# Patient Record
Sex: Male | Born: 1955 | Race: White | Hispanic: No | Marital: Married | Smoking: Former smoker
Health system: Southern US, Community
[De-identification: ages and names within clinical notes are randomized; demographics above are authoritative.]

## PROBLEM LIST (undated history)

## (undated) ENCOUNTER — Emergency Department: Admission: EM | Payer: Self-pay | Source: Home / Self Care

## (undated) DIAGNOSIS — I4891 Unspecified atrial fibrillation: Secondary | ICD-10-CM

## (undated) DIAGNOSIS — E785 Hyperlipidemia, unspecified: Secondary | ICD-10-CM

## (undated) DIAGNOSIS — I2510 Atherosclerotic heart disease of native coronary artery without angina pectoris: Secondary | ICD-10-CM

## (undated) DIAGNOSIS — G20 Parkinson's disease: Secondary | ICD-10-CM

## (undated) DIAGNOSIS — E538 Deficiency of other specified B group vitamins: Secondary | ICD-10-CM

## (undated) DIAGNOSIS — G20A1 Parkinson's disease without dyskinesia, without mention of fluctuations: Secondary | ICD-10-CM

## (undated) DIAGNOSIS — F419 Anxiety disorder, unspecified: Secondary | ICD-10-CM

## (undated) HISTORY — DX: Unspecified atrial fibrillation: I48.91

## (undated) HISTORY — PX: CARDIAC CATHETERIZATION: SHX172

---

## 2020-07-15 ENCOUNTER — Emergency Department: Payer: 59

## 2020-07-15 ENCOUNTER — Encounter: Payer: Self-pay | Admitting: Emergency Medicine

## 2020-07-15 ENCOUNTER — Emergency Department
Admission: EM | Admit: 2020-07-15 | Discharge: 2020-07-15 | Disposition: A | Discharge status: Skilled Nursing Facility | Payer: 59 | Attending: Emergency Medicine | Admitting: Emergency Medicine

## 2020-07-15 ENCOUNTER — Other Ambulatory Visit: Payer: Self-pay

## 2020-07-15 DIAGNOSIS — Y939 Activity, unspecified: Secondary | ICD-10-CM | POA: Insufficient documentation

## 2020-07-15 DIAGNOSIS — Z87891 Personal history of nicotine dependence: Secondary | ICD-10-CM | POA: Insufficient documentation

## 2020-07-15 DIAGNOSIS — Z79899 Other long term (current) drug therapy: Secondary | ICD-10-CM | POA: Diagnosis not present

## 2020-07-15 DIAGNOSIS — Y929 Unspecified place or not applicable: Secondary | ICD-10-CM | POA: Diagnosis not present

## 2020-07-15 DIAGNOSIS — R410 Disorientation, unspecified: Secondary | ICD-10-CM

## 2020-07-15 DIAGNOSIS — Y999 Unspecified external cause status: Secondary | ICD-10-CM | POA: Diagnosis not present

## 2020-07-15 DIAGNOSIS — F329 Major depressive disorder, single episode, unspecified: Secondary | ICD-10-CM | POA: Diagnosis not present

## 2020-07-15 DIAGNOSIS — I2510 Atherosclerotic heart disease of native coronary artery without angina pectoris: Secondary | ICD-10-CM | POA: Insufficient documentation

## 2020-07-15 DIAGNOSIS — R4182 Altered mental status, unspecified: Secondary | ICD-10-CM | POA: Diagnosis present

## 2020-07-15 DIAGNOSIS — W06XXXA Fall from bed, initial encounter: Secondary | ICD-10-CM | POA: Diagnosis not present

## 2020-07-15 DIAGNOSIS — F12121 Cannabis abuse with intoxication delirium: Secondary | ICD-10-CM | POA: Insufficient documentation

## 2020-07-15 DIAGNOSIS — F12921 Cannabis use, unspecified with intoxication delirium: Secondary | ICD-10-CM

## 2020-07-15 DIAGNOSIS — F32A Depression, unspecified: Secondary | ICD-10-CM

## 2020-07-15 DIAGNOSIS — R000 Tachycardia, unspecified: Secondary | ICD-10-CM | POA: Insufficient documentation

## 2020-07-15 HISTORY — DX: Parkinson's disease without dyskinesia, without mention of fluctuations: G20.A1

## 2020-07-15 HISTORY — DX: Atherosclerotic heart disease of native coronary artery without angina pectoris: I25.10

## 2020-07-15 HISTORY — DX: Parkinson's disease: G20

## 2020-07-15 LAB — CBC WITH DIFFERENTIAL/PLATELET
Abs Immature Granulocytes: 0.02 10*3/uL (ref 0.00–0.07)
Basophils Absolute: 0 10*3/uL (ref 0.0–0.1)
Basophils Relative: 0 %
Eosinophils Absolute: 0 10*3/uL (ref 0.0–0.5)
Eosinophils Relative: 0 %
HCT: 41.8 % (ref 39.0–52.0)
Hemoglobin: 13.5 g/dL (ref 13.0–17.0)
Immature Granulocytes: 0 %
Lymphocytes Relative: 10 %
Lymphs Abs: 0.8 10*3/uL (ref 0.7–4.0)
MCH: 29.3 pg (ref 26.0–34.0)
MCHC: 32.3 g/dL (ref 30.0–36.0)
MCV: 90.7 fL (ref 80.0–100.0)
Monocytes Absolute: 0.5 10*3/uL (ref 0.1–1.0)
Monocytes Relative: 6 %
Neutro Abs: 7 10*3/uL (ref 1.7–7.7)
Neutrophils Relative %: 84 %
Platelets: 238 10*3/uL (ref 150–400)
RBC: 4.61 MIL/uL (ref 4.22–5.81)
RDW: 13.2 % (ref 11.5–15.5)
WBC: 8.4 10*3/uL (ref 4.0–10.5)
nRBC: 0 % (ref 0.0–0.2)

## 2020-07-15 LAB — URINE DRUG SCREEN, QUALITATIVE (ARMC ONLY)
Amphetamines, Ur Screen: NOT DETECTED
Barbiturates, Ur Screen: NOT DETECTED
Benzodiazepine, Ur Scrn: NOT DETECTED
Cannabinoid 50 Ng, Ur ~~LOC~~: POSITIVE — AB
Cocaine Metabolite,Ur ~~LOC~~: NOT DETECTED
MDMA (Ecstasy)Ur Screen: NOT DETECTED
Methadone Scn, Ur: NOT DETECTED
Opiate, Ur Screen: NOT DETECTED
Phencyclidine (PCP) Ur S: NOT DETECTED
Tricyclic, Ur Screen: NOT DETECTED

## 2020-07-15 LAB — URINALYSIS, COMPLETE (UACMP) WITH MICROSCOPIC
Bacteria, UA: NONE SEEN
Bilirubin Urine: NEGATIVE
Glucose, UA: NEGATIVE mg/dL
Hgb urine dipstick: NEGATIVE
Ketones, ur: NEGATIVE mg/dL
Leukocytes,Ua: NEGATIVE
Nitrite: NEGATIVE
Protein, ur: NEGATIVE mg/dL
Specific Gravity, Urine: 1.015 (ref 1.005–1.030)
Squamous Epithelial / HPF: NONE SEEN (ref 0–5)
pH: 7 (ref 5.0–8.0)

## 2020-07-15 LAB — COMPREHENSIVE METABOLIC PANEL
ALT: 6 U/L (ref 0–44)
AST: 14 U/L — ABNORMAL LOW (ref 15–41)
Albumin: 4 g/dL (ref 3.5–5.0)
Alkaline Phosphatase: 63 U/L (ref 38–126)
Anion gap: 11 (ref 5–15)
BUN: 18 mg/dL (ref 8–23)
CO2: 28 mmol/L (ref 22–32)
Calcium: 9.3 mg/dL (ref 8.9–10.3)
Chloride: 101 mmol/L (ref 98–111)
Creatinine, Ser: 0.81 mg/dL (ref 0.61–1.24)
GFR calc Af Amer: >60 mL/min (ref >60)
GFR calc non Af Amer: >60 mL/min (ref >60)
Glucose, Bld: 107 mg/dL — ABNORMAL HIGH (ref 70–99)
Potassium: 3.8 mmol/L (ref 3.5–5.1)
Sodium: 140 mmol/L (ref 135–145)
Total Bilirubin: 1 mg/dL (ref 0.3–1.2)
Total Protein: 7.3 g/dL (ref 6.5–8.1)

## 2020-07-15 LAB — LACTIC ACID, PLASMA: Lactic Acid, Venous: 1.2 mmol/L (ref 0.5–1.9)

## 2020-07-15 LAB — TROPONIN I (HIGH SENSITIVITY): Troponin I (High Sensitivity): 6 ng/L (ref <18)

## 2020-07-15 MED ORDER — SODIUM CHLORIDE 0.9 % IV BOLUS
1000.0000 mL | Freq: Once | INTRAVENOUS | Status: AC
  Administered 2020-07-15: 1000 mL via INTRAVENOUS

## 2020-07-15 NOTE — ED Notes (Signed)
C-COM called for transport back to [facility]

## 2020-07-15 NOTE — ED Provider Notes (Signed)
[facility] Emergency Department Provider Note ____________________________________________   First MD Initiated Contact with Patient 07/15/20 [phone]     (approximate)  I have reviewed the triage vital signs and the nursing notes.  HISTORY  Chief Complaint Altered Mental Status   HPI Corey Morrison is a 64 y.o. malewho presents to the ED for evaluation of altered mental status  Chart review indicates patient has never been in her system.  He reports a history of [name]'s disease.  Documentation from patient's facility indicates that they found a packet of 50 marijuana edible Gummies in his room yesterday morning, 5 for left in the pack of 50.  Patient reportedly found this morning altered at his facility.  They report via EMS that they want a psychiatric evaluation.  Patient initially provides very limited history due to altered mental status.  This limits my initial evaluation.  Later, as indicated below within my timestamp section, patient provides additional history about how he has been eating marijuana edibles, how he accidentally fell out of bed overnight last night.  Patient reports rolling out of bed accidentally, hitting his head on the floor.  He denies associated syncope.  Denies headache at this time.  Denies vision changes.  Later, patient is reporting vague suicidality without a plan.  Past Medical History:  Diagnosis Date  . Coronary artery disease   . [name] disease (HCC)     There are no problems to display for this patient.   History reviewed. No pertinent surgical history.  Prior to Admission medications   Medication Sig Start Date End Date Taking? Authorizing Provider  atorvastatin (LIPITOR) 40 MG tablet Take 40 mg by mouth daily.   Yes [provider]  Carbidopa-Levodopa ER (RYTARY) 61.25-245 MG CPCR Take by mouth.   Yes [provider]  prasugrel (EFFIENT) 5 MG TABS tablet Take 5 mg by mouth daily.   Yes  [provider]  ropinirole (REQUIP) 5 MG tablet Take 5 mg by mouth at bedtime.   Yes [provider]    Allergies Patient has no known allergies.  History reviewed. No pertinent family history.  Social History Social History   Tobacco Use  . Smoking status: Former Games developer  . Smokeless tobacco: Never Used  Substance Use Topics  . Alcohol use: Not on file  . Drug use: Not on file    Review of Systems  Unable to be obtained during my initial evaluation due to patient's altered mental status. ____________________________________________   PHYSICAL EXAM:  VITAL SIGNS: Vitals:   07/15/20 0908 07/15/20 1237  BP: 128/78 (!) 161/83  Pulse: (!) 101 97  Resp: 18 13  Temp: 98 F (36.7 C)   SpO2: 98% 100%      Constitutional: Alert.  Sitting up in bed, well-appearing without distress.  Follows commands in all 4 extremities.  Nonverbal upon my initial evaluation, and upon subsequent evaluations patient is sitting up and conversational in full sentences.. Eyes: Conjunctivae are normal. PERRL. EOMI. Head: Atraumatic. Nose: No congestion/rhinnorhea. Mouth/Throat: Mucous membranes are moist.  Oropharynx non-erythematous. Neck: No stridor. No cervical spine tenderness to palpation. Cardiovascular: Normal rate, regular rhythm. Grossly normal heart sounds.  Good peripheral circulation. Respiratory: Normal respiratory effort.  No retractions. Lungs CTAB. Gastrointestinal: Soft , nondistended, nontender to palpation. No abdominal bruits. No CVA tenderness. Musculoskeletal: No lower extremity tenderness nor edema.  No joint effusions. No signs of acute trauma. Slow movements and cogwheel rigidity is present. Neurologic:  Normal speech and language. No  gross focal neurologic deficits are appreciated. No gait instability noted. Cranial nerves II through XII intact 5/5 strength and sensation in all 4 extremities Skin:  Skin is warm, dry and intact. No rash  noted. Psychiatric: Mood and affect are normal. Speech and behavior are normal.  ____________________________________________   LABS (all labs ordered are listed, but only abnormal results are displayed)  Labs Reviewed  COMPREHENSIVE METABOLIC PANEL - Abnormal; Notable for the following components:      Result Value   Glucose, Bld 107 (*)    AST 14 (*)    All other components within normal limits  URINALYSIS, COMPLETE (UACMP) WITH MICROSCOPIC - Abnormal; Notable for the following components:   Color, Urine YELLOW (*)    APPearance HAZY (*)    All other components within normal limits  URINE DRUG SCREEN, QUALITATIVE ([facility] ONLY) - Abnormal; Notable for the following components:   Cannabinoid 50 Ng, Ur [place] POSITIVE (*)    All other components within normal limits  LACTIC ACID, PLASMA  CBC WITH DIFFERENTIAL/PLATELET  CBC WITH DIFFERENTIAL/PLATELET  TROPONIN I (HIGH SENSITIVITY)   ____________________________________________  12 Lead EKG  Sinus rhythm, rate of 107 bpm, normal axis.  LBBB.  No evidence of acute ischemia per Sgarbossa's criteria ____________________________________________  RADIOLOGY  ED MD interpretation: No evidence of acute cardiopulmonary pathology on CXR  Official radiology report(s): CT Head Wo Contrast  Result Date: 07/15/2020 CLINICAL DATA:  Altered mental status EXAM: CT HEAD WITHOUT CONTRAST TECHNIQUE: Contiguous axial images were obtained from the base of the skull through the vertex without intravenous contrast. COMPARISON:  None. FINDINGS: Brain: The ventricles are normal in size and configuration. There is slight frontal atrophy bilaterally, symmetric. There is no intracranial mass, hemorrhage, extra-axial fluid collection, or midline shift. Brain parenchyma appears unremarkable. No demonstrable acute infarct. Vascular: No evident hyperdense vessel. There is calcification in the left carotid siphon region. Skull: The bony calvarium appears intact.  Sinuses/Orbits: There is mild mucosal thickening in several ethmoid air cells. Other visualized paranasal sinuses are clear. Orbits appear symmetric bilaterally. Other: Mastoid air cells are clear. IMPRESSION: Slight frontal atrophy bilaterally. Ventricles normal in size and configuration. Brain parenchyma appears unremarkable. No mass or hemorrhage. Mild arterial vascular calcification. Mild mucosal thickening in several ethmoid air cells. Electronically Signed   By: Bretta Bang III M.D.   On: 07/15/2020 09:36   DG Chest Portable 1 View  Result Date: 07/15/2020 CLINICAL DATA:  Tachycardia with altered mental status EXAM: PORTABLE CHEST 1 VIEW COMPARISON:  None. FINDINGS: Lungs are clear. Heart size and pulmonary vascularity are normal. No adenopathy. There is an old healed fracture of the posterior right seventh rib. No pneumothorax. IMPRESSION: Lungs clear.  Cardiac silhouette normal. Electronically Signed   By: Bretta Bang III M.D.   On: 07/15/2020 09:44    ____________________________________________   PROCEDURES and INTERVENTIONS  Procedure(s) performed (including Critical Care):  Procedures  Medications  sodium chloride 0.9 % bolus 1,000 mL (0 mLs Intravenous Stopped 07/15/20 1107)    ____________________________________________   INITIAL IMPRESSION / ASSESSMENT AND PLAN / ED COURSE  64 year old male with [name]'s been to the ED for evaluation of altered mental status, likely due to cannabis intoxication, subsequently telling us he has vague suicidality without a plan requiring psychiatric evaluation.  Normal vital signs on room air.  Exam initially with mute patient who has no evidence of distress, trauma or focal neurologic deficits.  Blood work unremarkable without evidence of acute derangements.  Urine testing demonstrates UDS  positive for cannabis, consistent with story that we received prehospital of marijuana edibles being found on the patient this morning.  CXR  and CT head without evidence of acute pathology.  Patient's mental status clearing while he is in the ED and he eventually does describe some vague suicidality due to his decreased capabilities in the setting of [name]'s disease.  He has no plan and has never attempted suicide in the past, but he is requesting evaluation by psychiatry, to think is reasonable.  Due to his lack of plan and only vague thoughts, IVC was not taken out on this patient.  Patient signed out to oncoming physician Dr. Larinda Buttery to follow-up on evaluation by psychiatry/TTS of patient's mental status.  Medically, he is cleared and safe for discharge per my evaluation.  Clinical Course as of Jul 15 1526  Wed Jul 15, 2020  1011 Reassessed.  Patient sitting up in bed and has sharper mentation.  He reports falling out of bed this morning and hitting his head.  Reports eating marijuana edibles last night and the night before.   [DS]  1139 Patient requesting psychiatric evaluation.  TTS/psychiatry orders placed.  Medically clear from my perspective.   [DS]  1346 Reassessed.  Still waiting for psychiatry/TTS, I go to the bedside and discuss his concerns.  While he was initially not given me much information to work with regarding why he wanted to talk to them, reports that he has been having some vague suicidality over the past few weeks.  Reports thinking about suicide almost daily now, but has formulated no plan.   [DS]    Clinical Course User Index [DS] Delton Prairie, MD     ____________________________________________   FINAL CLINICAL IMPRESSION(S) / ED DIAGNOSES  Final diagnoses:  Confusion  Cannabis intoxication with delirium (HCC)  Depression, unspecified depression type     ED Discharge Orders    None       Jaloni Sorber   Note:  This document was prepared using Dragon voice recognition software and may include unintentional dictation errors.   Delton Prairie, MD 07/15/20 [phone]

## 2020-07-15 NOTE — ED Notes (Signed)
Pt requesting to speak to psychiatrist. Pt denies SI/HI at this time. MD made aware. No new orders at this time

## 2020-07-15 NOTE — ED Notes (Signed)
C-COM called for transport back to [facility] 

## 2020-07-15 NOTE — ED Notes (Signed)
This RN unable to find psych MD or reach him by phone at this time

## 2020-07-15 NOTE — ED Notes (Signed)
This RN at bedside. Pt asking when psych will be to see him. This RN assured made the psychologist is aware of pt's situation and will be in as soon as he can. Pt verbalizes understanding.

## 2020-07-15 NOTE — ED Provider Notes (Signed)
-----------------------------------------   3:10 PM on 07/15/2020 -----------------------------------------  Blood pressure (!) 161/83, pulse 97, temperature 98 F (36.7 C), temperature source Oral, resp. rate 13, height 5\' 9"  (1.753 m), weight 65.8 kg, SpO2 100 %.  Assuming care from Dr. .  In short, Corey Morrison is a 64 y.o. male with a chief complaint of Altered Mental Status .  Refer to the original H&P for additional details.  The current plan of care is to follow-up psychiatry recommendations for passive SI.  ----------------------------------------- 4:52 PM on 07/15/2020 -----------------------------------------  On reassessment, patient is now requesting to be discharged home.  He currently denies any suicidal ideation, states "it was a brief thing, but I want to live."  He was offered psychiatric evaluation, but declines at this time and given he does not represent an acute threat to himself or others, he is appropriate for discharge home.  He was advised to avoid marijuana in the future due to this episode.  He was provided with referral to [facility] and counseled to return to the ED for new worsening symptoms, patient agrees with plan.    07/17/2020, MD 07/15/20 [phone]

## 2020-07-15 NOTE — ED Notes (Signed)
This RN and Joni Reining, EDT at bedside assisting pt to bathroom. Psych MD stating he would could come back once done.

## 2020-07-15 NOTE — ED Notes (Signed)
Pt given meal tray and water at this time 

## 2020-07-15 NOTE — ED Triage Notes (Signed)
Presents via EMS from [facility]  Per EMS the staff found him altered    Speech is muffled at presents but will answer questions

## 2020-07-15 NOTE — ED Notes (Signed)
This RN visualized pt leaving room and trying to use fire exit door. This RN and Val, RN assisted pt back to bed. Pt with very unsteady gait. This RN and Val, RN had to to prevent pt from falling twice. Pt high fall risk. Charge RN called to move pt to 12H for better visualization until EMS can transport pt.

## 2021-04-03 IMAGING — CR DG CHEST 1V PORT
1 series · 1 of 1 positions shown · non-contrast
Comparison: None.

CLINICAL DATA: Tachycardia with altered mental status

EXAM:
PORTABLE CHEST 1 VIEW

[x chest ap]
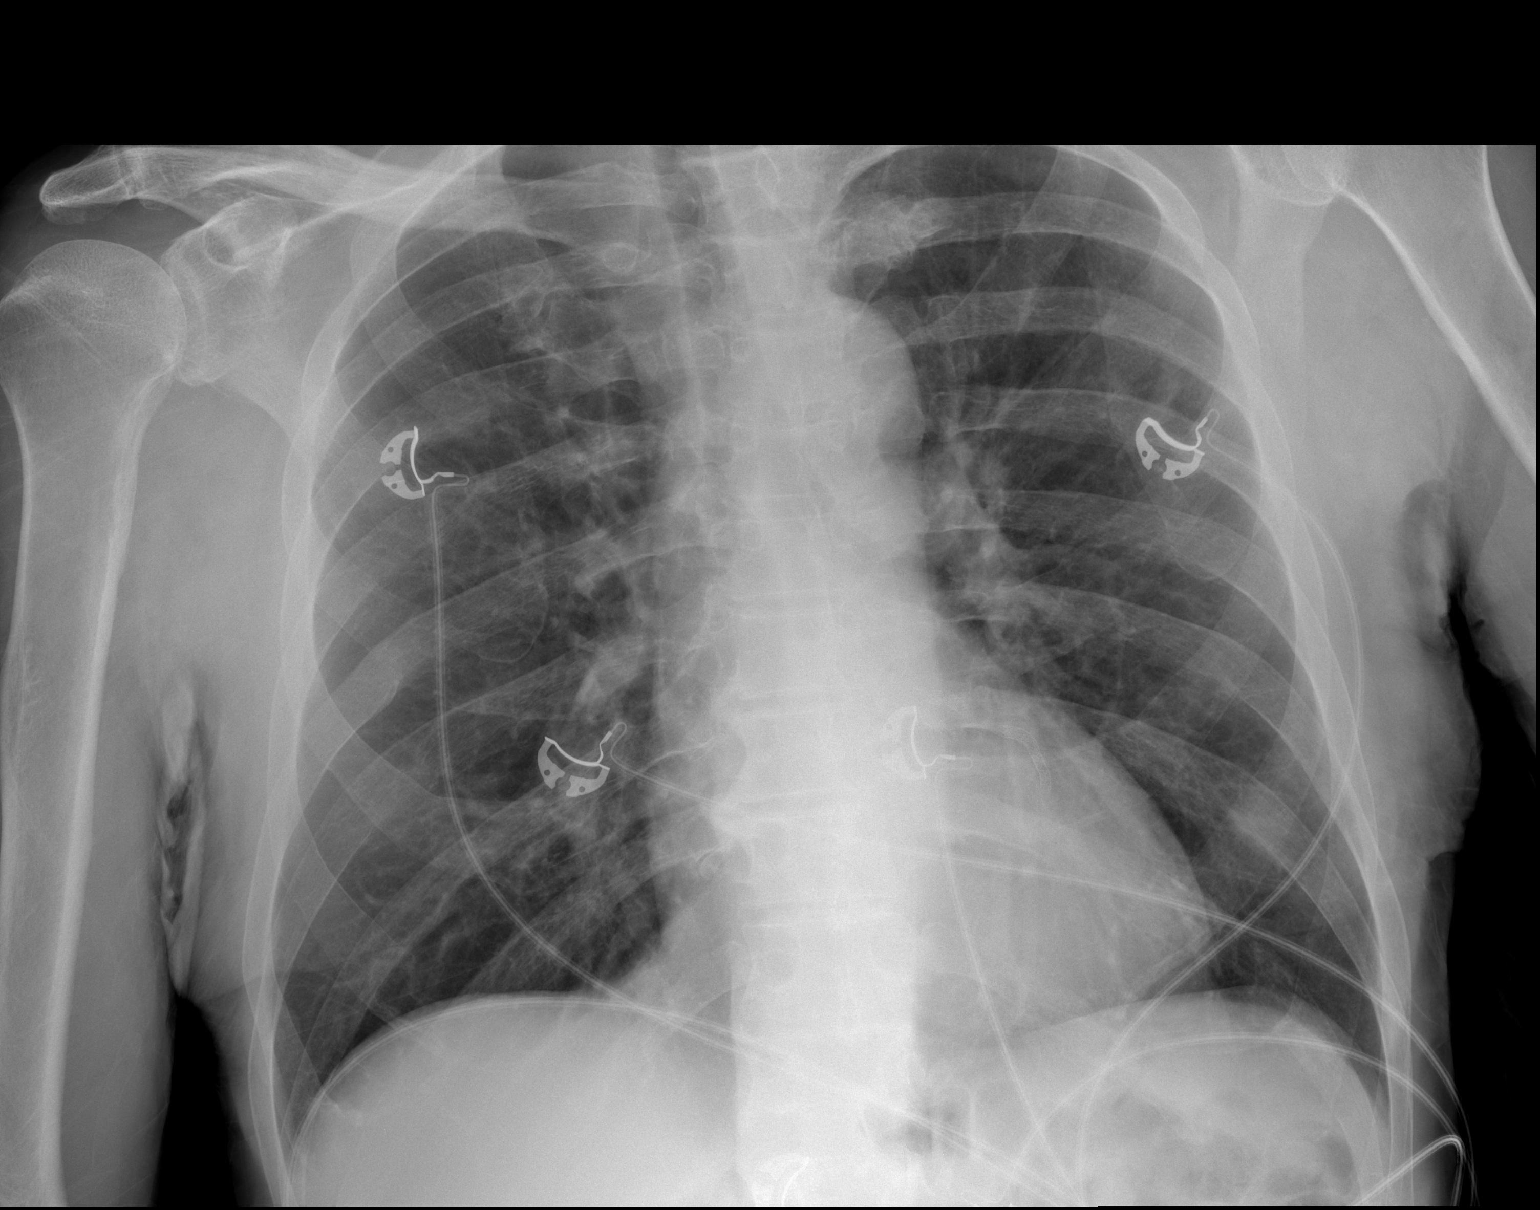

[1 of 1 positions shown; findings below may reference images not displayed]

FINDINGS: Lungs are clear. Heart size and pulmonary vascularity are normal. No
adenopathy. There is an old healed fracture of the posterior right
seventh rib. No pneumothorax.
IMPRESSION: Lungs clear.  Cardiac silhouette normal.

## 2021-07-26 ENCOUNTER — Ambulatory Visit (INDEPENDENT_AMBULATORY_CARE_PROVIDER_SITE_OTHER): Payer: Medicare Other | Admitting: Cardiology

## 2021-07-26 ENCOUNTER — Encounter: Payer: Self-pay | Admitting: Cardiology

## 2021-07-26 ENCOUNTER — Other Ambulatory Visit: Payer: Self-pay

## 2021-07-26 VITALS — BP 111/45 | HR 79 | Ht 69.0 in | Wt 113.2 lb

## 2021-07-26 DIAGNOSIS — I2510 Atherosclerotic heart disease of native coronary artery without angina pectoris: Secondary | ICD-10-CM

## 2021-07-26 DIAGNOSIS — G249 Dystonia, unspecified: Secondary | ICD-10-CM

## 2021-07-26 DIAGNOSIS — E7800 Pure hypercholesterolemia, unspecified: Secondary | ICD-10-CM | POA: Diagnosis not present

## 2021-07-26 MED ORDER — EZETIMIBE 10 MG PO TABS: 10.0000 mg | ORAL_TABLET | Freq: Every day | ORAL | 11 refills | Status: DC

## 2021-07-26 NOTE — Progress Notes (Signed)
Cardiology Office Note:    Date:  07/26/2021   ID:  Corey Morrison, DOB 1956/06/28, MRN [id number]  PCP:  Almetta Lovely, [facility]   [facility] Providers Cardiologist:  None     Referring MD: Melonie Florida, FNP   Chief Complaint  Patient presents with   Other    Hx ASCVD please discuss d/c statin drug. Meds reviewed verbally with pt.    History of Present Illness:    Corey Morrison is a 65 y.o. male with a hx of CAD/PCI (DES x 2 to prox and mid LAD 05/2020), hyperlipidemia, former smoker, [name]'s presenting due to history of CAD.  Previously seen at [facility] in [place] from a cardiac perspective.  April 2021 he was noted to have an abnormal stress test.  Underwent a coronary CTA which showed mid LAD stenosis.  Left heart cath 06/05/2020 showed significant stenosis in the mid LAD.  Notes indicate a history of atrial fibrillation, not on anticoagulation probably because of fall risk.  Echocardiogram 02/2020 showed normal EF 57%.  He currently resides in a skilled nursing facility.  He is being followed by neurology, has several neurologic medications being used for [name]'s, dyskinesia.  Was told he needed to stop statin due to drug drug interaction.  Was told he needs to stop his other cardiac medications.  He currently takes 3 medicines from a cardiac perspective including aspirin, Effient, Lipitor.  Past Medical History:  Diagnosis Date   A-fib [facility])    Coronary artery disease    [name] disease (HCC)     Past Surgical History:  Procedure Laterality Date   CARDIAC CATHETERIZATION      Current Medications: Current Meds  Medication Sig   acetaminophen (TYLENOL) 325 MG tablet TAKE 2 TABLETS BY MOUTH EVERY 6 HOURS ASNEEDED FOR PAIN   aspirin 81 MG EC tablet Take 1 tablet by mouth daily.   Carbidopa-Levodopa ER (RYTARY) 61.25-245 MG CPCR Take by mouth.   citalopram (CELEXA) 10 MG tablet Take 10 mg for one week, then increase to 20 mg for one week, then 30 mg  for one week, then 40 mg and continue.   entacapone (COMTAN) 200 MG tablet Take by mouth.   ezetimibe (ZETIA) 10 MG tablet Take 1 tablet (10 mg total) by mouth daily.   QUEtiapine (SEROQUEL) 25 MG tablet Take by mouth.   ropinirole (REQUIP) 5 MG tablet Take 5 mg by mouth at bedtime.   [DISCONTINUED] atorvastatin (LIPITOR) 40 MG tablet Take 40 mg by mouth daily.   [DISCONTINUED] prasugrel (EFFIENT) 5 MG TABS tablet Take 5 mg by mouth daily.     Allergies:   Patient has no known allergies.   Social History   Socioeconomic History   Marital status: Married    Spouse name: Not on file   Number of children: Not on file   Years of education: Not on file   Highest education level: Not on file  Occupational History   Not on file  Tobacco Use   Smoking status: Former   Smokeless tobacco: Never  Substance and Sexual Activity   Alcohol use: Never   Drug use: Never   Sexual activity: Not Currently  Other Topics Concern   Not on file  Social History Narrative   Not on file   Social Determinants of Health   Financial Resource Strain: Not on file  Food Insecurity: Not on file  Transportation Needs: Not on file  Physical Activity: Not on file  Stress: Not on file  Social Connections: Not on file     Family History: The patient's family history includes Heart Problems in his father.  ROS:   Please see the history of present illness.     All other systems reviewed and are negative.  EKGs/Labs/Other Studies Reviewed:    The following studies were reviewed today:   EKG:  EKG is  ordered today.  The ekg ordered today demonstrates sinus rhythm, bundle branch block  Recent Labs: No results found for requested labs within last 8760 hours.  Recent Lipid Panel No results found for: CHOL, TRIG, HDL, CHOLHDL, VLDL, LDLCALC, LDLDIRECT   Risk Assessment/Calculations:          Physical Exam:    VS:  BP (!) 111/45 (BP Location: Left Arm, Patient Position: Sitting, Cuff Size:  Normal)   Pulse 79   Ht 5\' 9"  (1.753 m)   Wt 113 lb 4 oz (51.4 kg)   SpO2 96%   BMI 16.72 kg/m     Wt Readings from Last 3 Encounters:  07/26/21 113 lb 4 oz (51.4 kg)  07/15/20 145 lb (65.8 kg)     GEN: Dyskinesis severe, appears cachectic HEENT: Normal NECK: No JVD; No carotid bruits LYMPHATICS: No lymphadenopathy CARDIAC: RRR, no murmurs, rubs, gallops RESPIRATORY:  Clear to auscultation without rales, wheezing or rhonchi  ABDOMEN: Soft, non-tender, non-distended MUSCULOSKELETAL:  No edema; No deformity  SKIN: Warm and dry NEUROLOGIC:  Alert, choreiform movements PSYCHIATRIC: Disconnected,  ASSESSMENT:    1. Coronary artery disease involving native coronary artery of native heart without angina pectoris   2. Pure hypercholesterolemia   3. Dyskinesia    PLAN:    In order of problems listed above:  CAD/PCI x2 to mid LAD.  Continue aspirin, okay to stop Effient.  Request to stop statin for neurology due to drug interactions.  Okay to stop Lipitor, start Zetia 10 mg daily.  Last echo with preserved ejection fraction, EF 57%. Hyperlipidemia, start Zetia as above. Dyskinesia, choreiform movements.  Management as per neurology.  Follow-up in 1 year after repeat echocardiogram.     Medication Adjustments/Labs and Tests Ordered: Current medicines are reviewed at length with the patient today.  Concerns regarding medicines are outlined above.  Orders Placed This Encounter  Procedures   EKG 12-Lead   ECHOCARDIOGRAM COMPLETE   Meds ordered this encounter  Medications   ezetimibe (ZETIA) 10 MG tablet    Sig: Take 1 tablet (10 mg total) by mouth daily.    Dispense:  30 tablet    Refill:  11    Patient Instructions  Medication Instructions:   Your physician has recommended you make the following change in your medication:    STOP taking your Prasurgel (Effient)  2.    STOP taking Lipitor (Atorvastatin)  3.    START taking Zetia (Ezetimibe) 10 MG once a  day.  *If you need a refill on your cardiac medications before your next appointment, please call your pharmacy*   Lab Work: None ordered If you have labs (blood work) drawn today and your tests are completely normal, you will receive your results only by: MyChart Message (if you have MyChart) OR A paper copy in the mail If you have any lab test that is abnormal or we need to change your treatment, we will call you to review the results.   Testing/Procedures:  Your physician has requested that you have an echocardiogram in 1 year. Echocardiography is a painless test that uses sound waves to create  images of your heart. It provides your doctor with information about the size and shape of your heart and how well your heart's chambers and valves are working. This procedure takes approximately one hour. There are no restrictions for this procedure.    Follow-Up: At [facility], you and your health needs are our priority.  As part of our continuing mission to provide you with exceptional heart care, we have created designated Provider Care Teams.  These Care Teams include your primary Cardiologist (physician) and Advanced Practice Providers (APPs -  Physician Assistants and Nurse Practitioners) who all work together to provide you with the care you need, when you need it.  We recommend signing up for the patient portal called "MyChart".  Sign up information is provided on this After Visit Summary.  MyChart is used to connect with patients for Virtual Visits (Telemedicine).  Patients are able to view lab/test results, encounter notes, upcoming appointments, etc.  Non-urgent messages can be sent to your provider as well.   To learn more about what you can do with MyChart, go to [email or web address].    Your next appointment:   1 year(s)  The format for your next appointment:   In Person  Provider:   Debbe Odea, MD   Other Instructions    Signed, Debbe Odea, MD   07/26/2021 5:15 PM    [facility]

## 2021-07-26 NOTE — Patient Instructions (Signed)
Medication Instructions:   Your physician has recommended you make the following change in your medication:    STOP taking your Prasurgel (Effient)  2.    STOP taking Lipitor (Atorvastatin)  3.    START taking Zetia (Ezetimibe) 10 MG once a day.  *If you need a refill on your cardiac medications before your next appointment, please call your pharmacy*   Lab Work: None ordered If you have labs (blood work) drawn today and your tests are completely normal, you will receive your results only by: MyChart Message (if you have MyChart) OR A paper copy in the mail If you have any lab test that is abnormal or we need to change your treatment, we will call you to review the results.   Testing/Procedures:  Your physician has requested that you have an echocardiogram in 1 year. Echocardiography is a painless test that uses sound waves to create images of your heart. It provides your doctor with information about the size and shape of your heart and how well your heart's chambers and valves are working. This procedure takes approximately one hour. There are no restrictions for this procedure.    Follow-Up: At [facility], you and your health needs are our priority.  As part of our continuing mission to provide you with exceptional heart care, we have created designated Provider Care Teams.  These Care Teams include your primary Cardiologist (physician) and Advanced Practice Providers (APPs -  Physician Assistants and Nurse Practitioners) who all work together to provide you with the care you need, when you need it.  We recommend signing up for the patient portal called "MyChart".  Sign up information is provided on this After Visit Summary.  MyChart is used to connect with patients for Virtual Visits (Telemedicine).  Patients are able to view lab/test results, encounter notes, upcoming appointments, etc.  Non-urgent messages can be sent to your provider as well.   To learn more about what you  can do with MyChart, go to [email or web address].    Your next appointment:   1 year(s)  The format for your next appointment:   In Person  Provider:   Debbe Odea, MD   Other Instructions

## 2021-11-08 ENCOUNTER — Emergency Department
Admission: EM | Admit: 2021-11-08 | Discharge: 2021-11-09 | Disposition: A | Discharge status: Other Acute Inpatient Hospital | Payer: Medicare Other | Attending: Emergency Medicine | Admitting: Emergency Medicine

## 2021-11-08 ENCOUNTER — Other Ambulatory Visit: Payer: Self-pay

## 2021-11-08 DIAGNOSIS — Z7982 Long term (current) use of aspirin: Secondary | ICD-10-CM | POA: Diagnosis not present

## 2021-11-08 DIAGNOSIS — R440 Auditory hallucinations: Secondary | ICD-10-CM | POA: Insufficient documentation

## 2021-11-08 DIAGNOSIS — G20 Parkinson's disease: Secondary | ICD-10-CM | POA: Insufficient documentation

## 2021-11-08 DIAGNOSIS — R45851 Suicidal ideations: Secondary | ICD-10-CM | POA: Diagnosis not present

## 2021-11-08 DIAGNOSIS — Z79899 Other long term (current) drug therapy: Secondary | ICD-10-CM | POA: Diagnosis not present

## 2021-11-08 DIAGNOSIS — F29 Unspecified psychosis not due to a substance or known physiological condition: Secondary | ICD-10-CM | POA: Diagnosis present

## 2021-11-08 DIAGNOSIS — Z87891 Personal history of nicotine dependence: Secondary | ICD-10-CM | POA: Insufficient documentation

## 2021-11-08 DIAGNOSIS — Z20822 Contact with and (suspected) exposure to covid-19: Secondary | ICD-10-CM | POA: Insufficient documentation

## 2021-11-08 DIAGNOSIS — I2510 Atherosclerotic heart disease of native coronary artery without angina pectoris: Secondary | ICD-10-CM | POA: Diagnosis not present

## 2021-11-08 HISTORY — DX: Hyperlipidemia, unspecified: E78.5

## 2021-11-08 HISTORY — DX: Anxiety disorder, unspecified: F41.9

## 2021-11-08 HISTORY — DX: Deficiency of other specified B group vitamins: E53.8

## 2021-11-08 LAB — RESP PANEL BY RT-PCR (FLU A&B, COVID) ARPGX2
Influenza A by PCR: NEGATIVE
Influenza B by PCR: NEGATIVE
SARS Coronavirus 2 by RT PCR: NEGATIVE

## 2021-11-08 LAB — CBC
HCT: 31.1 % — ABNORMAL LOW (ref 39.0–52.0)
Hemoglobin: 9.8 g/dL — ABNORMAL LOW (ref 13.0–17.0)
MCH: 30.2 pg (ref 26.0–34.0)
MCHC: 31.5 g/dL (ref 30.0–36.0)
MCV: 95.7 fL (ref 80.0–100.0)
Platelets: 246 10*3/uL (ref 150–400)
RBC: 3.25 MIL/uL — ABNORMAL LOW (ref 4.22–5.81)
RDW: 15.2 % (ref 11.5–15.5)
WBC: 5.4 10*3/uL (ref 4.0–10.5)
nRBC: 0 % (ref 0.0–0.2)

## 2021-11-08 LAB — URINE DRUG SCREEN, QUALITATIVE (ARMC ONLY)
Amphetamines, Ur Screen: NOT DETECTED
Barbiturates, Ur Screen: NOT DETECTED
Benzodiazepine, Ur Scrn: NOT DETECTED
Cannabinoid 50 Ng, Ur ~~LOC~~: POSITIVE — AB
Cocaine Metabolite,Ur ~~LOC~~: NOT DETECTED
MDMA (Ecstasy)Ur Screen: NOT DETECTED
Methadone Scn, Ur: NOT DETECTED
Opiate, Ur Screen: NOT DETECTED
Phencyclidine (PCP) Ur S: NOT DETECTED
Tricyclic, Ur Screen: NOT DETECTED

## 2021-11-08 LAB — ETHANOL: Alcohol, Ethyl (B): <10 mg/dL (ref <10)

## 2021-11-08 LAB — COMPREHENSIVE METABOLIC PANEL
ALT: <5 U/L (ref 0–44)
AST: 14 U/L — ABNORMAL LOW (ref 15–41)
Albumin: 3.4 g/dL — ABNORMAL LOW (ref 3.5–5.0)
Alkaline Phosphatase: 104 U/L (ref 38–126)
Anion gap: 6 (ref 5–15)
BUN: 25 mg/dL — ABNORMAL HIGH (ref 8–23)
CO2: 29 mmol/L (ref 22–32)
Calcium: 8.6 mg/dL — ABNORMAL LOW (ref 8.9–10.3)
Chloride: 105 mmol/L (ref 98–111)
Creatinine, Ser: 0.74 mg/dL (ref 0.61–1.24)
GFR, Estimated: >60 mL/min (ref >60)
Glucose, Bld: 88 mg/dL (ref 70–99)
Potassium: 4 mmol/L (ref 3.5–5.1)
Sodium: 140 mmol/L (ref 135–145)
Total Bilirubin: 0.6 mg/dL (ref 0.3–1.2)
Total Protein: 6.7 g/dL (ref 6.5–8.1)

## 2021-11-08 LAB — ACETAMINOPHEN LEVEL: Acetaminophen (Tylenol), Serum: <10 ug/mL — ABNORMAL LOW (ref 10–30)

## 2021-11-08 LAB — SALICYLATE LEVEL: Salicylate Lvl: <7 mg/dL — ABNORMAL LOW (ref 7.0–30.0)

## 2021-11-08 MED ORDER — ASPIRIN EC 81 MG PO TBEC
81.0000 mg | DELAYED_RELEASE_TABLET | Freq: Every day | ORAL | Status: DC
  Administered 2021-11-09: 81 mg via ORAL
  Filled 2021-11-08: qty 1

## 2021-11-08 MED ORDER — EZETIMIBE 10 MG PO TABS
10.0000 mg | ORAL_TABLET | Freq: Every day | ORAL | Status: DC
  Administered 2021-11-09: 10 mg via ORAL
  Filled 2021-11-08: qty 1

## 2021-11-08 MED ORDER — CARBIDOPA-LEVODOPA 25-100 MG PO TABS
2.0000 | ORAL_TABLET | Freq: Once | ORAL | Status: AC
  Administered 2021-11-08: 2 via ORAL
  Filled 2021-11-08: qty 2

## 2021-11-08 MED ORDER — ENTACAPONE 200 MG PO TABS
200.0000 mg | ORAL_TABLET | Freq: Three times a day (TID) | ORAL | Status: DC
  Administered 2021-11-08 – 2021-11-09 (×3): 200 mg via ORAL
  Filled 2021-11-08 (×4): qty 1

## 2021-11-08 MED ORDER — PIMAVANSERIN TARTRATE 34 MG PO CAPS: 1.0000 | ORAL_CAPSULE | Freq: Every day | ORAL | Status: DC

## 2021-11-08 MED ORDER — SERTRALINE HCL 50 MG PO TABS
25.0000 mg | ORAL_TABLET | Freq: Every day | ORAL | Status: DC
  Administered 2021-11-08: 25 mg via ORAL
  Filled 2021-11-08: qty 1

## 2021-11-08 MED ORDER — PSYLLIUM 95 % PO PACK
1.0000 | PACK | Freq: Every day | ORAL | Status: DC
  Administered 2021-11-08: 1 via ORAL
  Filled 2021-11-08 (×2): qty 1

## 2021-11-08 MED ORDER — ROPINIROLE HCL 1 MG PO TABS
2.0000 mg | ORAL_TABLET | Freq: Four times a day (QID) | ORAL | Status: DC
  Administered 2021-11-08 – 2021-11-09 (×4): 2 mg via ORAL
  Filled 2021-11-08 (×7): qty 2

## 2021-11-08 MED ORDER — CARBIDOPA-LEVODOPA 25-100 MG PO TABS
2.0000 | ORAL_TABLET | ORAL | Status: DC
  Administered 2021-11-08 – 2021-11-09 (×5): 2 via ORAL
  Filled 2021-11-08 (×9): qty 2

## 2021-11-08 NOTE — ED Notes (Signed)
BIB friend/caregiver from [facility] due to concerns of patient acting like he was going to jump off of second floor. Pt describes as "a half ass attempt at suicide".

## 2021-11-08 NOTE — ED Notes (Signed)
Psych NP at bedside

## 2021-11-08 NOTE — ED Notes (Signed)
Snack and drink was given 

## 2021-11-08 NOTE — Consult Note (Signed)
[facility] Face-to-Face Psychiatry Consult   Reason for Consult:  suicidal thoughts Referring Physician:  Larinda Buttery Patient Identification: Corey Morrison MRN:  [id number] Principal Diagnosis: Verbal auditory hallucinations Diagnosis:  Principal Problem:   Verbal auditory hallucinations   Total Time spent with patient: 45 minutes  Subjective:   Corey Morrison is a 65 y.o. male patient admitted with suicidal thoughts in the context of AH telling him  that he is "no good."   HPI:    Past Psychiatric History: Patient was seen and chart reviewed.  Patient was brought in by a friend who stays with him in the evening.  [facility].  Patient has been diagnosed with [name] disease.  He states for the last several months he has been "hearing voices that tell me I am not good.  I also see people in my apartment."  Patient describes that there were a lot of people in his apartment that were walking around eating hamburgers.  Patient also hears voices when he tries to go to sleep at night and he says they will not stop until he gets up and starts banging dishes around.  Patient is not sure if this is real and this experience is very disturbing to him.  Today, patient was observed by staff trying to climb over a third story balcony in jumping because "the voices told me to."  Patient's states that he does not really want to die but he is afraid that these people are going to make him do it.  Patient describes that he has had very mild depression in the past, probably 20 years ago when he lived in [place], but he saw a therapist and has never been on any medicine for that until recently.  Patient sees Dr. Ardine [facility] neurology.  Patient denies any homicidal ideation but it frightens him to think that he may think somebody else is trying to harm him and he will do harm to them.  Patient seems to be aware that these occurrences of auditory and visual experiences are hallucinations and are  not real.  Patient requires psychiatric inpatient hospitalization for stabilization.  This is explained to the patient and his friend, goal-directed in agreement.  Pharmacy is working on reconciling the next medications with facility ([facility]).   Patient's friend, [name], [phone], was in attendance for evaluation.  Risk to Self:   Risk to Others:   Prior Inpatient Therapy:   Prior Outpatient Therapy:    Past Medical History:  Past Medical History:  Diagnosis Date   A-fib (HCC)    Anxiety disorder    Coronary artery disease    Hyperlipidemia    [name] disease (HCC)    Vitamin B12 deficiency     Past Surgical History:  Procedure Laterality Date   CARDIAC CATHETERIZATION     Family History:  Family History  Problem Relation Age of Onset   Heart Problems Father    Family Psychiatric  History: unknown Social History:  Social History   Substance and Sexual Activity  Alcohol Use Never     Social History   Substance and Sexual Activity  Drug Use Never    Social History   Socioeconomic History   Marital status: Married    Spouse name: Not on file   Number of children: Not on file   Years of education: Not on file   Highest education level: Not on file  Occupational History   Not on file  Tobacco Use  Smoking status: Former   Smokeless tobacco: Never  Substance and Sexual Activity   Alcohol use: Never   Drug use: Never   Sexual activity: Not Currently  Other Topics Concern   Not on file  Social History Narrative   Not on file   Social Determinants of Health   Financial Resource Strain: Not on file  Food Insecurity: Not on file  Transportation Needs: Not on file  Physical Activity: Not on file  Stress: Not on file  Social Connections: Not on file   Additional Social History:    Allergies:  No Known Allergies  Labs:  Results for orders placed or performed during the hospital encounter of 11/08/21 (from the past 48 hour(s))  Comprehensive  metabolic panel     Status: Abnormal   Collection Time: 11/08/21 11:38 AM  Result Value Ref Range   Sodium 140 135 - 145 mmol/L   Potassium 4.0 3.5 - 5.1 mmol/L   Chloride 105 98 - 111 mmol/L   CO2 29 22 - 32 mmol/L   Glucose, Bld 88 70 - 99 mg/dL    Comment: Glucose reference range applies only to samples taken after fasting for at least 8 hours.   BUN 25 (H) 8 - 23 mg/dL   Creatinine, Ser 1.01 0.61 - 1.24 mg/dL   Calcium 8.6 (L) 8.9 - 10.3 mg/dL   Total Protein 6.7 6.5 - 8.1 g/dL   Albumin 3.4 (L) 3.5 - 5.0 g/dL   AST 14 (L) 15 - 41 U/L   ALT <5 0 - 44 U/L   Alkaline Phosphatase 104 38 - 126 U/L   Total Bilirubin 0.6 0.3 - 1.2 mg/dL   GFR, Estimated >75 >10 mL/min    Comment: (NOTE) Calculated using the CKD-EPI Creatinine Equation (2021)    Anion gap 6 5 - 15    Comment: Performed at [facility], [address], [place], [place] [address]  Ethanol     Status: None   Collection Time: 11/08/21 11:38 AM  Result Value Ref Range   Alcohol, Ethyl (B) <10 <10 mg/dL    Comment: (NOTE) Lowest detectable limit for serum alcohol is 10 mg/dL.  For medical purposes only. Performed at [facility], [address] [place], [place] [address]   Salicylate level     Status: Abnormal   Collection Time: 11/08/21 11:38 AM  Result Value Ref Range   Salicylate Lvl <7.0 (L) 7.0 - 30.0 mg/dL    Comment: Performed at [facility], [address] [place], [place] [address]  Acetaminophen level     Status: Abnormal   Collection Time: 11/08/21 11:38 AM  Result Value Ref Range   Acetaminophen (Tylenol), Serum <10 (L) 10 - 30 ug/mL    Comment: (NOTE) Therapeutic concentrations vary significantly. A range of 10-30 ug/mL  may be an effective concentration for many patients. However, some  are best treated at concentrations outside of this range. Acetaminophen concentrations >150 ug/mL at 4 hours after ingestion  and >50 ug/mL at 12 hours after ingestion are often  associated with  toxic reactions.  Performed at [facility], [address] [place], [place] [address]   cbc     Status: Abnormal   Collection Time: 11/08/21 11:38 AM  Result Value Ref Range   WBC 5.4 4.0 - 10.5 K/uL   RBC 3.25 (L) 4.22 - 5.81 MIL/uL   Hemoglobin 9.8 (L) 13.0 - 17.0 g/dL   HCT 54.0 (L) 08.6 - 76.1 %  MCV 95.7 80.0 - 100.0 fL   MCH 30.2 26.0 - 34.0 pg   MCHC 31.5 30.0 - 36.0 g/dL   RDW 89.3 81.0 - 17.5 %   Platelets 246 150 - 400 K/uL   nRBC 0.0 0.0 - 0.2 %    Comment: Performed at [facility], [address] [place], [place] [address]  Urine Drug Screen, Qualitative     Status: Abnormal   Collection Time: 11/08/21 11:38 AM  Result Value Ref Range   Tricyclic, Ur Screen NONE DETECTED NONE DETECTED   Amphetamines, Ur Screen NONE DETECTED NONE DETECTED   MDMA (Ecstasy)Ur Screen NONE DETECTED NONE DETECTED   Cocaine Metabolite,Ur [place] NONE DETECTED NONE DETECTED   Opiate, Ur Screen NONE DETECTED NONE DETECTED   Phencyclidine (PCP) Ur S NONE DETECTED NONE DETECTED   Cannabinoid 50 Ng, Ur [place] POSITIVE (A) NONE DETECTED   Barbiturates, Ur Screen NONE DETECTED NONE DETECTED   Benzodiazepine, Ur Scrn NONE DETECTED NONE DETECTED   Methadone Scn, Ur NONE DETECTED NONE DETECTED    Comment: (NOTE) Tricyclics + metabolites, urine    Cutoff 1000 ng/mL Amphetamines + metabolites, urine  Cutoff 1000 ng/mL MDMA (Ecstasy), urine              Cutoff 500 ng/mL Cocaine Metabolite, urine          Cutoff 300 ng/mL Opiate + metabolites, urine        Cutoff 300 ng/mL Phencyclidine (PCP), urine         Cutoff 25 ng/mL Cannabinoid, urine                 Cutoff 50 ng/mL Barbiturates + metabolites, urine  Cutoff 200 ng/mL Benzodiazepine, urine              Cutoff 200 ng/mL Methadone, urine                   Cutoff 300 ng/mL  The urine drug screen provides only a preliminary, unconfirmed analytical test result and should not be used for non-medical purposes.  Clinical consideration and professional judgment should be applied to any positive drug screen result due to possible interfering substances. A more specific alternate chemical method must be used in order to obtain a confirmed analytical result. Gas chromatography / mass spectrometry (GC/MS) is the preferred confirm atory method. Performed at [facility], [address] [place], [place] [address]   Resp Panel by RT-PCR (Flu A&B, Covid) Nasopharyngeal Swab     Status: None   Collection Time: 11/08/21  3:38 PM   Specimen: Nasopharyngeal Swab; Nasopharyngeal(NP) swabs in vial transport medium  Result Value Ref Range   SARS Coronavirus 2 by RT PCR NEGATIVE NEGATIVE    Comment: (NOTE) SARS-CoV-2 target nucleic acids are NOT DETECTED.  The SARS-CoV-2 RNA is generally detectable in upper respiratory specimens during the acute phase of infection. The lowest concentration of SARS-CoV-2 viral copies this assay can detect is 138 copies/mL. A negative result does not preclude SARS-Cov-2 infection and should not be used as the sole basis for treatment or other patient management decisions. A negative result may occur with  improper specimen collection/handling, submission of specimen other than nasopharyngeal swab, presence of viral mutation(s) within the areas targeted by this assay, and inadequate number of viral copies(<138 copies/mL). A negative result must be combined with clinical observations, patient history, and epidemiological information. The expected result is Negative.  Fact Sheet for Patients:  BloggerCourse.com  Fact Sheet for Healthcare Providers:  SeriousBroker.it  This test is no t yet approved or cleared by the [place] and  has been authorized for detection and/or diagnosis of SARS-CoV-2 by FDA under an Emergency Use Authorization (EUA). This EUA will remain  in effect (meaning this test can be used)  for the duration of the COVID-19 declaration under Section 564(b)(1) of the Act, 21 U.S.C.section 360bbb-3(b)(1), unless the authorization is terminated  or revoked sooner.       Influenza A by PCR NEGATIVE NEGATIVE   Influenza B by PCR NEGATIVE NEGATIVE    Comment: (NOTE) The Xpert Xpress SARS-CoV-2/FLU/RSV plus assay is intended as an aid in the diagnosis of influenza from Nasopharyngeal swab specimens and should not be used as a sole basis for treatment. Nasal washings and aspirates are unacceptable for Xpert Xpress SARS-CoV-2/FLU/RSV testing.  Fact Sheet for Patients: BloggerCourse.com  Fact Sheet for Healthcare Providers: SeriousBroker.it  This test is not yet approved or cleared by the [place] FDA and has been authorized for detection and/or diagnosis of SARS-CoV-2 by FDA under an Emergency Use Authorization (EUA). This EUA will remain in effect (meaning this test can be used) for the duration of the COVID-19 declaration under Section 564(b)(1) of the Act, 21 U.S.C. section 360bbb-3(b)(1), unless the authorization is terminated or revoked.  Performed at [facility], [address] [place], [place] [address]     Current Facility-Administered Medications  Medication Dose Route Frequency Provider Last Rate Last Admin   [START ON 11/09/2021] aspirin EC tablet 81 mg  81 mg Oral Daily Chesley Noon, MD       carbidopa-levodopa (SINEMET IR) 25-100 MG per tablet immediate release 2 tablet  2 tablet Oral 7 times per day Chesley Noon, MD       entacapone Coralie Carpen) tablet 200 mg  200 mg Oral TID Chesley Noon, MD   200 mg at 11/08/21 1645   [START ON 11/09/2021] ezetimibe (ZETIA) tablet 10 mg  10 mg Oral Daily Chesley Noon, MD       Melene Muller ON 11/09/2021] Pimavanserin Tartrate CAPS 34 mg  1 capsule Oral Daily Cheron Every E, RPH       psyllium (HYDROCIL/METAMUCIL) 1 packet  1 packet Oral QHS Chesley Noon, MD       rOPINIRole (REQUIP) tablet 2 mg  2 mg Oral QID Chesley Noon, MD   2 mg at 11/08/21 1645   sertraline (ZOLOFT) tablet 25 mg  25 mg Oral QHS Chesley Noon, MD       Current Outpatient Medications  Medication Sig Dispense Refill   acetaminophen (TYLENOL) 325 MG tablet Take 650 mg by mouth every 6 (six) hours as needed.     aspirin 81 MG EC tablet Take 1 tablet by mouth daily. 0900     carbidopa-levodopa (SINEMET IR) 25-100 MG tablet Take 2 tablets by mouth every 2 (two) hours. 0800, 1000, 1200, 1400, 1600, 1800, 2000     entacapone (COMTAN) 200 MG tablet Take 200 mg by mouth 3 (three) times daily. 0800, 1200, 1700     ezetimibe (ZETIA) 10 MG tablet Take 1 tablet (10 mg total) by mouth daily. (Patient taking differently: Take 10 mg by mouth daily. 0900) 30 tablet 11   Pimavanserin Tartrate 34 MG CAPS Take 1 capsule by mouth daily. 0900     psyllium (REGULOID) 0.52 g capsule Take 0.52 g by mouth at bedtime. 2000     rOPINIRole (REQUIP) 2 MG tablet Take 2 mg by mouth 4 (four) times daily.  0900, 1300, 1700,2000     sertraline (ZOLOFT) 25 MG tablet Take 25 mg by mouth at bedtime. 2000     Carbidopa-Levodopa ER (RYTARY) 61.25-245 MG CPCR Take by mouth.     citalopram (CELEXA) 10 MG tablet Take 10 mg for one week, then increase to 20 mg for one week, then 30 mg for one week, then 40 mg and continue.     QUEtiapine (SEROQUEL) 25 MG tablet Take by mouth.      Musculoskeletal: Strength & Muscle Tone:  did not observe Gait & Station:  did not observe Patient leans: N/A            Psychiatric Specialty Exam:  Presentation  General Appearance: Appropriate for Environment Eye Contact:Good Speech:Clear and Coherent Speech Volume:Normal Handedness:No data recorded  Mood and Affect  Mood:Depressed Affect:Congruent; Appropriate  Thought Process  Thought Processes:Coherent Descriptions of Associations:Intact Orientation:Full (Time, Place and Person) Thought  Content:Paranoid Ideation; Delusions History of Schizophrenia/Schizoaffective disorder:No data recorded Duration of Psychotic Symptoms:N/A Hallucinations:Hallucinations: Auditory Ideas of Reference:Delusions Suicidal Thoughts:Suicidal Thoughts: Yes, Passive SI Passive Intent and/or Plan: Without Intent Homicidal Thoughts:Homicidal Thoughts: No  Sensorium  Memory:Immediate Fair Judgment:Poor Insight:Fair  Executive Functions  Concentration:Good Attention Span:Good Recall:Fair Fund of Knowledge:Good Language:Fair  Psychomotor Activity  Psychomotor Activity:Psychomotor Activity: Normal (did not observe ambulation)  Assets  Assets:Communication Skills; Desire for Improvement; Resilience; Social Support  Sleep  Sleep:Sleep: Poor  Physical Exam: Physical Exam Vitals and nursing note reviewed.  HENT:     Head: Normocephalic.     Nose: No congestion or rhinorrhea.  Eyes:     General:        Right eye: No discharge.        Left eye: No discharge.  Cardiovascular:     Rate and Rhythm: Normal rate.  Pulmonary:     Effort: Pulmonary effort is normal.  Musculoskeletal:        General: Normal range of motion.     Cervical back: Normal range of motion.  Skin:    General: Skin is dry.  Neurological:     Mental Status: He is alert and oriented to person, place, and time.  Psychiatric:        Attention and Perception: Attention normal.        Mood and Affect: Mood is depressed.        Speech: Speech normal.        Behavior: Behavior is cooperative.        Thought Content: Thought content is paranoid and delusional. Thought content includes suicidal ideation. Thought content does not include homicidal ideation.        Cognition and Memory: Cognition normal.        Judgment: Judgment is impulsive.   Review of Systems  Psychiatric/Behavioral:  Positive for depression, hallucinations and suicidal ideas. Negative for memory loss and substance abuse. The patient has insomnia. The  patient is not nervous/anxious.   All other systems reviewed and are negative. Blood pressure (!) 179/82, pulse 67, temperature 97.6 F (36.4 C), temperature source Oral, resp. rate 18, height 5\' 9"  (1.753 m), weight 56.7 kg, SpO2 100 %. Body mass index is 18.46 kg/m.  Treatment Plan Summary: Daily contact with patient to assess and evaluate symptoms and progress in treatment, Medication management, and Plan :Admit for stabilization. Will refer out if no beds in this hospital.  Disposition: Recommend psychiatric Inpatient admission when medically cleared. Supportive therapy provided about ongoing stressors.  , NP 11/08/2021 6:25 PM

## 2021-11-08 NOTE — ED Notes (Signed)
Belongings placed in [place] locker room

## 2021-11-08 NOTE — BH Assessment (Signed)
PATIENT BED AVAILABLE AFTER 7AM for 11/09/21  Patient has been accepted to [facility].  Patient assigned to Geriatric Acute Unit Accepting physician is Dr. Kevan Ny.  Call report to [phone].  Representative was [employer].   ER Staff is aware of it:  [facility] ER Secretary  Dr. Larinda Buttery, ER MD  Zollie Scale Patient's Nurse     Patient is not currently willing to sign himself in voluntarily, if patient refuses and IVC is not warranted please contact facility to cancel bed

## 2021-11-08 NOTE — ED Notes (Signed)
Hospital meal provided.  100% consumed, pt tolerated w/o complaints.  Waste discarded appropriately.   

## 2021-11-08 NOTE — ED Triage Notes (Signed)
First Nurse Note:  Arrives from [facility] with [employer] under IVC paperwork.  Per officer, staff at group home stated that patient tried to jump off of balcony.  Patient calm in waiting room.  Jerking movements seen, patient rocking/ swaying back and forth.  Able to ambulate with walker.  NAD

## 2021-11-08 NOTE — BH Assessment (Signed)
Comprehensive Clinical Assessment (CCA) Note  11/08/2021 Corey Morrison [id number]  Corey Morrison, 65 year old male who presents to [facility] ED voluntarily for treatment. Per triage note, Pt comes into the ED via [facility] voluntarily, officer states she was called out to [facility] due to staff reporting pt attempting to jump off the balcony, pt has severe [name]'s, states he has been hearing voices, pt POA is present on arrival.    During TTS assessment pt presents alert and oriented x 4, anxious but cooperative, and mood-congruent with affect. The pt does not appear to be responding to internal or external stimuli. Neither is the pt presenting with any delusional thinking. Pt verified the information provided to triage RN.   Pt identifies his main complaint to be that he has been hearing voices and seeing people and things. Patient reports the voices are telling him that he is a bad person. Patient reports he is paranoid and afraid of what could happen. Pt denies using any illicit substances and drinks occasionally. Patient reports having a good appetite; however, he has some trouble sleeping and wakes up often during the night due to the voices keeping him up. Pt reports no INPT hx although he was seeing a therapist several years ago for anxiety and mild depression. Patient reports no local family support yet he has a caregiver, whom he has known since college. Pt reports a medical hx of [name]'s disease. Pt reports having suicidal ideations earlier today but at this moment he does not. Pt provided [name] as a collateral contact.    Per Sallye Ober, NP, pt is recommended for inpatient psychiatric admission.    Chief Complaint:  Chief Complaint  Patient presents with   Psychiatric Evaluation   Visit Diagnosis: Verbal auditory hallucinations    CCA Screening, Triage and Referral (STR)  Patient Reported Information How did you hear about [place]? -- Mudlogger)  Referral  name: No data recorded Referral phone number: No data recorded  Whom do you see for routine medical problems? No data recorded Practice/Facility Name: No data recorded Practice/Facility Phone Number: No data recorded Name of Contact: No data recorded Contact Number: No data recorded Contact Fax Number: No data recorded Prescriber Name: No data recorded Prescriber Address (if known): No data recorded  What Is the Reason for Your Visit/Call Today? Patient reports having auditory and visual hallucinations and expressing SI.  How Long Has This Been Causing You Problems? <Week  What Do You Feel Would Help You the Most Today? Medication(s); Treatment for Depression or other mood problem   Have You Recently Been in Any Inpatient Treatment (Hospital/Detox/Crisis Center/28-Day Program)? No data recorded Name/Location of Program/Hospital:No data recorded How Long Were You There? No data recorded When Were You Discharged? No data recorded  Have You Ever Received Services From [facility] Before? No data recorded Who Do You See at [facility]? No data recorded  Have You Recently Had Any Thoughts About Hurting Yourself? Yes  Are You Planning to Commit Suicide/Harm Yourself At This time? No   Have you Recently Had Thoughts About Hurting Someone Karolee Ohs? No  Explanation: No data recorded  Have You Used Any Alcohol or Drugs in the Past 24 Hours? No  How Long Ago Did You Use Drugs or Alcohol? No data recorded What Did You Use and How Much? No data recorded  Do You Currently Have a Therapist/Psychiatrist? No  Name of Therapist/Psychiatrist: No data recorded  Have You Been Recently Discharged From Any Office  Practice or Programs? No  Explanation of Discharge From Practice/Program: No data recorded    CCA Screening Triage Referral Assessment Type of Contact: Face-to-Face  Is this Initial or Reassessment? No data recorded Date Telepsych consult ordered in [facility]:  No data recorded Time  Telepsych consult ordered in [facility]:  No data recorded  Patient Reported Information Reviewed? No data recorded Patient Left Without Being Seen? No data recorded Reason for Not Completing Assessment: No data recorded  Collateral Involvement: [name], caregiver and close friend   Does Patient Have a Court Appointed Legal Guardian? No data recorded Name and Contact of Legal Guardian: No data recorded If Minor and Not Living with Parent(s), Who has Custody? n/a  Is CPS involved or ever been involved? Never  Is APS involved or ever been involved? Never   Patient Determined To Be At Risk for Harm To Self or Others Based on Review of Patient Reported Information or Presenting Complaint? No  Method: No data recorded Availability of Means: No data recorded Intent: No data recorded Notification Required: No data recorded Additional Information for Danger to Others Potential: No data recorded Additional Comments for Danger to Others Potential: No data recorded Are There Guns or Other Weapons in Your Home? No data recorded Types of Guns/Weapons: No data recorded Are These Weapons Safely Secured?                            No data recorded Who Could Verify You Are Able To Have These Secured: No data recorded Do You Have any Outstanding Charges, Pending Court Dates, Parole/Probation? No data recorded Contacted To Inform of Risk of Harm To Self or Others: No data recorded  [facility]   Does Patient Present under Involuntary Commitment? No  IVC Papers Initial File Date: No data recorded  [place]   Patient Currently Receiving the Following Services: Medication Management; Skilled Nursing Facility   Determination of Need: Urgent (48 hours)   Options For Referral: Inpatient Hospitalization; Medication Management     CCA Biopsychosocial Intake/Chief Complaint:  No data recorded Current Symptoms/Problems: No data recorded  Patient  Reported Schizophrenia/Schizoaffective Diagnosis in Past: No   Strengths: Patient is able to communicate and verbalize his needs.  Preferences: No data recorded Abilities: No data recorded  Type of Services Patient Feels are Needed: No data recorded  Initial Clinical Notes/Concerns: No data recorded  Mental Health Symptoms Depression:   Difficulty Concentrating   Duration of Depressive symptoms:  Less than two weeks   Mania:   None   Anxiety:    Difficulty concentrating; Restlessness   Psychosis:   Hallucinations   Duration of Psychotic symptoms:  Less than six months   Trauma:   N/A   Obsessions:   N/A   Compulsions:   N/A   Inattention:   N/A   Hyperactivity/Impulsivity:   None   Oppositional/Defiant Behaviors:   None   Emotional Irregularity:   Potentially harmful impulsivity   Other Mood/Personality Symptoms:  No data recorded   Mental Status Exam Appearance and self-care  Stature:   Average   Weight:   Average weight   Clothing:   Casual   Grooming:   Normal   Cosmetic use:   None   Posture/gait:   Normal   Motor activity:   Not Remarkable   Sensorium  Attention:   Normal   Concentration:   Anxiety interferes   Orientation:  X5   Recall/memory:   Normal   Affect and Mood  Affect:   Anxious   Mood:   Anxious   Relating  Eye contact:   Normal   Facial expression:   Responsive   Attitude toward examiner:   Cooperative   Thought and Language  Speech flow:  Clear and Coherent   Thought content:   Appropriate to Mood and Circumstances   Preoccupation:  No data recorded  Hallucinations:   Auditory; Visual   Organization:  No data recorded  [employer] of Knowledge:   Average   Intelligence:   Average   Abstraction:  No data recorded  Judgement:   Impaired   Reality Testing:   Distorted   Insight:   Good   Decision Making:   Impulsive   Social Functioning  Social  Maturity:   Impulsive   Social Judgement:   Normal   Stress  Stressors:   Illness; Relationship   Coping Ability:   Programme researcher, broadcasting/film/video Deficits:   Decision making; Self-control   Supports:   Family; Friends/Service system     Religion:    Leisure/Recreation:    Exercise/Diet: Exercise/Diet Do You Have Any Trouble Sleeping?: Yes Explanation of Sleeping Difficulties: Patient reports waking up during the night.   CCA Employment/Education Employment/Work Situation: Employment / Work Technical sales engineer: On disability  Education:     CCA Family/Childhood History Family and Relationship History:    Childhood History:     Child/Adolescent Assessment:     CCA Substance Use Alcohol/Drug Use: Alcohol / Drug Use Pain Medications: See PTA Prescriptions: See PTA Over the Counter: See PTA History of alcohol / drug use?: No history of alcohol / drug abuse                         ASAM's:  Six Dimensions of Multidimensional Assessment  Dimension 1:  Acute Intoxication and/or Withdrawal Potential:      Dimension 2:  Biomedical Conditions and Complications:      Dimension 3:  Emotional, Behavioral, or Cognitive Conditions and Complications:     Dimension 4:  Readiness to Change:     Dimension 5:  Relapse, Continued use, or Continued Problem Potential:     Dimension 6:  Recovery/Living Environment:     ASAM Severity Score:    ASAM Recommended Level of Treatment:     Substance use Disorder (SUD)    Recommendations for Services/Supports/Treatments:    DSM5 Diagnoses: Patient Active Problem List   Diagnosis Date Noted   Verbal auditory hallucinations 11/08/2021    Patient Centered Plan: Patient is on the following Treatment Plan(s):     Referrals to Alternative Service(s): Referred to Alternative Service(s):   Place:   Date:   Time:    Referred to Alternative Service(s):   Place:   Date:   Time:    Referred to Alternative  Service(s):   Place:   Date:   Time:    Referred to Alternative Service(s):   Place:   Date:   Time:     Anisha Starliper Glennon Mac, Counselor, LCAS-A

## 2021-11-08 NOTE — BH Assessment (Signed)
Referral checks:    [facility] [phone]),    [address] [phone]),    [facility] [phone] -or- [phone]),    [facility] [phone]) [phone] Under review with Purvis Kilts [phone]),    [facility] [phone] or [phone])    [facility] [phone] or [phone]),    [facility] [phone]).

## 2021-11-08 NOTE — ED Provider Notes (Signed)
[facility] Emergency Department Provider Note   ____________________________________________   Event Date/Time   First MD Initiated Contact with Patient 11/08/21 1547     (approximate)  I have reviewed the triage vital signs and the nursing notes.   HISTORY  Chief Complaint Psychiatric Evaluation    HPI Corey Morrison is a 65 y.o. male with past medical history of [name] disease, CAD, and atrial fibrillation who presents to the ED for psychiatric evaluation.  Patient states that he has been dealing with frequent nightmares and anxiety lately, symptoms have gotten bad enough that he "has thought about doing something."  Staff at his nursing facility today witnessed patient start to try and climb over the balcony on the second floor of the building.  They were concerned that he was going to jump and patient admits that he was having thoughts of harming himself.  Patient states "I do not think I would go through with it."  He denies any medical complaints at this time, does state it has been multiple hours since he has gotten his usual dose of [name] medication.        Past Medical History:  Diagnosis Date   A-fib [facility])    Coronary artery disease    [name] disease (HCC)     There are no problems to display for this patient.   Past Surgical History:  Procedure Laterality Date   CARDIAC CATHETERIZATION      Prior to Admission medications   Medication Sig Start Date End Date Taking? Authorizing Provider  acetaminophen (TYLENOL) 325 MG tablet Take 650 mg by mouth every 6 (six) hours as needed. 10/26/20  Yes [provider]  aspirin 81 MG EC tablet Take 1 tablet by mouth daily. 0900 06/06/20  Yes [provider]  carbidopa-levodopa (SINEMET IR) 25-100 MG tablet Take 2 tablets by mouth every 2 (two) hours. 0800, 1000, 1200, 1400, 1600, 1800, 2000 11/08/21  Yes [provider]  entacapone (COMTAN) 200 MG tablet Take 200 mg  by mouth 3 (three) times daily. 0800, 1200, 1700 06/10/21 11/08/21 Yes [provider]  ezetimibe (ZETIA) 10 MG tablet Take 1 tablet (10 mg total) by mouth daily. Patient taking differently: Take 10 mg by mouth daily. 0900 07/26/21 11/08/21 Yes Agbor-Etang, Arlys John, MD  Pimavanserin Tartrate 34 MG CAPS Take 1 capsule by mouth daily. 0900   Yes [provider]  psyllium (REGULOID) 0.52 g capsule Take 0.52 g by mouth at bedtime. 2000 11/08/21  Yes [provider]  rOPINIRole (REQUIP) 2 MG tablet Take 2 mg by mouth 4 (four) times daily. 0900, 1300, 1700,2000   Yes [provider]  sertraline (ZOLOFT) 25 MG tablet Take 25 mg by mouth at bedtime. 2000   Yes [provider]  Carbidopa-Levodopa ER (RYTARY) 61.25-245 MG CPCR Take by mouth.    [provider]  citalopram (CELEXA) 10 MG tablet Take 10 mg for one week, then increase to 20 mg for one week, then 30 mg for one week, then 40 mg and continue. 06/10/21   [provider]  QUEtiapine (SEROQUEL) 25 MG tablet Take by mouth. 01/20/21 09/05/21  [provider]    Allergies Patient has no known allergies.  Family History  Problem Relation Age of Onset   Heart Problems Father     Social History Social History   Tobacco Use   Smoking status: Former   Smokeless tobacco: Never  Substance Use Topics   Alcohol use: Never  Drug use: Never    Review of Systems  Constitutional: No fever/chills Eyes: No visual changes. ENT: No sore throat. Cardiovascular: Denies chest pain. Respiratory: Denies shortness of breath. Gastrointestinal: No abdominal pain.  No nausea, no vomiting.  No diarrhea.  No constipation. Genitourinary: Negative for dysuria. Musculoskeletal: Negative for back pain. Skin: Negative for rash. Neurological: Negative for headaches, focal weakness or numbness.  Positive for suicidal ideation.  ____________________________________________   PHYSICAL  EXAM:  VITAL SIGNS: ED Triage Vitals  Enc Vitals Group     BP 11/08/21 1125 (!) 91/53     Pulse Rate 11/08/21 1125 73     Resp 11/08/21 1125 17     Temp 11/08/21 1125 (!) 97.5 F (36.4 C)     Temp Source 11/08/21 1125 Oral     SpO2 11/08/21 1125 99 %     Weight 11/08/21 1130 125 lb (56.7 kg)     Height 11/08/21 1130 5\' 9"  (1.753 m)     Head Circumference --      Peak Flow --      Pain Score 11/08/21 1129 0     Pain Loc --      Pain Edu? --      Excl. in GC? --     Constitutional: Alert and oriented. Eyes: Conjunctivae are normal. Head: Atraumatic. Nose: No congestion/rhinnorhea. Mouth/Throat: Mucous membranes are moist. Neck: Normal ROM Cardiovascular: Normal rate, regular rhythm. Grossly normal heart sounds. Respiratory: Normal respiratory effort.  No retractions. Lungs CTAB. Gastrointestinal: Soft and nontender. No distention. Genitourinary: deferred Musculoskeletal: No lower extremity tenderness nor edema. Neurologic:  Normal speech and language. No gross focal neurologic deficits are appreciated.  Tremor noted. Skin:  Skin is warm, dry and intact. No rash noted. Psychiatric: Mood and affect are normal. Speech and behavior are normal.  ____________________________________________   LABS (all labs ordered are listed, but only abnormal results are displayed)  Labs Reviewed  COMPREHENSIVE METABOLIC PANEL - Abnormal; Notable for the following components:      Result Value   BUN 25 (*)    Calcium 8.6 (*)    Albumin 3.4 (*)    AST 14 (*)    All other components within normal limits  SALICYLATE LEVEL - Abnormal; Notable for the following components:   Salicylate Lvl <7.0 (*)    All other components within normal limits  ACETAMINOPHEN LEVEL - Abnormal; Notable for the following components:   Acetaminophen (Tylenol), Serum <10 (*)    All other components within normal limits  CBC - Abnormal; Notable for the following components:   RBC 3.25 (*)    Hemoglobin 9.8 (*)     HCT 31.1 (*)    All other components within normal limits  URINE DRUG SCREEN, QUALITATIVE ([facility] ONLY) - Abnormal; Notable for the following components:   Cannabinoid 50 Ng, Ur [place] POSITIVE (*)    All other components within normal limits  RESP PANEL BY RT-PCR (FLU A&B, COVID) ARPGX2  ETHANOL    PROCEDURES  Procedure(s) performed (including Critical Care):  Procedures   ____________________________________________   INITIAL IMPRESSION / ASSESSMENT AND PLAN / ED COURSE      65 year old male with past medical history of [name] disease, CAD, and atrial fibrillation who presents to the ED for psychiatric evaluation after having thoughts of suicide and attempting to climb over the balcony at his nursing facility.  Patient states he does not think he would actually go through with harming himself, but that recent nightmares and anxiety have  been pushing him towards doing so.  Given he is calm and cooperative with no active suicidal ideation, we will maintain voluntary status.  He denies any medical complaints at this time and screening labs are unremarkable.  He may be medically cleared for psychiatric disposition.  The patient has been placed in psychiatric observation due to the need to provide a safe environment for the patient while obtaining psychiatric consultation and evaluation, as well as ongoing medical and medication management to treat the patient's condition.  The patient has not been placed under full IVC at this time.       ____________________________________________   FINAL CLINICAL IMPRESSION(S) / ED DIAGNOSES  Final diagnoses:  Suicidal ideation     ED Discharge Orders     None        Note:  This document was prepared using Dragon voice recognition software and may include unintentional dictation errors.    Chesley Noon, MD 11/08/21 [phone]

## 2021-11-08 NOTE — BH Assessment (Signed)
Adult MH  Referral information for Psychiatric Hospitalization faxed to:   [facility] ([phone]-or- [phone]),   [facility] ([phone]),   [facility] ([phone] -or- [phone]),   [facility] (-[phone] -or- [phone]) [phone]   [place] ([phone]),   [place] ([phone] or [phone])   [place] ([phone] or [phone]),   [place] ([phone]). 

## 2021-11-08 NOTE — ED Notes (Signed)
VOL  pending  consult 

## 2021-11-08 NOTE — ED Triage Notes (Signed)
Pt comes into the ED via [facility] voluntarily, officer states she was called out to [facility] due to staff reporting pt attempting to jump off the balcony, pt has severe parkinson's, states he has been hearing voices, pt POA is present on arrival

## 2021-11-09 NOTE — ED Notes (Signed)
C COM called for transport to [facility]

## 2021-11-09 NOTE — BH Assessment (Signed)
Writer received call from [place] at [facility] stating patient needs to be IVC'd before being transported per facility physician.

## 2021-11-09 NOTE — ED Notes (Signed)
Spoke with [place] with [facility] on possible pt placement.

## 2021-11-09 NOTE — ED Notes (Signed)
Pt ambulatory to bathroom at this time.

## 2022-05-28 ENCOUNTER — Emergency Department: Payer: Medicare Other

## 2022-05-28 ENCOUNTER — Emergency Department
Admission: EM | Admit: 2022-05-28 | Discharge: 2022-05-28 | Disposition: A | Discharge status: Skilled Nursing Facility | Payer: Medicare Other | Attending: Emergency Medicine | Admitting: Emergency Medicine

## 2022-05-28 ENCOUNTER — Encounter: Payer: Self-pay | Admitting: Emergency Medicine

## 2022-05-28 ENCOUNTER — Other Ambulatory Visit: Payer: Self-pay

## 2022-05-28 DIAGNOSIS — Z7982 Long term (current) use of aspirin: Secondary | ICD-10-CM | POA: Diagnosis not present

## 2022-05-28 DIAGNOSIS — W19XXXA Unspecified fall, initial encounter: Secondary | ICD-10-CM

## 2022-05-28 DIAGNOSIS — M25551 Pain in right hip: Secondary | ICD-10-CM | POA: Diagnosis not present

## 2022-05-28 DIAGNOSIS — M79671 Pain in right foot: Secondary | ICD-10-CM | POA: Insufficient documentation

## 2022-05-28 DIAGNOSIS — G20 Parkinson's disease: Secondary | ICD-10-CM | POA: Diagnosis not present

## 2022-05-28 DIAGNOSIS — I2510 Atherosclerotic heart disease of native coronary artery without angina pectoris: Secondary | ICD-10-CM | POA: Diagnosis not present

## 2022-05-28 DIAGNOSIS — M25552 Pain in left hip: Secondary | ICD-10-CM | POA: Diagnosis not present

## 2022-05-28 DIAGNOSIS — M79672 Pain in left foot: Secondary | ICD-10-CM | POA: Diagnosis not present

## 2022-05-28 DIAGNOSIS — W06XXXA Fall from bed, initial encounter: Secondary | ICD-10-CM | POA: Diagnosis not present

## 2022-05-28 LAB — URINALYSIS, ROUTINE W REFLEX MICROSCOPIC
Bilirubin Urine: NEGATIVE
Glucose, UA: NEGATIVE mg/dL
Hgb urine dipstick: NEGATIVE
Ketones, ur: 5 mg/dL — AB
Leukocytes,Ua: NEGATIVE
Nitrite: NEGATIVE
Protein, ur: NEGATIVE mg/dL
Specific Gravity, Urine: 1.024 (ref 1.005–1.030)
pH: 5 (ref 5.0–8.0)

## 2022-05-28 LAB — CBC WITH DIFFERENTIAL/PLATELET
Abs Immature Granulocytes: 0.03 10*3/uL (ref 0.00–0.07)
Basophils Absolute: 0 10*3/uL (ref 0.0–0.1)
Basophils Relative: 0 %
Eosinophils Absolute: 0.1 10*3/uL (ref 0.0–0.5)
Eosinophils Relative: 1 %
HCT: 35.4 % — ABNORMAL LOW (ref 39.0–52.0)
Hemoglobin: 11.1 g/dL — ABNORMAL LOW (ref 13.0–17.0)
Immature Granulocytes: 0 %
Lymphocytes Relative: 12 %
Lymphs Abs: 0.8 10*3/uL (ref 0.7–4.0)
MCH: 28.8 pg (ref 26.0–34.0)
MCHC: 31.4 g/dL (ref 30.0–36.0)
MCV: 91.7 fL (ref 80.0–100.0)
Monocytes Absolute: 0.5 10*3/uL (ref 0.1–1.0)
Monocytes Relative: 7 %
Neutro Abs: 5.8 10*3/uL (ref 1.7–7.7)
Neutrophils Relative %: 80 %
Platelets: 176 10*3/uL (ref 150–400)
RBC: 3.86 MIL/uL — ABNORMAL LOW (ref 4.22–5.81)
RDW: 14.2 % (ref 11.5–15.5)
WBC: 7.3 10*3/uL (ref 4.0–10.5)
nRBC: 0 % (ref 0.0–0.2)

## 2022-05-28 LAB — BASIC METABOLIC PANEL
Anion gap: 6 (ref 5–15)
BUN: 28 mg/dL — ABNORMAL HIGH (ref 8–23)
CO2: 26 mmol/L (ref 22–32)
Calcium: 9 mg/dL (ref 8.9–10.3)
Chloride: 110 mmol/L (ref 98–111)
Creatinine, Ser: 0.99 mg/dL (ref 0.61–1.24)
GFR, Estimated: >60 mL/min (ref >60)
Glucose, Bld: 95 mg/dL (ref 70–99)
Potassium: 4 mmol/L (ref 3.5–5.1)
Sodium: 142 mmol/L (ref 135–145)

## 2022-05-28 MED ORDER — ONDANSETRON HCL 4 MG/2ML IJ SOLN
4.0000 mg | Freq: Once | INTRAMUSCULAR | Status: AC
Start: 2022-05-28 — End: 2022-05-28
  Administered 2022-05-28: 4 mg via INTRAVENOUS
  Filled 2022-05-28: qty 2

## 2022-05-28 MED ORDER — CARBIDOPA-LEVODOPA 25-100 MG PO TABS
1.0000 | ORAL_TABLET | Freq: Once | ORAL | Status: DC
  Filled 2022-05-28: qty 1

## 2022-05-28 MED ORDER — FENTANYL CITRATE PF 50 MCG/ML IJ SOSY
50.0000 ug | PREFILLED_SYRINGE | Freq: Once | INTRAMUSCULAR | Status: AC
  Administered 2022-05-28: 50 ug via INTRAVENOUS
  Filled 2022-05-28: qty 1

## 2022-05-28 NOTE — ED Provider Notes (Signed)
[facility] Provider Note    Event Date/Time   First MD Initiated Contact with Patient 05/28/22 [phone]     (approximate)   History   Fall   HPI  Waylen Depaolo is a 66 y.o. male with history of atrial fibrillation not on anticoagulation, coronary artery disease, [name]'s disease who presents to the emergency department from his nursing facility after he fell out of bed.  States he just rolled out of bed.  Does not think he hit his head.  Complaining of bilateral hip and bilateral foot pain.  Ambulates with a walker at baseline.  No neck or back pain.  No chest or abdominal pain.  No preceding symptoms that led to his fall.   History provided by patient and EMS.    Past Medical History:  Diagnosis Date   A-fib (HCC)    Anxiety disorder    Coronary artery disease    Hyperlipidemia    [name] disease (HCC)    Vitamin B12 deficiency     Past Surgical History:  Procedure Laterality Date   CARDIAC CATHETERIZATION      MEDICATIONS:  Prior to Admission medications   Medication Sig Start Date End Date Taking? Authorizing Provider  acetaminophen (TYLENOL) 325 MG tablet Take 650 mg by mouth every 6 (six) hours as needed. 10/26/20   [provider]  aspirin 81 MG EC tablet Take 1 tablet by mouth daily. 0900 06/06/20   [provider]  carbidopa-levodopa (SINEMET IR) 25-100 MG tablet Take 2 tablets by mouth every 2 (two) hours. 0800, 1000, 1200, 1400, 1600, 1800, 2000 11/08/21   [provider]  Carbidopa-Levodopa ER (RYTARY) 61.25-245 MG CPCR Take by mouth.    [provider]  citalopram (CELEXA) 10 MG tablet Take 10 mg for one week, then increase to 20 mg for one week, then 30 mg for one week, then 40 mg and continue. 06/10/21   [provider]  entacapone (COMTAN) 200 MG tablet Take 200 mg by mouth 3 (three) times daily. 0800, 1200, 1700 06/10/21 11/08/21  [provider]  ezetimibe (ZETIA) 10 MG tablet  Take 1 tablet (10 mg total) by mouth daily. Patient taking differently: Take 10 mg by mouth daily. 0900 07/26/21 11/08/21  Debbe Odea, MD  Pimavanserin Tartrate 34 MG CAPS Take 1 capsule by mouth daily. 0900    [provider]  psyllium (REGULOID) 0.52 g capsule Take 0.52 g by mouth at bedtime. 2000 11/08/21   [provider]  QUEtiapine (SEROQUEL) 25 MG tablet Take by mouth. 01/20/21 09/05/21  [provider]  rOPINIRole (REQUIP) 2 MG tablet Take 2 mg by mouth 4 (four) times daily. 0900, 1300, 1700,2000    [provider]  sertraline (ZOLOFT) 25 MG tablet Take 25 mg by mouth at bedtime. 2000    [provider]    Physical Exam   Triage Vital Signs: ED Triage Vitals  Enc Vitals Group     BP 05/28/22 0455 (!) 159/87     Pulse Rate 05/28/22 0455 74     Resp 05/28/22 0455 16     Temp 05/28/22 0455 98.6 F (37 C)     Temp Source 05/28/22 0455 Oral     SpO2 05/28/22 0455 100 %     Weight 05/28/22 0519 155 lb (70.3 kg)     Height 05/28/22 0519 5\' 9"  (1.753 m)     Head Circumference --      Peak Flow --  Pain Score 05/28/22 0519 8     Pain Loc --      Pain Edu? --      Excl. in GC? --     Most recent vital signs: Vitals:   05/28/22 0710 05/28/22 0800  BP: (!) 151/89 (!) 144/87  Pulse: 71 71  Resp: 20 20  Temp:    SpO2: 98% 98%     CONSTITUTIONAL: Alert and oriented x 3 and responds appropriately to questions. Well-appearing; well-nourished; GCS 15 HEAD: Normocephalic; atraumatic EYES: Conjunctivae clear, PERRL, EOMI ENT: normal nose; no rhinorrhea; moist mucous membranes; pharynx without lesions noted; no dental injury; no septal hematoma, no epistaxis; no facial deformity or bony tenderness NECK: Supple, no midline spinal tenderness, step-off or deformity; trachea midline CARD: RRR; S1 and S2 appreciated; no murmurs, no clicks, no rubs, no gallops RESP: Normal chest excursion without splinting or tachypnea; breath sounds  clear and equal bilaterally; no wheezes, no rhonchi, no rales; no hypoxia or respiratory distress CHEST:  chest wall stable, no crepitus or ecchymosis or deformity, nontender to palpation; no flail chest ABD/GI: Normal bowel sounds; non-distended; soft, non-tender, no rebound, no guarding; no ecchymosis or other lesions noted PELVIS:  stable, nontender to palpation, no leg length discrepancy BACK:  The back appears normal; no midline spinal tenderness, step-off or deformity EXT: Normal ROM in all joints; non-tender to palpation; no edema; normal capillary refill; no cyanosis, no bony tenderness or bony deformity of patient's extremities, no joint effusion, compartments are soft, extremities are warm and well-perfused, no ecchymosis, 2+ DP pulses bilaterally SKIN: Normal color for age and race; warm NEURO: No facial asymmetry, normal speech, moving all extremities equally, ambulates with a walker on his own with slow shuffling gait  ED Results / Procedures / Treatments   LABS: (all labs ordered are listed, but only abnormal results are displayed) Labs Reviewed  CBC WITH DIFFERENTIAL/PLATELET - Abnormal; Notable for the following components:      Result Value   RBC 3.86 (*)    Hemoglobin 11.1 (*)    HCT 35.4 (*)    All other components within normal limits  BASIC METABOLIC PANEL - Abnormal; Notable for the following components:   BUN 28 (*)    All other components within normal limits  URINALYSIS, ROUTINE W REFLEX MICROSCOPIC - Abnormal; Notable for the following components:   Color, Urine YELLOW (*)    APPearance CLEAR (*)    Ketones, ur 5 (*)    All other components within normal limits  SAMPLE TO BLOOD BANK     EKG:  EKG Interpretation  Date/Time:  Saturday May 28 2022 04:56:48 EDT Ventricular Rate:  69 PR Interval:  159 QRS Duration: 148 QT Interval:  425 QTC Calculation: 456 R Axis:   -57 Text Interpretation: Sinus rhythm Left bundle branch block Confirmed by Hulen Mandler,  Baxter Hire [phone]) on 05/28/2022 6:37:25 AM          RADIOLOGY: My personal review and interpretation of imaging: CT head and cervical spine show no traumatic injury.  X-rays of both hips and feet show no fracture or dislocation.  I have personally reviewed all radiology reports. CT HEAD WO CONTRAST ( )  Result Date: 05/28/2022 CLINICAL DATA:  Larey Seat out of bed today with polytrauma. EXAM: CT HEAD WITHOUT CONTRAST CT CERVICAL SPINE WITHOUT CONTRAST TECHNIQUE: Multidetector CT imaging of the head and cervical spine was performed following the standard protocol without intravenous contrast. Multiplanar CT image reconstructions of the cervical spine were also generated. RADIATION  DOSE REDUCTION: This exam was performed according to the departmental dose-optimization program which includes automated exposure control, adjustment of the mA and/or kV according to patient size and/or use of iterative reconstruction technique. COMPARISON:  Head CT 07/15/2020. No prior cervical spine CT or films. FINDINGS: CT HEAD FINDINGS Brain: There is mild cerebral atrophy and small-vessel disease, slight atrophic ventriculomegaly without midline shift. The cerebellum and brainstem are unremarkable. There is no focal asymmetry concerning for an acute infarct, hemorrhage or mass. The basal cisterns are clear. Vascular: There are scattered calcifications in the siphons but no hyperdense central vasculature. Skull: Negative for fracture or focal lesion. Sinuses/Orbits: There is mild disease in the ethmoid air cells, right sphenoid sinus. Other visible sinuses and bilateral mastoid air cells are clear. Other: None. CT CERVICAL SPINE FINDINGS Alignment: There is a trace retrolisthesis at C5-6 where the vertebral bodies are ankylosed across the chronically collapsed disc space. There is a straightened slightly reversed cervical lordosis without further listhesis. There is a mild cervical dextroscoliosis. Narrowing and spurring is seen  of the anterior atlantodental joint with small loose bodies between the clivus and dens and calcified pannus in the posterior joint space. Skull base and vertebrae: There is osteopenia without evidence of fractures or focal lesions. Soft tissues and spinal canal: No prevertebral fluid or swelling. No visible canal hematoma. There is mild calcification in the left proximal cervical ICA. No laryngeal or thyroid mass. Disc levels: There is a mild wedge deformity of the C5 vertebral body which is ankylosed to C6 with no residual disc space between them. This may have been due to an old trauma. There is a posterior osteophytic ridging eccentric to the right at this level which causes moderate to severe stenosis of the right hemicanal and compression of the right hemicord. There is mild disc space loss and small bidirectional osteophytes at C4-5, moderate disc space loss and small to moderate bidirectional osteophytosis at C6-7, and additional moderate to severe spondylotic spinal canal stenosis at C6-7 with disc osteophyte complex compressing the cord eccentric to the left. Other levels show normal disc heights and no other significant soft tissue or bony encroachment on the thecal sac. Spurring of facet and uncinate joints is seen with foraminal stenosis which is moderate to severe on the left at C3-4, severe on the left C4-5, bilaterally mild-to-moderate C5-6 and bilaterally moderate to severe at C6-7. Upper chest:  negative. Other: None. IMPRESSION: 1. No acute intracranial CT findings, depressed skull fractures or interval changes. Chronic change. 2. Osteopenia and degenerative change of the cervical spine without evidence of fractures. 3. 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 Electronically Signed   By: Almira Bar M.D.   On: 05/28/2022 06:35   CT Cervical Spine Wo Contrast  Result Date: 05/28/2022 CLINICAL DATA:  Larey Seat  out of bed today with polytrauma. EXAM: CT HEAD WITHOUT CONTRAST CT CERVICAL SPINE WITHOUT CONTRAST TECHNIQUE: Multidetector CT imaging of the head and cervical spine was performed following the standard protocol without intravenous contrast. Multiplanar CT image reconstructions of the cervical spine were also generated. RADIATION DOSE REDUCTION: This exam was performed according to the departmental dose-optimization program which includes automated exposure control, adjustment of the mA and/or kV according to patient size and/or use of iterative reconstruction technique. COMPARISON:  Head CT 07/15/2020. No prior cervical spine CT or films. FINDINGS: CT HEAD FINDINGS Brain: There is mild cerebral atrophy and small-vessel disease,  slight atrophic ventriculomegaly without midline shift. The cerebellum and brainstem are unremarkable. There is no focal asymmetry concerning for an acute infarct, hemorrhage or mass. The basal cisterns are clear. Vascular: There are scattered calcifications in the siphons but no hyperdense central vasculature. Skull: Negative for fracture or focal lesion. Sinuses/Orbits: There is mild disease in the ethmoid air cells, right sphenoid sinus. Other visible sinuses and bilateral mastoid air cells are clear. Other: None. CT CERVICAL SPINE FINDINGS Alignment: There is a trace retrolisthesis at C5-6 where the vertebral bodies are ankylosed across the chronically collapsed disc space. There is a straightened slightly reversed cervical lordosis without further listhesis. There is a mild cervical dextroscoliosis. Narrowing and spurring is seen of the anterior atlantodental joint with small loose bodies between the clivus and dens and calcified pannus in the posterior joint space. Skull base and vertebrae: There is osteopenia without evidence of fractures or focal lesions. Soft tissues and spinal canal: No prevertebral fluid or swelling. No visible canal hematoma. There is mild calcification in the  left proximal cervical ICA. No laryngeal or thyroid mass. Disc levels: There is a mild wedge deformity of the C5 vertebral body which is ankylosed to C6 with no residual disc space between them. This may have been due to an old trauma. There is a posterior osteophytic ridging eccentric to the right at this level which causes moderate to severe stenosis of the right hemicanal and compression of the right hemicord. There is mild disc space loss and small bidirectional osteophytes at C4-5, moderate disc space loss and small to moderate bidirectional osteophytosis at C6-7, and additional moderate to severe spondylotic spinal canal stenosis at C6-7 with disc osteophyte complex compressing the cord eccentric to the left. Other levels show normal disc heights and no other significant soft tissue or bony encroachment on the thecal sac. Spurring of facet and uncinate joints is seen with foraminal stenosis which is moderate to severe on the left at C3-4, severe on the left C4-5, bilaterally mild-to-moderate C5-6 and bilaterally moderate to severe at C6-7. Upper chest:  negative. Other: None. IMPRESSION: 1. No acute intracranial CT findings, depressed skull fractures or interval changes. Chronic change. 2. Osteopenia and degenerative change of the cervical spine without evidence of fractures. 3. 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 Electronically Signed   By: Almira BarKeith  Chesser M.D.   On: 05/28/2022 06:35   DG Hip Unilat W or Wo Pelvis 2-3 Views Left  Result Date: 05/28/2022 CLINICAL DATA:  66 year old male status post fall from bed. Pain. EXAM: DG HIP (WITH OR WITHOUT PELVIS) 2-3V LEFT COMPARISON:  Right hip series today reported separately. FINDINGS: AP pelvis. Femoral heads are normally located. Hip joint spaces appear symmetric. No pelvis fracture identified. Pelvic phleboliths. Negative visible bowel gas. Right femur is detailed  separately. Intact proximal left femur. IMPRESSION: No acute fracture or dislocation identified about the left hip, pelvis. Right hip is detailed separately. Electronically Signed   By: Odessa FlemingH  Hall M.D.   On: 05/28/2022 06:32   DG Foot Complete Left  Result Date: 05/28/2022 CLINICAL DATA:  66 year old male status post fall from bed.  Pain. EXAM: LEFT FOOT - COMPLETE 3+ VIEW COMPARISON:  None Available. FINDINGS: Bone mineralization is within normal limits for age. There is no evidence of fracture or dislocation. There is no evidence of arthropathy or other focal bone abnormality. No discrete soft tissue injury. IMPRESSION: Negative. Electronically Signed  By: Odessa Fleming M.D.   On: 05/28/2022 06:30   DG Foot Complete Right  Result Date: 05/28/2022 CLINICAL DATA:  66 year old male with history of trauma from a fall complaining of right-sided foot pain. EXAM: RIGHT FOOT COMPLETE - 3+ VIEW COMPARISON:  No priors. FINDINGS: There is no evidence of fracture or dislocation. Small bony fragment at small well corticated bony fragment adjacent to the medial aspect of the distal first proximal phalanx, likely related to remote avulsion fracture. There is no evidence of arthropathy or other focal bone abnormality. Soft tissues are unremarkable. IMPRESSION: 1. No acute radiographic abnormality of the right foot. Electronically Signed   By: Trudie Reed M.D.   On: 05/28/2022 06:30   DG Hip Unilat W or Wo Pelvis 2-3 Views Right  Result Date: 05/28/2022 CLINICAL DATA:  66 year old male with history of trauma from a fall complaining of right-sided hip pain. EXAM: DG HIP (WITH OR WITHOUT PELVIS) 2-3V RIGHT COMPARISON:  No priors. FINDINGS: Two views of the right hip demonstrate no acute displaced fracture, subluxation or dislocation. There is joint space narrowing, subchondral sclerosis, subchondral cyst formation and osteophyte formation, indicative of moderate osteoarthritis. IMPRESSION: 1. No acute radiographic abnormality  of the right hip. 2. Moderate right hip joint osteoarthritis. Electronically Signed   By: Trudie Reed M.D.   On: 05/28/2022 06:28     PROCEDURES:  Critical Care performed: No      .1-3 Lead EKG Interpretation  Performed by: Bradrick Kamau, Layla Maw, DO Authorized by: Reinhold Rickey, Layla Maw, DO     Interpretation: normal     ECG rate:  71   ECG rate assessment: normal     Rhythm: sinus rhythm     Ectopy: none     Conduction: normal       IMPRESSION / MDM / ASSESSMENT AND PLAN / ED COURSE  I reviewed the triage vital signs and the nursing notes.  Patient here after a fall out of bed from his nursing facility complaining of bilateral hip and foot pain.  The patient is on the cardiac monitor to evaluate for evidence of arrhythmia and/or significant heart rate changes.   DIFFERENTIAL DIAGNOSIS (includes but not limited to):   Fracture, contusion, intracranial hemorrhage, skull fracture, concussion, anemia, electrolyte derangement, arrhythmia, ACS, UTI  Patient's presentation is most consistent with acute presentation with potential threat to life or bodily function.  PLAN: We will obtain CBC, BMP, EKG, urinalysis, CT of the head and cervical spine, x-rays of both feet and hips.  We will give pain medication here.   MEDICATIONS GIVEN IN ED: Medications  carbidopa-levodopa (SINEMET IR) 25-100 MG per tablet immediate release 1 tablet (1 tablet Oral Not Given 05/28/22 0810)  fentaNYL (SUBLIMAZE) injection 50 mcg (50 mcg Intravenous Given 05/28/22 0716)  ondansetron (ZOFRAN) injection 4 mg (4 mg Intravenous Given 05/28/22 0716)     ED COURSE: Patient's labs show mild anemia which is improved compared to previous.  Normal electrolytes and glucose.  Urine shows no sign of infection.  CT of the head and cervical spine reviewed/interpreted by myself radiology and shows no acute traumatic injury.  X-ray of bilateral hips and feet reviewed/interpreted by myself and radiologist and show no fracture or  dislocation.  He has been able to ambulate with a walker here with minimal assistance which is his baseline.  Able to tolerate p.o.  Requested dose of his carbidopa levodopa prior to discharge back to his nursing facility.  Recommended Tylenol as needed.  Patient verbalized  understanding and is comfortable with this plan.  At this time, I do not feel there is any life-threatening condition present. I reviewed all nursing notes, vitals, pertinent previous records.  All lab and urine results, EKGs, imaging ordered have been independently reviewed and interpreted by myself.  I reviewed all available radiology reports from any imaging ordered this visit.  Based on my assessment, I feel the patient is safe to be discharged home without further emergent workup and can continue workup as an outpatient as needed. Discussed all findings, treatment plan as well as usual and customary return precautions.  They verbalize understanding and are comfortable with this plan.  Outpatient follow-up has been provided as needed.  All questions have been answered.    CONSULTS: Admission considered but given work-up is reassuring, patient is at his neurologic baseline and able to ambulate and tolerate p.o., will discharge home.   OUTSIDE RECORDS REVIEWED: Reviewed patient's last office visit with Theora Master with neurology on 03/28/2022.       FINAL CLINICAL IMPRESSION(S) / ED DIAGNOSES   Final diagnoses:  Fall, initial encounter     Rx / DC Orders   ED Discharge Orders     None        Note:  This document was prepared using Dragon voice recognition software and may include unintentional dictation errors.   Bernardina Cacho, Layla Maw, DO 05/28/22 [phone]

## 2022-05-28 NOTE — ED Notes (Signed)
Pt assisted to toilet with the use of a walker. Pt ambulated well. Urine sent to lab.

## 2022-05-28 NOTE — Discharge Instructions (Signed)
You are seen in the emergency department after a fall out of bed.  CT of your head, cervical spine, x-rays of both hips and both feet showed no acute abnormality.  Lab work, urine, EKG also showed no abnormality.  He has been able to ambulate, eat and drink here.  We will discharge you home to your nursing facility.  You may take Tylenol as needed for pain.

## 2022-05-28 NOTE — ED Triage Notes (Signed)
Pt to ED via [facility] from [facility]. Per EMS pt rolled out of bed this AM, c/o bilateral leg pain and pelvic pain, shortening noted to R leg, no rotation. Per EMS pt also hallucinating, states pt fell out of bed after throwing water bottles at people he thought were in the room but were not in the room.

## 2022-05-28 NOTE — ED Notes (Signed)
Pt tried to get out of bed. Pt moved back onto bed and bed alarm turned on.

## 2022-06-11 ENCOUNTER — Emergency Department
Admission: EM | Admit: 2022-06-11 | Discharge: 2022-06-11 | Disposition: A | Discharge status: Skilled Nursing Facility | Payer: Medicare Other | Attending: Emergency Medicine | Admitting: Emergency Medicine

## 2022-06-11 ENCOUNTER — Other Ambulatory Visit: Payer: Self-pay

## 2022-06-11 DIAGNOSIS — F32A Depression, unspecified: Secondary | ICD-10-CM | POA: Diagnosis not present

## 2022-06-11 DIAGNOSIS — F29 Unspecified psychosis not due to a substance or known physiological condition: Secondary | ICD-10-CM | POA: Diagnosis not present

## 2022-06-11 DIAGNOSIS — F259 Schizoaffective disorder, unspecified: Secondary | ICD-10-CM | POA: Diagnosis not present

## 2022-06-11 DIAGNOSIS — G20 Parkinson's disease: Secondary | ICD-10-CM | POA: Diagnosis not present

## 2022-06-11 DIAGNOSIS — Z0489 Encounter for examination and observation for other specified reasons: Secondary | ICD-10-CM | POA: Diagnosis present

## 2022-06-11 DIAGNOSIS — Z79899 Other long term (current) drug therapy: Secondary | ICD-10-CM | POA: Insufficient documentation

## 2022-06-11 DIAGNOSIS — I2510 Atherosclerotic heart disease of native coronary artery without angina pectoris: Secondary | ICD-10-CM | POA: Diagnosis not present

## 2022-06-11 DIAGNOSIS — I4891 Unspecified atrial fibrillation: Secondary | ICD-10-CM | POA: Diagnosis not present

## 2022-06-11 DIAGNOSIS — R443 Hallucinations, unspecified: Secondary | ICD-10-CM

## 2022-06-11 DIAGNOSIS — Z20822 Contact with and (suspected) exposure to covid-19: Secondary | ICD-10-CM | POA: Diagnosis not present

## 2022-06-11 LAB — URINE DRUG SCREEN, QUALITATIVE (ARMC ONLY)
Amphetamines, Ur Screen: NOT DETECTED
Barbiturates, Ur Screen: NOT DETECTED
Benzodiazepine, Ur Scrn: NOT DETECTED
Cannabinoid 50 Ng, Ur ~~LOC~~: NOT DETECTED
Cocaine Metabolite,Ur ~~LOC~~: NOT DETECTED
MDMA (Ecstasy)Ur Screen: NOT DETECTED
Methadone Scn, Ur: NOT DETECTED
Opiate, Ur Screen: NOT DETECTED
Phencyclidine (PCP) Ur S: NOT DETECTED
Tricyclic, Ur Screen: NOT DETECTED

## 2022-06-11 LAB — COMPREHENSIVE METABOLIC PANEL
ALT: 12 U/L (ref 0–44)
AST: 11 U/L — ABNORMAL LOW (ref 15–41)
Albumin: 3.5 g/dL (ref 3.5–5.0)
Alkaline Phosphatase: 57 U/L (ref 38–126)
Anion gap: 3 — ABNORMAL LOW (ref 5–15)
BUN: 25 mg/dL — ABNORMAL HIGH (ref 8–23)
CO2: 28 mmol/L (ref 22–32)
Calcium: 8.7 mg/dL — ABNORMAL LOW (ref 8.9–10.3)
Chloride: 109 mmol/L (ref 98–111)
Creatinine, Ser: 1.03 mg/dL (ref 0.61–1.24)
GFR, Estimated: >60 mL/min (ref >60)
Glucose, Bld: 103 mg/dL — ABNORMAL HIGH (ref 70–99)
Potassium: 4.2 mmol/L (ref 3.5–5.1)
Sodium: 140 mmol/L (ref 135–145)
Total Bilirubin: 0.6 mg/dL (ref 0.3–1.2)
Total Protein: 6.3 g/dL — ABNORMAL LOW (ref 6.5–8.1)

## 2022-06-11 LAB — CBC WITH DIFFERENTIAL/PLATELET
Abs Immature Granulocytes: 0.03 10*3/uL (ref 0.00–0.07)
Basophils Absolute: 0 10*3/uL (ref 0.0–0.1)
Basophils Relative: 0 %
Eosinophils Absolute: 0.1 10*3/uL (ref 0.0–0.5)
Eosinophils Relative: 1 %
HCT: 34.9 % — ABNORMAL LOW (ref 39.0–52.0)
Hemoglobin: 11.2 g/dL — ABNORMAL LOW (ref 13.0–17.0)
Immature Granulocytes: 0 %
Lymphocytes Relative: 7 %
Lymphs Abs: 0.7 10*3/uL (ref 0.7–4.0)
MCH: 29.6 pg (ref 26.0–34.0)
MCHC: 32.1 g/dL (ref 30.0–36.0)
MCV: 92.1 fL (ref 80.0–100.0)
Monocytes Absolute: 0.5 10*3/uL (ref 0.1–1.0)
Monocytes Relative: 5 %
Neutro Abs: 8.8 10*3/uL — ABNORMAL HIGH (ref 1.7–7.7)
Neutrophils Relative %: 87 %
Platelets: 159 10*3/uL (ref 150–400)
RBC: 3.79 MIL/uL — ABNORMAL LOW (ref 4.22–5.81)
RDW: 13.9 % (ref 11.5–15.5)
WBC: 10.2 10*3/uL (ref 4.0–10.5)
nRBC: 0 % (ref 0.0–0.2)

## 2022-06-11 LAB — URINALYSIS, ROUTINE W REFLEX MICROSCOPIC
Bilirubin Urine: NEGATIVE
Glucose, UA: NEGATIVE mg/dL
Hgb urine dipstick: NEGATIVE
Ketones, ur: NEGATIVE mg/dL
Leukocytes,Ua: NEGATIVE
Nitrite: NEGATIVE
Protein, ur: NEGATIVE mg/dL
Specific Gravity, Urine: 1.021 (ref 1.005–1.030)
pH: 6 (ref 5.0–8.0)

## 2022-06-11 LAB — RESP PANEL BY RT-PCR (FLU A&B, COVID) ARPGX2
Influenza A by PCR: NEGATIVE
Influenza B by PCR: NEGATIVE
SARS Coronavirus 2 by RT PCR: NEGATIVE

## 2022-06-11 LAB — SALICYLATE LEVEL: Salicylate Lvl: <7 mg/dL — ABNORMAL LOW (ref 7.0–30.0)

## 2022-06-11 LAB — ACETAMINOPHEN LEVEL: Acetaminophen (Tylenol), Serum: 15 ug/mL (ref 10–30)

## 2022-06-11 LAB — ETHANOL: Alcohol, Ethyl (B): <10 mg/dL (ref <10)

## 2022-06-11 MED ORDER — VITAMIN D 25 MCG (1000 UNIT) PO TABS: 2000.0000 [IU] | ORAL_TABLET | Freq: Every day | ORAL | Status: DC

## 2022-06-11 MED ORDER — ROPINIROLE HCL 1 MG PO TABS
2.0000 mg | ORAL_TABLET | Freq: Four times a day (QID) | ORAL | Status: DC
  Administered 2022-06-11: 2 mg via ORAL
  Filled 2022-06-11 (×2): qty 2

## 2022-06-11 MED ORDER — SERTRALINE HCL 50 MG PO TABS: 100.0000 mg | ORAL_TABLET | Freq: Every day | ORAL | Status: DC

## 2022-06-11 MED ORDER — EZETIMIBE 10 MG PO TABS: 10.0000 mg | ORAL_TABLET | Freq: Every day | ORAL | Status: DC

## 2022-06-11 MED ORDER — RISPERIDONE 0.5 MG PO TABS: 0.5000 mg | ORAL_TABLET | Freq: Two times a day (BID) | ORAL | 0 refills | Status: DC

## 2022-06-11 MED ORDER — PANTOPRAZOLE SODIUM 40 MG PO TBEC: 40.0000 mg | DELAYED_RELEASE_TABLET | Freq: Every day | ORAL | Status: DC

## 2022-06-11 MED ORDER — ENTACAPONE 200 MG PO TABS
200.0000 mg | ORAL_TABLET | Freq: Three times a day (TID) | ORAL | Status: DC
  Administered 2022-06-11: 200 mg via ORAL
  Filled 2022-06-11: qty 1

## 2022-06-11 MED ORDER — RISPERIDONE 1 MG PO TABS
0.5000 mg | ORAL_TABLET | Freq: Two times a day (BID) | ORAL | Status: DC
  Administered 2022-06-11: 0.5 mg via ORAL
  Filled 2022-06-11: qty 1

## 2022-06-11 MED ORDER — ASPIRIN 81 MG PO TBEC: 81.0000 mg | DELAYED_RELEASE_TABLET | Freq: Every day | ORAL | Status: DC

## 2022-06-11 MED ORDER — FLUTICASONE PROPIONATE 50 MCG/ACT NA SUSP
1.0000 | Freq: Every day | NASAL | Status: DC
Start: 2022-06-12 — End: 2022-06-11
  Filled 2022-06-11: qty 16

## 2022-06-11 MED ORDER — CARBIDOPA-LEVODOPA 25-100 MG PO TABS
1.0000 | ORAL_TABLET | Freq: Four times a day (QID) | ORAL | Status: DC
  Administered 2022-06-11: 1 via ORAL
  Filled 2022-06-11 (×2): qty 1

## 2022-06-11 MED ORDER — CAPSAICIN 0.025 % EX CREA
1.0000 | TOPICAL_CREAM | Freq: Two times a day (BID) | CUTANEOUS | Status: DC
Start: 2022-06-11 — End: 2022-06-11
  Filled 2022-06-11: qty 1

## 2022-06-11 MED ORDER — VITAMIN B-12 1000 MCG PO TABS: 1000.0000 ug | ORAL_TABLET | Freq: Every day | ORAL | Status: DC

## 2022-06-11 MED ORDER — PSYLLIUM 95 % PO PACK: 1.0000 | PACK | Freq: Every day | ORAL | Status: DC

## 2022-06-11 NOTE — ED Notes (Signed)
Pt is A/Ox3 pt has all belongings accounted for by pt, Mr Knust left with POA [name] in POV [place] to [facility]. Follow up instructions, pt and POA verbalized understanding.

## 2022-06-11 NOTE — ED Provider Notes (Signed)
[facility] Provider Note    Event Date/Time   First MD Initiated Contact with Patient 06/11/22 1217     (approximate)   History   Psychiatric Evaluation   HPI  Corey Morrison is a 66 y.o. male with [name]'s, coronary disease, A-fib, schizoaffective disorder who comes in with concerns with hallucinations.  Patient reports having hallucinations where he sees things moving.  He denies any SI or HI.  He reports that he was sent here because they were worried that his hallucinations were getting worse at his facility.  [facility] from [facility].  Patient denies any other chest pain, shortness of breath, falls.  Patient is able to ambulate.  I called and got collateral from the facility. According the facility- Over the past few weeks he has been running away from the facilitate, had a piece of glass in the room and when asked what he was going to do he said " use it later". Throwing water bottles into the hallway, refusing med. He is refusing to see the doctor at the facilitate. No falls.       Physical Exam   Triage Vital Signs: ED Triage Vitals [06/11/22 1206]  Enc Vitals Group     BP (!) 95/58     Pulse Rate 79     Resp 18     Temp 98.3 F (36.8 C)     Temp src      SpO2 95 %     Weight      Height      Head Circumference      Peak Flow      Pain Score      Pain Loc      Pain Edu?      Excl. in GC?     Most recent vital signs: Vitals:   06/11/22 1206  BP: (!) 95/58  Pulse: 79  Resp: 18  Temp: 98.3 F (36.8 C)  SpO2: 95%     General: Awake, no distress.  CV:  Good peripheral perfusion.  Resp:  Normal effort.  Abd:  No distention.  Soft and nontender Other:  Alert and oriented x3.  Patient able to stand up and take a few steps with assistance but he uses a walker at baseline.  Cranial nerves appear intact.   ED Results / Procedures / Treatments   Labs (all labs ordered are listed, but only abnormal results  are displayed) Labs Reviewed  RESP PANEL BY RT-PCR (FLU A&B, COVID) ARPGX2  RESP PANEL BY RT-PCR (FLU A&B, COVID) ARPGX2  COMPREHENSIVE METABOLIC PANEL  ETHANOL  URINE DRUG SCREEN, QUALITATIVE ([facility] ONLY)  CBC WITH DIFFERENTIAL/PLATELET  SALICYLATE LEVEL  ACETAMINOPHEN LEVEL  URINALYSIS, ROUTINE W REFLEX MICROSCOPIC     RADIOLOGY I have reviewed the CT head from 05/28/2022 personally and interpreted no evidence of intercranial hemorrhage or mass   PROCEDURES:  Critical Care performed: No  Procedures   MEDICATIONS ORDERED IN ED: Medications - No data to display   IMPRESSION / MDM / ASSESSMENT AND PLAN / ED COURSE  I reviewed the triage vital signs and the nursing notes.   Patient's presentation is most consistent with acute presentation with potential threat to life or bodily function.   Pt is without any acute medical complaints. No exam findings to suggest medical cause of current presentation. Will order psychiatric screening labs and discuss further w/ psychiatric service.  Patient had recent CT head that was reassuring but will get  labs and UA to look for any other cause for hallucinations other than primary neurological or psychiatric reason.  D/d includes but is not limited to psychiatric disease, behavioral/personality disorder, inadequate socioeconomic support, medical.  Based on HPI, exam, unremarkable labs, no concern for acute medical problem at this time. No rigidity, clonus, hyperthermia, focal neurologic deficit, diaphoresis, tachycardia, meningismus, ataxia, gait abnormality or other finding to suggest this visit represents a non-psychiatric problem. Screening labs reviewed.    Given this, pt medically cleared, to be dispositioned per Psych.   Cbc hemoglobin stable Tylenol in therapeutic range-patient has as needed Tylenol ordered COVID-negative CMP normal LFTs Alcohol negative  The patient has been placed in psychiatric observation due to the need to  provide a safe environment for the patient while obtaining psychiatric consultation and evaluation, as well as ongoing medical and medication management to treat the patient's condition.  The patient has not been placed under full IVC at this time.    The patient is on the cardiac monitor to evaluate for evidence of arrhythmia and/or significant heart rate changes.      FINAL CLINICAL IMPRESSION(S) / ED DIAGNOSES   Final diagnoses:  Hallucinations     Rx / DC Orders   ED Discharge Orders     None        Note:  This document was prepared using Dragon voice recognition software and may include unintentional dictation errors.   Concha Se, MD 06/11/22 1431

## 2022-06-11 NOTE — Consult Note (Signed)
[facility] Face-to-Face Psychiatry Consult   Reason for Consult:  hallucinations Referring Physician:  EDP Patient Identification: Corey Morrison MRN:  [id number] Principal Diagnosis: [name]'s disease [facility]) Diagnosis:  Principal Problem:   [name]'s disease (HCC)   Total Time spent with patient: 45 minutes  Subjective:   Corey Morrison is a 66 y.o. male patient admitted with hallucinations, psych evaluation.  HPI:  66 yo male presented from his assisted living facility for hallucinations and a psych evaluation.  Evidently they want a sitter there and either the doctor or the patient would not meet with each other, notes saying both ways.  Regardless, the client is calm and cooperative.  Her reports his legs are hurting, upper part.  Depression is "I'm sure I am", denies suicidal ideations.  No homicidal ideations.  Hallucinations present and "comes and goes" per his POA.  Today, he reports seeing thing in slow motion and voices telling him he should be in the government.  No distress noted by the client with the hallucinations.  Discussed medications with his POA and agree with starting Risperdal 0.5 mg BID to assist.  Client is psychiatrically cleared for discharge.  Past Psychiatric History: [name]'s disease, depression, hallucinations  Risk to Self:   Risk to Others:   Prior Inpatient Therapy:   Prior Outpatient Therapy:    Past Medical History:  Past Medical History:  Diagnosis Date   A-fib (HCC)    Anxiety disorder    Coronary artery disease    Hyperlipidemia    [name] disease (HCC)    Vitamin B12 deficiency     Past Surgical History:  Procedure Laterality Date   CARDIAC CATHETERIZATION     Family History:  Family History  Problem Relation Age of Onset   Heart Problems Father    Family Psychiatric  History: none Social History:  Social History   Substance and Sexual Activity  Alcohol Use Never     Social History   Substance and Sexual Activity  Drug Use Never     Social History   Socioeconomic History   Marital status: Married    Spouse name: Not on file   Number of children: Not on file   Years of education: Not on file   Highest education level: Not on file  Occupational History   Not on file  Tobacco Use   Smoking status: Former   Smokeless tobacco: Never  Substance and Sexual Activity   Alcohol use: Never   Drug use: Never   Sexual activity: Not Currently  Other Topics Concern   Not on file  Social History Narrative   Not on file   Social Determinants of Health   Financial Resource Strain: Not on file  Food Insecurity: Not on file  Transportation Needs: Not on file  Physical Activity: Not on file  Stress: Not on file  Social Connections: Not on file   Additional Social History:    Allergies:  No Known Allergies  Labs:  Results for orders placed or performed during the hospital encounter of 06/11/22 (from the past 48 hour(s))  Resp Panel by RT-PCR (Flu A&B, Covid) Anterior Nasal Swab     Status: None   Collection Time: 06/11/22 12:35 PM   Specimen: Anterior Nasal Swab  Result Value Ref Range   SARS Coronavirus 2 by RT PCR NEGATIVE NEGATIVE    Comment: (NOTE) SARS-CoV-2 target nucleic acids are NOT DETECTED.  The SARS-CoV-2 RNA is generally detectable in upper respiratory specimens during the acute phase of infection. The  lowest concentration of SARS-CoV-2 viral copies this assay can detect is 138 copies/mL. A negative result does not preclude SARS-Cov-2 infection and should not be used as the sole basis for treatment or other patient management decisions. A negative result may occur with  improper specimen collection/handling, submission of specimen other than nasopharyngeal swab, presence of viral mutation(s) within the areas targeted by this assay, and inadequate number of viral copies(<138 copies/mL). A negative result must be combined with clinical observations, patient history, and  epidemiological information. The expected result is Negative.  Fact Sheet for Patients:  BloggerCourse.com  Fact Sheet for Healthcare Providers:  SeriousBroker.it  This test is no t yet approved or cleared by the [place] FDA and  has been authorized for detection and/or diagnosis of SARS-CoV-2 by FDA under an Emergency Use Authorization (EUA). This EUA will remain  in effect (meaning this test can be used) for the duration of the COVID-19 declaration under Section 564(b)(1) of the Act, 21 U.S.C.section 360bbb-3(b)(1), unless the authorization is terminated  or revoked sooner.       Influenza A by PCR NEGATIVE NEGATIVE   Influenza B by PCR NEGATIVE NEGATIVE    Comment: (NOTE) The Xpert Xpress SARS-CoV-2/FLU/RSV plus assay is intended as an aid in the diagnosis of influenza from Nasopharyngeal swab specimens and should not be used as a sole basis for treatment. Nasal washings and aspirates are unacceptable for Xpert Xpress SARS-CoV-2/FLU/RSV testing.  Fact Sheet for Patients: BloggerCourse.com  Fact Sheet for Healthcare Providers: SeriousBroker.it  This test is not yet approved or cleared by the [place] FDA and has been authorized for detection and/or diagnosis of SARS-CoV-2 by FDA under an Emergency Use Authorization (EUA). This EUA will remain in effect (meaning this test can be used) for the duration of the COVID-19 declaration under Section 564(b)(1) of the Act, 21 U.S.C. section 360bbb-3(b)(1), unless the authorization is terminated or revoked.  Performed at [facility], [address] [place], [place] [address]   Comprehensive metabolic panel     Status: Abnormal   Collection Time: 06/11/22 12:35 PM  Result Value Ref Range   Sodium 140 135 - 145 mmol/L   Potassium 4.2 3.5 - 5.1 mmol/L   Chloride 109 98 - 111 mmol/L   CO2 28 22 - 32 mmol/L    Glucose, Bld 103 (H) 70 - 99 mg/dL    Comment: Glucose reference range applies only to samples taken after fasting for at least 8 hours.   BUN 25 (H) 8 - 23 mg/dL   Creatinine, Ser 8.34 0.61 - 1.24 mg/dL   Calcium 8.7 (L) 8.9 - 10.3 mg/dL   Total Protein 6.3 (L) 6.5 - 8.1 g/dL   Albumin 3.5 3.5 - 5.0 g/dL   AST 11 (L) 15 - 41 U/L   ALT 12 0 - 44 U/L   Alkaline Phosphatase 57 38 - 126 U/L   Total Bilirubin 0.6 0.3 - 1.2 mg/dL   GFR, Estimated >19 >62 mL/min    Comment: (NOTE) Calculated using the CKD-EPI Creatinine Equation (2021)    Anion gap 3 (L) 5 - 15    Comment: Performed at [facility], [address] [place], [place] [address]  Ethanol     Status: None   Collection Time: 06/11/22 12:35 PM  Result Value Ref Range   Alcohol, Ethyl (B) <10 <10 mg/dL    Comment: (NOTE) Lowest detectable limit for serum alcohol is 10 mg/dL.  For medical purposes only. Performed at [employer]  [facility], [address] [place], [place] [address]   CBC with Diff     Status: Abnormal   Collection Time: 06/11/22 12:35 PM  Result Value Ref Range   WBC 10.2 4.0 - 10.5 K/uL   RBC 3.79 (L) 4.22 - 5.81 MIL/uL   Hemoglobin 11.2 (L) 13.0 - 17.0 g/dL   HCT 17.0 (L) 01.7 - 49.4 %   MCV 92.1 80.0 - 100.0 fL   MCH 29.6 26.0 - 34.0 pg   MCHC 32.1 30.0 - 36.0 g/dL   RDW 49.6 75.9 - 16.3 %   Platelets 159 150 - 400 K/uL   nRBC 0.0 0.0 - 0.2 %   Neutrophils Relative % 87 %   Neutro Abs 8.8 (H) 1.7 - 7.7 K/uL   Lymphocytes Relative 7 %   Lymphs Abs 0.7 0.7 - 4.0 K/uL   Monocytes Relative 5 %   Monocytes Absolute 0.5 0.1 - 1.0 K/uL   Eosinophils Relative 1 %   Eosinophils Absolute 0.1 0.0 - 0.5 K/uL   Basophils Relative 0 %   Basophils Absolute 0.0 0.0 - 0.1 K/uL   Immature Granulocytes 0 %   Abs Immature Granulocytes 0.03 0.00 - 0.07 K/uL    Comment: Performed at [facility], [address] [place], [place] [address]  Salicylate level     Status: Abnormal   Collection  Time: 06/11/22 12:35 PM  Result Value Ref Range   Salicylate Lvl <7.0 (L) 7.0 - 30.0 mg/dL    Comment: Performed at [facility], [address] [place], [place] [address]  Acetaminophen level     Status: None   Collection Time: 06/11/22 12:35 PM  Result Value Ref Range   Acetaminophen (Tylenol), Serum 15 10 - 30 ug/mL    Comment: (NOTE) Therapeutic concentrations vary significantly. A range of 10-30 ug/mL  may be an effective concentration for many patients. However, some  are best treated at concentrations outside of this range. Acetaminophen concentrations >150 ug/mL at 4 hours after ingestion  and >50 ug/mL at 12 hours after ingestion are often associated with  toxic reactions.  Performed at [facility], [address] [place], [place] [address]     Current Facility-Administered Medications  Medication Dose Route Frequency Provider Last Rate Last Daun Peacock ON 06/12/2022] aspirin EC tablet 81 mg  81 mg Oral Daily Tressie Ellis, RPH       capsaicin (ZOSTRIX) 0.025 % cream 1 Application  1 Application Topical BID Concha Se, MD       carbidopa-levodopa (SINEMET IR) 25-100 MG per tablet immediate release 1 tablet  1 tablet Oral QID Concha Se, MD   1 tablet at 06/11/22 1503   [START ON 06/12/2022] cholecalciferol (VITAMIN D3) tablet 2,000 Units  2,000 Units Oral Daily Concha Se, MD       entacapone Coralie Carpen) tablet 200 mg  200 mg Oral TID Concha Se, MD   200 mg at 06/11/22 1506   [START ON 06/12/2022] ezetimibe (ZETIA) tablet 10 mg  10 mg Oral Daily Tressie Ellis, RPH       [START ON 06/12/2022] fluticasone (FLONASE) 50 MCG/ACT nasal spray 1 spray  1 spray Each Nare Daily Concha Se, MD       [START ON 06/12/2022] pantoprazole (PROTONIX) EC tablet 40 mg  40 mg Oral Daily Dorothea Ogle B, RPH       psyllium (HYDROCIL/METAMUCIL) 1 packet  1 packet Oral QHS Fuller Plan,  Alben Spittle, MD       risperiDONE (RISPERDAL) tablet 0.5 mg  0.5 mg Oral BID Charm Rings, NP       rOPINIRole (REQUIP) tablet 2 mg  2 mg Oral QID Concha Se, MD   2 mg at 06/11/22 1503   sertraline (ZOLOFT) tablet 100 mg  100 mg Oral QHS Concha Se, MD       [START ON 06/12/2022] vitamin B-12 (CYANOCOBALAMIN) tablet 1,000 mcg  1,000 mcg Oral Daily Concha Se, MD       Current Outpatient Medications  Medication Sig Dispense Refill   acetaminophen (TYLENOL) 325 MG tablet Take 650 mg by mouth every 6 (six) hours as needed for mild pain or moderate pain.     aspirin 81 MG EC tablet Take 81 mg by mouth daily.     capsicum (ZOSTRIX) 0.075 % topical cream Apply 1 Application topically 2 (two) times daily. (Apply to feet and thighs)     capsicum (ZOSTRIX) 0.075 % topical cream Apply 1 Application topically every 12 (twelve) hours as needed (pain/tingling in feet and/or thighs).     carbidopa-levodopa (SINEMET IR) 25-100 MG tablet Take 1 tablet by mouth 4 (four) times daily.     Cholecalciferol (VITAMIN D) 50 MCG (2000 UT) CAPS Take 2,000 Units by mouth daily.     entacapone (COMTAN) 200 MG tablet Take 200 mg by mouth 3 (three) times daily.     esomeprazole (NEXIUM) 20 MG capsule Take 20 mg by mouth daily.     ezetimibe (ZETIA) 10 MG tablet Take 10 mg by mouth daily.     fluticasone (FLONASE) 50 MCG/ACT nasal spray Place 1 spray into both nostrils daily.     psyllium (REGULOID) 0.52 g capsule Take 0.52 g by mouth at bedtime.     rOPINIRole (REQUIP) 2 MG tablet Take 2 mg by mouth 4 (four) times daily.     sertraline (ZOLOFT) 100 MG tablet Take 100 mg by mouth at bedtime.     vitamin B-12 (CYANOCOBALAMIN) 1000 MCG tablet Take 1,000 mcg by mouth daily.      Musculoskeletal: Strength & Muscle Tone: decreased Gait & Station:  did not witness Patient leans: N/A  Psychiatric Specialty Exam: Physical Exam Vitals and nursing note reviewed.  Constitutional:      Appearance: Normal appearance.  HENT:     Head: Normocephalic.     Nose: Nose normal.  Pulmonary:      Effort: Pulmonary effort is normal.  Musculoskeletal:        General: Normal range of motion.     Cervical back: Normal range of motion.  Neurological:     General: No focal deficit present.     Mental Status: He is alert and oriented to person, place, and time.  Psychiatric:        Attention and Perception: Attention normal. He perceives auditory and visual hallucinations.        Mood and Affect: Mood is depressed. Affect is blunt.        Speech: Speech normal.        Behavior: Behavior normal. Behavior is cooperative.        Thought Content: Thought content normal.        Cognition and Memory: Cognition and memory normal.        Judgment: Judgment normal.     Review of Systems  Psychiatric/Behavioral:  Positive for depression and hallucinations.   All other systems reviewed and are negative.  Blood pressure 125/73, pulse 82, temperature 98.4 F (36.9 C), temperature source Oral, resp. rate 18, height 5\' 9"  (1.753 m), weight 70.3 kg, SpO2 98 %.Body mass index is 22.89 kg/m.  General Appearance: Casual  Eye Contact:  Good  Speech:  Slow and difficult to understand at times  Volume:  Decreased  Mood:  Anxious and Depressed  Affect:  Blunt  Thought Process:  Coherent  Orientation:  Other:  person and place  Thought Content:  Hallucinations: Auditory Visual  Suicidal Thoughts:  No  Homicidal Thoughts:  No  Memory:  Immediate;   Fair Recent;   Fair Remote;   Fair  Judgement:  Fair  Insight:  Fair  Psychomotor Activity:  Decreased  Concentration:  Concentration: Fair and Attention Span: Fair  Recall:  [employer]  Fund of Knowledge:  Fair  Language:  Fair  Akathisia:  No  Handed:  Right  AIMS (if indicated):     Assets:  Housing Leisure Time Resilience Social Support  ADL's:  Impaired  Cognition:  Impaired,  Mild  Sleep:        Physical Exam: Physical Exam Vitals and nursing note reviewed.  Constitutional:      Appearance: Normal appearance.  HENT:     Head:  Normocephalic.     Nose: Nose normal.  Pulmonary:     Effort: Pulmonary effort is normal.  Musculoskeletal:        General: Normal range of motion.     Cervical back: Normal range of motion.  Neurological:     General: No focal deficit present.     Mental Status: He is alert and oriented to person, place, and time.  Psychiatric:        Attention and Perception: Attention normal. He perceives auditory and visual hallucinations.        Mood and Affect: Mood is depressed. Affect is blunt.        Speech: Speech normal.        Behavior: Behavior normal. Behavior is cooperative.        Thought Content: Thought content normal.        Cognition and Memory: Cognition and memory normal.        Judgment: Judgment normal.    Review of Systems  Psychiatric/Behavioral:  Positive for depression and hallucinations.   All other systems reviewed and are negative.  Blood pressure 125/73, pulse 82, temperature 98.4 F (36.9 C), temperature source Oral, resp. rate 18, height 5\' 9"  (1.753 m), weight 70.3 kg, SpO2 98 %. Body mass index is 22.89 kg/m.  Treatment Plan Summary: [name]'s disease, hallucinations: Started Risperdal 0.5 mg BID  Disposition: No evidence of imminent risk to self or others at present.   Patient does not meet criteria for psychiatric inpatient admission.  Nanine MeansJamison Mckinzee Spirito, NP 06/11/2022 3:21 PM

## 2022-06-11 NOTE — Discharge Instructions (Addendum)
You have been seen in the emergency department for a  psychiatric concern. You have been evaluated both medically as well as psychiatrically. Please follow-up with your outpatient resources provided. Return to the emergency department for any worsening symptoms, or any thoughts of hurting yourself or anyone else so that we may attempt to help you. 

## 2022-06-11 NOTE — ED Notes (Signed)
Called care home as progress notes sent does not have surrounding event listed. Late event listed is from 06/03/2022 @1458 .  Spoke to , he does not know of any recent medication changes.

## 2022-06-11 NOTE — ED Triage Notes (Signed)
Pt to ED via [facility] from [facility]. Per EMS pt did not want to speak with psychologist at [facility] [place] and was sent for a psychiatric evaluation, pt reports some confusion and disorientation.

## 2022-06-11 NOTE — BH Assessment (Signed)
Comprehensive Clinical Assessment (CCA) Screening, Triage and Referral Note  06/11/2022 Corey Morrison [id number]  Chief Complaint:  Chief Complaint  Patient presents with   Psychiatric Evaluation   Visit Diagnosis: [name]'s disease   Corey Morrison is a 66 year old male who presents to the ER for psychiatric evaluation. It's unclear if the patient refused to see the doctor. Patient reports of having SI but no denies any plans or desire to end his life. He reports of having AV/H and denies HI.  Patient Reported Information How did you hear about [place]? Other (Comment)  What Is the Reason for Your Visit/Call Today? Patient's facility sent him the ER for psych evaluation.  How Long Has This Been Causing You Problems? <Week  What Do You Feel Would Help You the Most Today? Treatment for Depression or other mood problem   Have You Recently Had Any Thoughts About Hurting Yourself? No  Are You Planning to Commit Suicide/Harm Yourself At This time? No   Have you Recently Had Thoughts About Hurting Someone Karolee Ohs? No  Are You Planning to Harm Someone at This Time? No  Explanation: No data recorded  Have You Used Any Alcohol or Drugs in the Past 24 Hours? No  How Long Ago Did You Use Drugs or Alcohol? No data recorded What Did You Use and How Much? No data recorded  Do You Currently Have a Therapist/Psychiatrist? No  Name of Therapist/Psychiatrist: No data recorded  Have You Been Recently Discharged From Any Office Practice or Programs? No  Explanation of Discharge From Practice/Program: No data recorded   CCA Screening Triage Referral Assessment Type of Contact: Face-to-Face  Telemedicine Service Delivery:   Is this Initial or Reassessment? No data recorded Date Telepsych consult ordered in [facility]:  No data recorded Time Telepsych consult ordered in [facility]:  No data recorded Location of Assessment: [facility] ED  Provider Location: [facility] ED   Collateral Involvement: [name],  caregiver and close friend   Does Patient Have a Court Appointed Legal Guardian? No data recorded Name and Contact of Legal Guardian: No data recorded If Minor and Not Living with Parent(s), Who has Custody? n/a  Is CPS involved or ever been involved? Never  Is APS involved or ever been involved? Never   Patient Determined To Be At Risk for Harm To Self or Others Based on Review of Patient Reported Information or Presenting Complaint? No  Method: No data recorded Availability of Means: No data recorded Intent: No data recorded Notification Required: No data recorded Additional Information for Danger to Others Potential: No data recorded Additional Comments for Danger to Others Potential: No data recorded Are There Guns or Other Weapons in Your Home? No data recorded Types of Guns/Weapons: No data recorded Are These Weapons Safely Secured?                            No data recorded Who Could Verify You Are Able To Have These Secured: No data recorded Do You Have any Outstanding Charges, Pending Court Dates, Parole/Probation? No data recorded Contacted To Inform of Risk of Harm To Self or Others: No data recorded  Does Patient Present under Involuntary Commitment? No  IVC Papers Initial File Date: No data recorded  [place] of Residence: [place]   Patient Currently Receiving the Following Services: Medication Management; Skilled Nursing Facility   Determination of Need: Emergent (2 hours)   Options For Referral: Inpatient Hospitalization; Medication Management   Discharge Disposition:  Lilyan Gilford MS, LCAS, [facility], [facility] Therapeutic Triage Specialist 06/11/2022 4:22 PM

## 2022-06-11 NOTE — ED Notes (Signed)
Patient received dinner tray and a drink. 

## 2022-06-11 NOTE — ED Provider Notes (Signed)
-----------------------------------------   4:15 PM on 06/11/2022 ----------------------------------------- Patient care assumed from Dr. Fuller Plan.  Patient's work-up has been overall reassuring.  Urinalysis has resulted negative.  COVID and flu negative, reassuring chemistry, reassuring CBC and a negative alcohol level.  Patient has been seen and evaluated by psychiatry and has been cleared from a psychiatric standpoint.  No concerning findings on medical evaluation.  We will discharge back to the patient's care facility.   Minna Antis, MD 06/11/22 1615

## 2022-06-11 NOTE — ED Notes (Signed)
Hospital meal provided, pt tolerated w/o complaints.  Waste discarded appropriately.  

## 2022-06-22 ENCOUNTER — Observation Stay: Payer: Medicare Other

## 2022-06-22 ENCOUNTER — Inpatient Hospital Stay
Admission: EM | Admit: 2022-06-22 | Discharge: 2022-06-29 | DRG: 057 | Disposition: A | Discharge status: Home Health | Payer: Medicare Other | Source: Skilled Nursing Facility | Attending: Internal Medicine | Admitting: Internal Medicine

## 2022-06-22 ENCOUNTER — Emergency Department: Payer: Medicare Other

## 2022-06-22 ENCOUNTER — Encounter: Payer: Self-pay | Admitting: *Deleted

## 2022-06-22 ENCOUNTER — Other Ambulatory Visit: Payer: Self-pay

## 2022-06-22 DIAGNOSIS — R42 Dizziness and giddiness: Secondary | ICD-10-CM | POA: Diagnosis present

## 2022-06-22 DIAGNOSIS — G20A1 Parkinson's disease without dyskinesia, without mention of fluctuations: Secondary | ICD-10-CM | POA: Diagnosis present

## 2022-06-22 DIAGNOSIS — M79605 Pain in left leg: Secondary | ICD-10-CM | POA: Diagnosis present

## 2022-06-22 DIAGNOSIS — R531 Weakness: Secondary | ICD-10-CM

## 2022-06-22 DIAGNOSIS — G20 Parkinson's disease: Principal | ICD-10-CM | POA: Diagnosis present

## 2022-06-22 DIAGNOSIS — I480 Paroxysmal atrial fibrillation: Secondary | ICD-10-CM | POA: Diagnosis present

## 2022-06-22 DIAGNOSIS — E538 Deficiency of other specified B group vitamins: Secondary | ICD-10-CM | POA: Diagnosis present

## 2022-06-22 DIAGNOSIS — E785 Hyperlipidemia, unspecified: Secondary | ICD-10-CM | POA: Diagnosis present

## 2022-06-22 DIAGNOSIS — R200 Anesthesia of skin: Secondary | ICD-10-CM | POA: Diagnosis present

## 2022-06-22 DIAGNOSIS — I10 Essential (primary) hypertension: Secondary | ICD-10-CM | POA: Diagnosis present

## 2022-06-22 DIAGNOSIS — I951 Orthostatic hypotension: Secondary | ICD-10-CM | POA: Diagnosis not present

## 2022-06-22 DIAGNOSIS — I2510 Atherosclerotic heart disease of native coronary artery without angina pectoris: Secondary | ICD-10-CM | POA: Diagnosis present

## 2022-06-22 DIAGNOSIS — I447 Left bundle-branch block, unspecified: Secondary | ICD-10-CM | POA: Diagnosis present

## 2022-06-22 DIAGNOSIS — G4700 Insomnia, unspecified: Secondary | ICD-10-CM | POA: Diagnosis present

## 2022-06-22 DIAGNOSIS — Z955 Presence of coronary angioplasty implant and graft: Secondary | ICD-10-CM

## 2022-06-22 DIAGNOSIS — R443 Hallucinations, unspecified: Secondary | ICD-10-CM | POA: Diagnosis present

## 2022-06-22 DIAGNOSIS — Z79899 Other long term (current) drug therapy: Secondary | ICD-10-CM

## 2022-06-22 DIAGNOSIS — R262 Difficulty in walking, not elsewhere classified: Secondary | ICD-10-CM | POA: Diagnosis present

## 2022-06-22 DIAGNOSIS — Z87891 Personal history of nicotine dependence: Secondary | ICD-10-CM

## 2022-06-22 DIAGNOSIS — D649 Anemia, unspecified: Secondary | ICD-10-CM | POA: Diagnosis present

## 2022-06-22 DIAGNOSIS — Z7982 Long term (current) use of aspirin: Secondary | ICD-10-CM

## 2022-06-22 DIAGNOSIS — R451 Restlessness and agitation: Secondary | ICD-10-CM | POA: Diagnosis present

## 2022-06-22 DIAGNOSIS — R058 Other specified cough: Secondary | ICD-10-CM | POA: Diagnosis present

## 2022-06-22 DIAGNOSIS — F419 Anxiety disorder, unspecified: Secondary | ICD-10-CM | POA: Diagnosis present

## 2022-06-22 DIAGNOSIS — G249 Dystonia, unspecified: Secondary | ICD-10-CM | POA: Diagnosis present

## 2022-06-22 DIAGNOSIS — R059 Cough, unspecified: Secondary | ICD-10-CM | POA: Diagnosis present

## 2022-06-22 DIAGNOSIS — I959 Hypotension, unspecified: Secondary | ICD-10-CM | POA: Diagnosis not present

## 2022-06-22 LAB — RETICULOCYTES
Immature Retic Fract: 8.9 % (ref 2.3–15.9)
RBC.: 3.87 MIL/uL — ABNORMAL LOW (ref 4.22–5.81)
Retic Count, Absolute: 47.2 10*3/uL (ref 19.0–186.0)
Retic Ct Pct: 1.2 % (ref 0.4–3.1)

## 2022-06-22 LAB — URINALYSIS, ROUTINE W REFLEX MICROSCOPIC
Bilirubin Urine: NEGATIVE
Glucose, UA: NEGATIVE mg/dL
Hgb urine dipstick: NEGATIVE
Ketones, ur: 5 mg/dL — AB
Leukocytes,Ua: NEGATIVE
Nitrite: NEGATIVE
Protein, ur: NEGATIVE mg/dL
Specific Gravity, Urine: 1.015 (ref 1.005–1.030)
pH: 6 (ref 5.0–8.0)

## 2022-06-22 LAB — IRON AND TIBC
Iron: 91 ug/dL (ref 45–182)
Saturation Ratios: 31 % (ref 17.9–39.5)
TIBC: 298 ug/dL (ref 250–450)
UIBC: 207 ug/dL

## 2022-06-22 LAB — BASIC METABOLIC PANEL
Anion gap: 8 (ref 5–15)
BUN: 17 mg/dL (ref 8–23)
CO2: 28 mmol/L (ref 22–32)
Calcium: 8.6 mg/dL — ABNORMAL LOW (ref 8.9–10.3)
Chloride: 108 mmol/L (ref 98–111)
Creatinine, Ser: 1.11 mg/dL (ref 0.61–1.24)
GFR, Estimated: >60 mL/min (ref >60)
Glucose, Bld: 104 mg/dL — ABNORMAL HIGH (ref 70–99)
Potassium: 3.8 mmol/L (ref 3.5–5.1)
Sodium: 144 mmol/L (ref 135–145)

## 2022-06-22 LAB — FERRITIN: Ferritin: 74 ng/mL (ref 24–336)

## 2022-06-22 LAB — CBC
HCT: 36 % — ABNORMAL LOW (ref 39.0–52.0)
Hemoglobin: 11.4 g/dL — ABNORMAL LOW (ref 13.0–17.0)
MCH: 29.4 pg (ref 26.0–34.0)
MCHC: 31.7 g/dL (ref 30.0–36.0)
MCV: 92.8 fL (ref 80.0–100.0)
Platelets: 191 10*3/uL (ref 150–400)
RBC: 3.88 MIL/uL — ABNORMAL LOW (ref 4.22–5.81)
RDW: 13.6 % (ref 11.5–15.5)
WBC: 8 10*3/uL (ref 4.0–10.5)
nRBC: 0 % (ref 0.0–0.2)

## 2022-06-22 LAB — TROPONIN I (HIGH SENSITIVITY): Troponin I (High Sensitivity): 6 ng/L (ref <18)

## 2022-06-22 LAB — FOLATE: Folate: 18.5 ng/mL (ref >5.9)

## 2022-06-22 MED ORDER — ENTACAPONE 200 MG PO TABS
200.0000 mg | ORAL_TABLET | Freq: Every day | ORAL | Status: DC
Start: 2022-06-22 — End: 2022-06-23
  Administered 2022-06-22 – 2022-06-23 (×3): 200 mg via ORAL
  Filled 2022-06-22 (×7): qty 1

## 2022-06-22 MED ORDER — CITALOPRAM HYDROBROMIDE 20 MG PO TABS
40.0000 mg | ORAL_TABLET | Freq: Every day | ORAL | Status: DC
  Administered 2022-06-23 – 2022-06-29 (×7): 40 mg via ORAL
  Filled 2022-06-22 (×7): qty 2

## 2022-06-22 MED ORDER — CARBIDOPA-LEVODOPA 25-100 MG PO TABS
1.0000 | ORAL_TABLET | Freq: Every day | ORAL | Status: DC
  Administered 2022-06-22 – 2022-06-23 (×3): 1 via ORAL
  Filled 2022-06-22 (×3): qty 1

## 2022-06-22 MED ORDER — ENOXAPARIN SODIUM 40 MG/0.4ML IJ SOSY
40.0000 mg | PREFILLED_SYRINGE | INTRAMUSCULAR | Status: DC
  Administered 2022-06-23 – 2022-06-29 (×7): 40 mg via SUBCUTANEOUS
  Filled 2022-06-22 (×7): qty 0.4

## 2022-06-22 MED ORDER — SODIUM CHLORIDE 0.9 % IV BOLUS
1000.0000 mL | Freq: Once | INTRAVENOUS | Status: AC
  Administered 2022-06-22: 1000 mL via INTRAVENOUS

## 2022-06-22 NOTE — ED Triage Notes (Signed)
First Nurse Note:  Pt via EMS from [facility]. Staff states pt was dizzy. Pt was walking and got dizzy and started "shaking." Pt has a hx of [name]'s Disease. Denies LOC. Pt is A&OX4 and NAD  91/51 initial. EMS gave of fluid and it was 91/60  18G L AC LBBB on 12-lead EKG  154 CBG 98%  75 HR

## 2022-06-22 NOTE — ED Triage Notes (Signed)
Pt brought in via ems from [facility].  Pt reports low blood pressure.  Pt has dizziness.  Pt has mid back pain.  Pt did not fall.  Iv infusing.  Pt alert  hx parkinsons

## 2022-06-22 NOTE — H&P (Signed)
History and Physical    Corey Morrison [id number] DOB: 1956/02/12 DOA: 06/22/2022  PCP: Orvis Brill, [facility]  Patient coming from: Assisted living facility  Chief Complaint: Difficulty walking.  Low blood pressure.  HPI: Corey Morrison is a 66 y.o. male with history of [name]'s disease, paroxysmal atrial fibrillation was brought to the ER after patient was found to have low blood pressure at the assisted living facility.  Patient denies any dizziness loss of consciousness chest pain or palpitation.  Patient has been having hallucinations recently and patient's neurologist stopped his Requip last week and changed his dose of Sinemet and entacapone.  Patient states since then he has been finding it difficult to ambulate.  Denies any fall.  Denies any focal deficits.  ED Course: In the ER on sitting position blood pressure was 112/77 pulse 95/min.  Patient was finding it difficult to ambulate.  CT head was unremarkable.  Admitted for further observation.  EKG shows normal sinus rhythm LBBB.  Review of Systems: As per HPI, rest all negative.   Past Medical History:  Diagnosis Date   A-fib ([place])    Anxiety disorder    Coronary artery disease    Hyperlipidemia    [name] disease (HCC)    Vitamin B12 deficiency     Past Surgical History:  Procedure Laterality Date   CARDIAC CATHETERIZATION       reports that he has quit smoking. He has never used smokeless tobacco. He reports that he does not drink alcohol and does not use drugs.  No Known Allergies  Family History  Problem Relation Age of Onset   Heart Problems Father     Prior to Admission medications   Medication Sig Start Date End Date Taking? Authorizing Provider  acetaminophen (TYLENOL) 325 MG tablet Take 650 mg by mouth every 6 (six) hours as needed for mild pain or moderate pain.    [provider]  aspirin 81 MG EC tablet Take 81 mg by mouth daily.    [provider]  capsicum (ZOSTRIX)  0.075 % topical cream Apply 1 Application topically 2 (two) times daily. (Apply to feet and thighs)    [provider]  capsicum (ZOSTRIX) 0.075 % topical cream Apply 1 Application topically every 12 (twelve) hours as needed (pain/tingling in feet and/or thighs).    [provider]  carbidopa-levodopa (SINEMET IR) 25-100 MG tablet Take 1 tablet by mouth 4 (four) times daily.    [provider]  Cholecalciferol (VITAMIN D) 50 MCG (2000 UT) CAPS Take 2,000 Units by mouth daily.    [provider]  entacapone (COMTAN) 200 MG tablet Take 200 mg by mouth 3 (three) times daily.    [provider]  esomeprazole (NEXIUM) 20 MG capsule Take 20 mg by mouth daily.    [provider]  ezetimibe (ZETIA) 10 MG tablet Take 10 mg by mouth daily.    [provider]  fluticasone (FLONASE) 50 MCG/ACT nasal spray Place 1 spray into both nostrils daily.    [provider]  psyllium (REGULOID) 0.52 g capsule Take 0.52 g by mouth at bedtime.    [provider]  risperiDONE (RISPERDAL) 0.5 MG tablet Take 1 tablet (0.5 mg total) by mouth 2 (two) times daily. 06/11/22 07/11/22  Patrecia Pour, NP  rOPINIRole (REQUIP) 2 MG tablet Take 2 mg by mouth 4 (four) times daily.    [provider]  sertraline (ZOLOFT) 100 MG tablet Take 100 mg by mouth at bedtime.  [provider]  vitamin B-12 (CYANOCOBALAMIN) 1000 MCG tablet Take 1,000 mcg by mouth daily.    [provider]    Physical Exam: Constitutional: Moderately built and nourished. Vitals:   06/22/22 2046 06/22/22 2110 06/22/22 2152 06/22/22 2223  BP:   (!) 144/84 112/77  Pulse:  97 96 95  Resp:  20 20 17   Temp: 98.2 F (36.8 C) 98.2 F (36.8 C)  98.3 F (36.8 C)  TempSrc: Oral Oral    SpO2:  100% 100% 100%  Weight:      Height:       Eyes: Anicteric no pallor. ENMT: No discharge from the ears eyes nose and mouth. Neck: No mass felt.  No neck  rigidity. Respiratory: No rhonchi or crepitations. Cardiovascular: S1-S2 heard. Abdomen: Soft nontender bowel sound present. Musculoskeletal: No edema. Skin: No rash. Neurologic: Alert awake oriented time place and person.  Moving all extremities 5 x 5.  No facial asymmetry.  Mild rigidity. Psychiatric: Appears normal.  Normal affect.   Labs on Admission: I have personally reviewed following labs and imaging studies  CBC: Recent Labs  Lab 06/22/22 1619  WBC 8.0  HGB 11.4*  HCT 36.0*  MCV 92.8  PLT 191   Basic Metabolic Panel: Recent Labs  Lab 06/22/22 1619  NA 144  K 3.8  CL 108  CO2 28  GLUCOSE 104*  BUN 17  CREATININE 1.11  CALCIUM 8.6*   GFR: Estimated Creatinine Clearance: 54.6 mL/min (by C-G formula based on SCr of 1.11 mg/dL). Liver Function Tests: No results for input(s): "AST", "ALT", "ALKPHOS", "BILITOT", "PROT", "ALBUMIN" in the last 168 hours. No results for input(s): "LIPASE", "AMYLASE" in the last 168 hours. No results for input(s): "AMMONIA" in the last 168 hours. Coagulation Profile: No results for input(s): "INR", "PROTIME" in the last 168 hours. Cardiac Enzymes: No results for input(s): "CKTOTAL", "CKMB", "CKMBINDEX", "TROPONINI" in the last 168 hours. BNP (last 3 results) No results for input(s): "PROBNP" in the last 8760 hours. HbA1C: No results for input(s): "HGBA1C" in the last 72 hours. CBG: No results for input(s): "GLUCAP" in the last 168 hours. Lipid Profile: No results for input(s): "CHOL", "HDL", "LDLCALC", "TRIG", "CHOLHDL", "LDLDIRECT" in the last 72 hours. Thyroid Function Tests: No results for input(s): "TSH", "T4TOTAL", "FREET4", "T3FREE", "THYROIDAB" in the last 72 hours. Anemia Panel: No results for input(s): "VITAMINB12", "FOLATE", "FERRITIN", "TIBC", "IRON", "RETICCTPCT" in the last 72 hours. Urine analysis:    Component Value Date/Time   COLORURINE AMBER (A) 06/22/2022 1824   APPEARANCEUR CLEAR (A) 06/22/2022 1824    LABSPEC 1.015 06/22/2022 1824   PHURINE 6.0 06/22/2022 1824   GLUCOSEU NEGATIVE 06/22/2022 1824   HGBUR NEGATIVE 06/22/2022 1824   BILIRUBINUR NEGATIVE 06/22/2022 1824   KETONESUR 5 (A) 06/22/2022 1824   PROTEINUR NEGATIVE 06/22/2022 1824   NITRITE NEGATIVE 06/22/2022 1824   LEUKOCYTESUR NEGATIVE 06/22/2022 1824   Sepsis Labs: @LABRCNTIP (procalcitonin:4,lacticidven:4) )No results found for this or any previous visit (from the past 240 hour(s)).   Radiological Exams on Admission: CT HEAD WO CONTRAST (06/24/2022)  Result Date: 06/22/2022 CLINICAL DATA:  Neurological deficit. EXAM: CT HEAD WITHOUT CONTRAST TECHNIQUE: Contiguous axial images were obtained from the base of the skull through the vertex without intravenous contrast. RADIATION DOSE REDUCTION: This exam was performed according to the departmental dose-optimization program which includes automated exposure control, adjustment of the mA and/or kV according to patient size and/or use of iterative reconstruction technique. COMPARISON:  May 28, 2022 FINDINGS: Brain: There is  mild cerebral atrophy with widening of the extra-axial spaces and ventricular dilatation. There are areas of decreased attenuation within the white matter tracts of the supratentorial brain, consistent with microvascular disease changes. Vascular: No hyperdense vessel or unexpected calcification. Skull: Normal. Negative for fracture or focal lesion. Sinuses/Orbits: No acute finding. Other: None. IMPRESSION: 1. No acute intracranial abnormality. 2. Generalized cerebral atrophy with chronic white matter small vessel ischemic changes. Electronically Signed   By: Aram Candela M.D.   On: 06/22/2022 22:11   DG Chest 2 View  Result Date: 06/22/2022 CLINICAL DATA:  Dizziness.  Mid back pain.  History of [name]'s. EXAM: CHEST - 2 VIEW COMPARISON:  AP chest 07/15/2020 FINDINGS: Cardiac silhouette and mediastinal contours are within normal limits. Mild calcification within the  aortic arch. A coronary artery stent is again seen. The lungs are clear. No pleural effusion or pneumothorax. Mild-to-moderate multilevel degenerative disc changes of the thoracic spine. IMPRESSION: No active cardiopulmonary disease. Electronically Signed   By: Neita Garnet M.D.   On: 06/22/2022 17:01    EKG: Independently reviewed.  Normal sinus rhythm LBBB.  Assessment/Plan Principal Problem:   Ambulatory dysfunction Active Problems:   [name]'s disease (HCC)    Difficulty with ambulation with history of [name] disease and recent change in medications -CT head unremarkable appears nonfocal.  Will discuss with neurologist to get further input.  Physical therapy consult. Hypotension required at the assisted living facility.  Will check orthostatic changes.  Continue to monitor. History of paroxysmal atrial fibrillation presently in sinus rhythm. Normocytic normochromic anemia -we will check anemia panel.   DVT prophylaxis: Lovenox. Code Status: Full code. Family Communication: Discussed with patient. Disposition Plan: To be determined. Consults called: Will need to consult neurology. Admission status: Observation.   Eduard Clos MD [facility] Pager [phone].  If 7PM-7AM, please contact night-coverage [email or web address] Password [facility]  06/22/2022, 10:46 PM

## 2022-06-22 NOTE — ED Notes (Signed)
Received patient in no acute distress breathing spontaneously to room air . Patient denies complaints at this time .Continuing monitoring.  ?

## 2022-06-22 NOTE — ED Notes (Signed)
Urine output 1000 mLs

## 2022-06-22 NOTE — ED Provider Triage Note (Signed)
Emergency Medicine Provider Triage Evaluation Note  Corey Morrison, a 66 y.o. male  was evaluated in triage.  Pt complains of dizziness and shaking. He presents via EMS from [facility].  Staff reports the patient was dizzy this morning and when he got dizzy he began to shake.  Patient with a history of [name]'s disease denies any head injury or LOC.  He presents via EMS, and was found to be hypotensive at 91/60.  Patient with a history of A-fib not on blood thinners,CAD, hyperlipidemia.  Review of Systems  Positive: Dizziness, hypotension Negative: FCS, LOC  Physical Exam  BP 104/66 (BP Location: Right Arm)   Pulse 81   Temp 98.5 F (36.9 C) (Oral)   Resp 18   Ht 5\' 9"  (1.753 m)   Wt 59 kg   SpO2 100%   BMI 19.20 kg/m  Gen:   Awake, no distress  NAD Resp:  Normal effort CTA MSK:   Moves extremities without difficulty  Other:    Medical Decision Making  Medically screening exam initiated at 4:22 PM.  Appropriate orders placed.  Corey Morrison was informed that the remainder of the evaluation will be completed by another provider, this initial triage assessment does not replace that evaluation, and the importance of remaining in the ED until their evaluation is complete.  Patient to the ED for evaluation of dizziness and low blood pressures.  He presents via EMS from his facility.  Patient denies any falls, chest pain, or shortness of breath.   Randa Spike, PA-C 06/22/22 1627

## 2022-06-22 NOTE — ED Provider Notes (Signed)
[facility] Provider Note    Event Date/Time   First MD Initiated Contact with Patient 06/22/22 1744     (approximate)   History   Hypotension   HPI  Corey Morrison is a 66 y.o. male past medical history of coronary disease, [name]'s disease, hyperlipidemia and atrial fibrillation presents with low blood pressure and difficulty walking.  Patient tells me that the last 4 to 5 days he has had more difficulty walking feels unsteady like he is going to pass out legs feel heavy.  He also endorses feeling and numbness in his extremities and overall feeling like his extremities are hard to move denies visual change or headache.  Denies back pain urinary or bowel incontinence.  He sees Dr. Malvin Johns for his [name]'s his medications were adjusted recently.  Typically he walks with a walker but today was having difficulty doing this.  Apparently he also had low blood pressure at the facility and he did endorse feeling dizzy and denies fevers chills cough does feel somewhat short of breath.    Past Medical History:  Diagnosis Date   A-fib (HCC)    Anxiety disorder    Coronary artery disease    Hyperlipidemia    [name] disease (HCC)    Vitamin B12 deficiency     Patient Active Problem List   Diagnosis Date Noted   [name]'s disease (HCC) 06/11/2022     Physical Exam  Triage Vital Signs: ED Triage Vitals  Enc Vitals Group     BP 06/22/22 1616 104/66     Pulse Rate 06/22/22 1616 81     Resp 06/22/22 1616 18     Temp 06/22/22 1616 98.5 F (36.9 C)     Temp Source 06/22/22 1616 Oral     SpO2 06/22/22 1616 100 %     Weight 06/22/22 1616 130 lb (59 kg)     Height 06/22/22 1616 5\' 9"  (1.753 m)     Head Circumference --      Peak Flow --      Pain Score 06/22/22 1616 7     Pain Loc --      Pain Edu? --      Excl. in GC? --     Most recent vital signs: Vitals:   06/22/22 1900 06/22/22 2000  BP: (!) 169/91 (!) 172/97  Pulse: 99 94  Resp: 17 17   Temp:    SpO2: 96% 99%     General: Awake, no distress.  CV:  Good peripheral perfusion.  Resp:  Normal effort.  No increased work of breathing normal rate Abd:  No distention.  Neuro:             Awake, Alert, Oriented x 3  Other:  Patient has masked facies and somewhat slowed speech PERRLA, EOMI, face is symmetric normal tongue movement 4-5 strength in the bilateral upper and lower extremities Finger-nose-finger intact but slow 2+ patellar reflex bilaterally in the lower extremities Sensation grossly intact in bilateral lower extremities and upper extremities    ED Results / Procedures / Treatments  Labs (all labs ordered are listed, but only abnormal results are displayed) Labs Reviewed  BASIC METABOLIC PANEL - Abnormal; Notable for the following components:      Result Value   Glucose, Bld 104 (*)    Calcium 8.6 (*)    All other components within normal limits  CBC - Abnormal; Notable for the following components:   RBC 3.88 (*)  Hemoglobin 11.4 (*)    HCT 36.0 (*)    All other components within normal limits  URINALYSIS, ROUTINE W REFLEX MICROSCOPIC - Abnormal; Notable for the following components:   Color, Urine AMBER (*)    APPearance CLEAR (*)    Ketones, ur 5 (*)    All other components within normal limits  TROPONIN I (HIGH SENSITIVITY)     EKG  EKG reviewed interpreted by myself shows a left bundle branch block with left axis deviation no Sgarbossa criteria to suggest ischemia   RADIOLOGY I reviewed and interpreted the CXR which does not show any acute cardiopulmonary process    PROCEDURES:  Critical Care performed: No  Procedures  The patient is on the cardiac monitor to evaluate for evidence of arrhythmia and/or significant heart rate changes.   MEDICATIONS ORDERED IN ED: Medications  sodium chloride 0.9 % bolus 1,000 mL (0 mLs Intravenous Stopped 06/22/22 1923)     IMPRESSION / MDM / ASSESSMENT AND PLAN / ED COURSE  I reviewed the  triage vital signs and the nursing notes.                              Patient's presentation is most consistent with severe exacerbation of chronic illness.  Differential diagnosis includes, but is not limited to, medication side effect, worsening [name]'s disease, CVA, GBS,  Patient is a 66 year old male with underlying [name]'s disease presents today due to difficulty ambulating due to feeling unsteady and generally weak as well as low blood pressure.  He has had symptoms for about 4 to 5 days feels like he is unsteady and going to fall forward and cannot lift his legs up.  I see that he saw Dr. Malvin Johns a week ago and that his ropinirole was stopped and he was increased on the Sinemet, apparently due to hallucinations.  He denies urinary symptoms or pain no fevers chills cough chest pain but does feel mildly dyspneic.  Patient's vitals are reassuring he is not hypotensive here.  On exam he does appear parkinsonian with masked facies speech is slowed does have diminished strength but it is symmetric throughout his sensation is tach and he has good reflexes in the lower extremities.  Exam is not consistent with CVA cord compression or acute peripheral neuropathy such as Guillain-Barr syndrome.  My suspicion is that this is worsening of his underlying [name]'s disease secondary to medication changes.  We will want to rule out any acute illness which could make his parkinsonian symptoms worse as well including UTI and chest x-ray.  If patient unable to ambulate will need to admit.  Patient's medical work-up is reassuring UA is negative chest x-ray negative.  Attempted to ambulate the patient he was very unsteady and rigid he does not feel comfortable going home.  Will discuss with the hospitalist for admission.       FINAL CLINICAL IMPRESSION(S) / ED DIAGNOSES   Final diagnoses:  Weakness     Rx / DC Orders   ED Discharge Orders     None        Note:  This document was  prepared using Dragon voice recognition software and may include unintentional dictation errors.   Georga Hacking, MD 06/22/22 2022

## 2022-06-23 DIAGNOSIS — I959 Hypotension, unspecified: Secondary | ICD-10-CM | POA: Diagnosis not present

## 2022-06-23 DIAGNOSIS — R262 Difficulty in walking, not elsewhere classified: Secondary | ICD-10-CM | POA: Diagnosis not present

## 2022-06-23 DIAGNOSIS — D649 Anemia, unspecified: Secondary | ICD-10-CM | POA: Diagnosis present

## 2022-06-23 DIAGNOSIS — I951 Orthostatic hypotension: Secondary | ICD-10-CM | POA: Diagnosis not present

## 2022-06-23 DIAGNOSIS — I480 Paroxysmal atrial fibrillation: Secondary | ICD-10-CM | POA: Diagnosis present

## 2022-06-23 LAB — BASIC METABOLIC PANEL
Anion gap: 7 (ref 5–15)
BUN: 23 mg/dL (ref 8–23)
CO2: 27 mmol/L (ref 22–32)
Calcium: 8.3 mg/dL — ABNORMAL LOW (ref 8.9–10.3)
Chloride: 111 mmol/L (ref 98–111)
Creatinine, Ser: 0.83 mg/dL (ref 0.61–1.24)
GFR, Estimated: >60 mL/min (ref >60)
Glucose, Bld: 101 mg/dL — ABNORMAL HIGH (ref 70–99)
Potassium: 4 mmol/L (ref 3.5–5.1)
Sodium: 145 mmol/L (ref 135–145)

## 2022-06-23 LAB — VITAMIN B12: Vitamin B-12: 661 pg/mL (ref 180–914)

## 2022-06-23 LAB — CBC
HCT: 31.5 % — ABNORMAL LOW (ref 39.0–52.0)
Hemoglobin: 10.1 g/dL — ABNORMAL LOW (ref 13.0–17.0)
MCH: 29.2 pg (ref 26.0–34.0)
MCHC: 32.1 g/dL (ref 30.0–36.0)
MCV: 91 fL (ref 80.0–100.0)
Platelets: 165 10*3/uL (ref 150–400)
RBC: 3.46 MIL/uL — ABNORMAL LOW (ref 4.22–5.81)
RDW: 13.7 % (ref 11.5–15.5)
WBC: 8.7 10*3/uL (ref 4.0–10.5)
nRBC: 0 % (ref 0.0–0.2)

## 2022-06-23 LAB — HIV ANTIBODY (ROUTINE TESTING W REFLEX): HIV Screen 4th Generation wRfx: NONREACTIVE

## 2022-06-23 MED ORDER — ACETAMINOPHEN 325 MG PO TABS
650.0000 mg | ORAL_TABLET | Freq: Once | ORAL | Status: AC
  Administered 2022-06-23: 650 mg via ORAL
  Filled 2022-06-23: qty 2

## 2022-06-23 MED ORDER — CARBIDOPA-LEVODOPA 25-100 MG PO TABS
1.0000 | ORAL_TABLET | Freq: Four times a day (QID) | ORAL | Status: DC
  Administered 2022-06-23 – 2022-06-29 (×23): 1 via ORAL
  Filled 2022-06-23 (×23): qty 1

## 2022-06-23 MED ORDER — ENTACAPONE 200 MG PO TABS
200.0000 mg | ORAL_TABLET | Freq: Every day | ORAL | Status: DC
Start: 2022-06-23 — End: 2022-06-29
  Administered 2022-06-23 – 2022-06-29 (×28): 200 mg via ORAL
  Filled 2022-06-23 (×30): qty 1

## 2022-06-23 MED ORDER — ROPINIROLE HCL 1 MG PO TABS
0.5000 mg | ORAL_TABLET | Freq: Three times a day (TID) | ORAL | Status: DC
  Administered 2022-06-23 – 2022-06-24 (×5): 0.5 mg via ORAL
  Filled 2022-06-23 (×5): qty 1

## 2022-06-23 MED ORDER — CARBIDOPA-LEVODOPA ER 25-100 MG PO TBCR
1.0000 | EXTENDED_RELEASE_TABLET | Freq: Every day | ORAL | Status: DC
  Administered 2022-06-23 – 2022-06-28 (×6): 1 via ORAL
  Filled 2022-06-23 (×6): qty 1

## 2022-06-23 NOTE — Consult Note (Signed)
Neurology Consultation  Reason for Consult: Gait disturbance, hypertension Referring Physician: Dr. Esaw Grandchild  CC: Gait disturbance  History is obtained from: Patient, chart  HPI: Bart Ashford is a 66 y.o. male past history of atrial fibrillation, anxiety, coronary artery disease, hyperlipidemia, [name]'s that has been diagnosed 15 years ago but symptoms probably have existed 8 years preceding, B12 deficiency, brought into the ER from assisted living facility due to worsening ability to ambulate. He reports that he had been having worsening hallucinations over the past few weeks, follows with Dr. Malvin Johns at [facility] clinic neurology, who had made some changes as below: - Sinemet instead of 1 tablet every 4 hours from 7 AM to 7 PM was changed to 6 times a day. - Requip was discontinued-was taking 2 mg 4 times a day. - Entacapone was continued to 200 mg 6 times a day - Celexa was continued Ever since making those changes, he has had more trouble walking and is unable to ambulate at his baseline.  At baseline, he is able to ambulate with somewhat better ability than he is now-he is shuffling with gait initiation failure and requiring constant assistance.  Also feels that his whole body is imbalanced and difficult to keep an equilibrium. He also has had dizziness and feels that his head feels heavy after making these changes because he feels that the entacapone gives him this and having the side effects. No recent infections.  Recent medication changes as above.  ROS: Full ROS was performed and is negative except as noted in the HPI.  Past Medical History:  Diagnosis Date   A-fib (HCC)    Anxiety disorder    Coronary artery disease    Hyperlipidemia    [name] disease (HCC)    Vitamin B12 deficiency     Family History  Problem Relation Age of Onset   Heart Problems Father      Social History:   reports that he has quit smoking. He has never used smokeless tobacco. He  reports that he does not drink alcohol and does not use drugs.  Medications  Current Facility-Administered Medications:    carbidopa-levodopa (SINEMET IR) 25-100 MG per tablet immediate release 1 tablet, 1 tablet, Oral, 6 X Daily, Eduard Clos, MD, 1 tablet at 06/23/22 0909   citalopram (CELEXA) tablet 40 mg, 40 mg, Oral, Daily, Eduard Clos, MD, 40 mg at 06/23/22 0909   enoxaparin (LOVENOX) injection 40 mg, 40 mg, Subcutaneous, Q24H, Eduard Clos, MD, 40 mg at 06/23/22 5852   entacapone (COMTAN) tablet 200 mg, 200 mg, Oral, 6 X Daily, Eduard Clos, MD, 200 mg at 06/23/22 7782   Exam: Current vital signs: BP 116/68 (BP Location: Left Arm)   Pulse 74   Temp 98.6 F (37 C)   Resp 16   Ht 5\' 9"  (1.753 m)   Wt 59 kg   SpO2 99%   BMI 19.20 kg/m  Vital signs in last 24 hours: Temp:  [98.1 F (36.7 C)-98.6 F (37 C)] 98.6 F (37 C) (07/27 0731) Pulse Rate:  [66-99] 74 (07/27 0731) Resp:  [14-20] 16 (07/27 0731) BP: (96-172)/(64-97) 116/68 (07/27 0731) SpO2:  [96 %-100 %] 99 % (07/27 0731) Weight:  [59 kg] 59 kg (07/26 1616) General: Awake alert in no distress HNT: Normocephalic atraumatic CVS: Regular rate rhythm Respiratory: Breathing well saturating normally on room air. Neurologic exam Awake alert oriented x3 No dysarthria but has monotonous speech. No aphasia Cranial nerves II to XII intact.Hypomimia  observed Motor examination with mild cogwheeling bilaterally which I felt was somewhat worse on the left than right.  No discernible resting tremor.  Bradykinesia noted. Sensory exam intact to light touch Coordination: No gross dysmetria.  Finger taps slow. Gait: Examined him walking with the therapist.  Used a walker to walk.  Definitely exhibited gait initiation failure.  Labs I have reviewed labs in epic and the results pertinent to this consultation are:  CBC    Component Value Date/Time   WBC 8.7 06/23/2022 0522   RBC 3.46 (L)  06/23/2022 0522   HGB 10.1 (L) 06/23/2022 0522   HCT 31.5 (L) 06/23/2022 0522   PLT 165 06/23/2022 0522   MCV 91.0 06/23/2022 0522   MCH 29.2 06/23/2022 0522   MCHC 32.1 06/23/2022 0522   RDW 13.7 06/23/2022 0522   LYMPHSABS 0.7 06/11/2022 1235   MONOABS 0.5 06/11/2022 1235   EOSABS 0.1 06/11/2022 1235   BASOSABS 0.0 06/11/2022 1235    CMP     Component Value Date/Time   NA 145 06/23/2022 0522   K 4.0 06/23/2022 0522   CL 111 06/23/2022 0522   CO2 27 06/23/2022 0522   GLUCOSE 101 (H) 06/23/2022 0522   BUN 23 06/23/2022 0522   CREATININE 0.83 06/23/2022 0522   CALCIUM 8.3 (L) 06/23/2022 0522   PROT 6.3 (L) 06/11/2022 1235   ALBUMIN 3.5 06/11/2022 1235   AST 11 (L) 06/11/2022 1235   ALT 12 06/11/2022 1235   ALKPHOS 57 06/11/2022 1235   BILITOT 0.6 06/11/2022 1235   GFRNONAA >60 06/23/2022 0522   GFRAA >60 07/15/2020 0951  Urinalysis unremarkable   Imaging I have reviewed the images obtained:  CT head: No acute changes.  Generalized atrophy  Chest x-ray with no evidence of active cardiopulmonary disease.  Assessment:  66 year old with [name]'s with recent changes made to his medications because of increasing hallucinations. Examination consistent with features of [name]'s and more specifically very prominent gait initiation failure. I suspect that the change in medications has not adjusted well with him and that is why he is having more trouble walking. His ropinirole was also abruptly stopped-which I feel should be tapered somewhat. I would recommend some medication changes and reassess him tomorrow.  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 - Sinemet 25/100 mg CR formulation at 9 PM - Start ropinirole at 0.5 3 times  daily - Entacapone 200 mg-currently 6 times a day, changed to match the Sinemet times.  Check orthostatic vitals Continue PT OT. We will follow.  Appreciate pharmacy assistance for ordering the Sinemet and ropinirole according to the above schedule.  Plan d/w Dr. Denton Lank -- Milon Dikes, MD Neurologist [facility] Pager: [phone]

## 2022-06-23 NOTE — Assessment & Plan Note (Signed)
Recently with worsening hallucinations as well as ambulatory dysfunction as outlined above. -- Plan as outlined, see ambulatory dysfunction --Appreciate neurology recommendations -- Neurology follow-up outpatient

## 2022-06-23 NOTE — Progress Notes (Signed)
       CROSS COVER NOTE  NAME: Corey Morrison MRN: [id number] DOB : 1955-12-29    Date of Service   06/23/22  HPI/Events of Note   Secure chat received from nursing "Patient is reporting 6/10 left leg pain. He says this is a chronic issue for him and he usually just takes tylenol and that helps. "  Interventions   Plan: Tylenol ordered          This document was prepared using Dragon voice recognition software and may include unintentional dictation errors.  Bishop Limbo DNP, MHA, FNP-BC Nurse Practitioner [facility] Pager [phone]

## 2022-06-23 NOTE — Assessment & Plan Note (Addendum)
Patient has gait initiation failure secondary to [name]'s disease.  Recent medication changes with outpatient neurology may contribute to recent worsening. -- Neurology consulted, appreciate recommendations -- Requip resumed, increased to 1 mg TID -- Sinemet 25/100 adjusted as follows: IR formulation at 7 AM, 10 AM, 1 PM, 5 PM, and transition to CR formulation at 9 PM --Entacapone reduced from 6>>5 times daily -- PT evaluation -HH recommended -- Check orthostatic vitals -- Close outpatient neurology follow up after d/c

## 2022-06-23 NOTE — Assessment & Plan Note (Addendum)
Reportedly ALF staff found patient's BP to be low prompting ER visit. Suspect due to [name]'s disease. -- Check orthostatic vitals -- Monitor BP closely -- Maintain MAP above 65 with fluids if needed  7/28 AM: Patient noted profound dizziness with mobility tech.  BP checked and was 79/53. -- 500 cc bolus NS and closely monitor

## 2022-06-23 NOTE — Hospital Course (Signed)
66 year old male with past medical history of [name]'s disease with dyskinesia and recently hallucinations, paroxysmal A-fib presented to the ED on 06/22/2022 from his ALF due to progressively worsening difficulty with ambulating and found to have low BP by ALF staff.  His [name]'s medications have recently been adjusted as outpatient due to worsening hallucinations.  Sinemet frequency was increased, Requip was stopped, entacapone was continued.  Patient's vitals were stable in the ED.  CT head was unremarkable.  EKG was normal sinus rhythm with LBBB.  Labs were mostly unremarkable with mild stable anemia.  Patient was admitted to the hospital for further evaluation management including consultation with neurology.

## 2022-06-23 NOTE — Evaluation (Signed)
Physical Therapy Evaluation Patient Details Name: Corey Morrison MRN: [id number] DOB: 11-Apr-1956 Today's Date: 06/23/2022  History of Present Illness  Per MD: Corey Morrison is a 66 y.o. male with history of [name]'s disease, paroxysmal atrial fibrillation was brought to the ER after patient was found to have low blood pressure at the assisted living facility.  Patient denies any dizziness loss of consciousness chest pain or palpitation.     Clinical Impression  Pt received in supine position and agreeable to therapy.  Pt noted to have good strength in his UE's during assessment, but was unable to initiate the movement for assessment of the LE's.  Pt required assistance for transfers/bed-mobility as noted below, and states he wanted to perform multiple attempts in order to get his legs in the right position and initiated for gait.  Pt unable to perform STS with RW, and instead utilized HHA from therapist and was able to perform with good technique.  Pt performed x5 before attempting to ambulate in the hall.  Pt has [name]'s and was demonstrating shuffling/festinating gait pattern during the initiation portion of the movement.  Once able to initiate gait, pt moves well.  Pt then ambulated to the end of the hallway before returning to the recliner where all needs met and call bell was within reach.  Neurologist in room for assessment during and at conclusion of session.  Current discharge plans to ALF with HHPT is appropriate at this time.  Pt will continue to benefit from skilled therapy in order to address deficits listed below.      Recommendations for follow up therapy are one component of a multi-disciplinary discharge planning process, led by the attending physician.  Recommendations may be updated based on patient status, additional functional criteria and insurance authorization.  Follow Up Recommendations Home health PT      Assistance Recommended at Discharge Intermittent  Supervision/Assistance  Patient can return home with the following  A little help with walking and/or transfers;A little help with bathing/dressing/bathroom    Equipment Recommendations    Recommendations for Other Services       Functional Status Assessment Patient has had a recent decline in their functional status and demonstrates the ability to make significant improvements in function in a reasonable and predictable amount of time.     Precautions / Restrictions Precautions Precautions: Fall Restrictions Weight Bearing Restrictions: No      Mobility  Bed Mobility Overal bed mobility: Needs Assistance Bed Mobility: Supine to Sit     Supine to sit: Mod assist, HOB elevated     General bed mobility comments: HHA from therapist to come upright and modA necessary for navigation of the LE's due to inability to initiate movement of his LE's.    Transfers Overall transfer level: Needs assistance Equipment used: 1 person hand held assist Transfers: Sit to/from Stand Sit to Stand: Mod assist           General transfer comment: Pt required 1 person HHA from therapist in order to come upright.  Pt notes needing to perform multiple times to get his LE's to work properly.    Ambulation/Gait Ambulation/Gait assistance: Min guard Gait Distance (Feet): 60 Feet Assistive device: Rolling walker (2 wheels) Gait Pattern/deviations: Step-through pattern, Steppage, Shuffle, Festinating Gait velocity: decreased     General Gait Details: Pt has [name]'s and was demonstrating shuffling/festinating gait pattern during the initiation portion of the movement.  Once moving, pt performed well.  Stairs  Wheelchair Mobility    Modified Rankin (Stroke Patients Only)       Balance Overall balance assessment: Needs assistance Sitting-balance support: Feet supported, No upper extremity supported Sitting balance-Leahy Scale: Good     Standing balance support:  Bilateral upper extremity supported, During functional activity, Reliant on assistive device for balance Standing balance-Leahy Scale: Poor                               Pertinent Vitals/Pain Pain Assessment Pain Assessment: No/denies pain    Home Living Family/patient expects to be discharged to:: Assisted living                 Home Equipment: Rollator (4 wheels);Cane - single point;Shower seat;Grab bars - toilet;Grab bars - tub/shower;Hand held shower head      Prior Function Prior Level of Function : Needs assist               ADLs Comments: Sometimes needing assistance with medications.     Hand Dominance   Dominant Hand: Right    Extremity/Trunk Assessment   Upper Extremity Assessment Upper Extremity Assessment: Generalized weakness    Lower Extremity Assessment Lower Extremity Assessment: Generalized weakness (Difficult to assess due to the inability to initiate movement of the LE's at first.)       Communication   Communication: No difficulties  Cognition Arousal/Alertness: Awake/alert Behavior During Therapy: WFL for tasks assessed/performed Overall Cognitive Status: Impaired/Different from baseline Area of Impairment: Orientation                 Orientation Level: Time             General Comments: Pt alert and oriented to person, place, and situation, but not to time (stated 2030 for the year).        General Comments      Exercises     Assessment/Plan    PT Assessment Patient needs continued PT services  PT Problem List Decreased strength;Decreased activity tolerance;Decreased balance;Decreased mobility;Decreased coordination       PT Treatment Interventions DME instruction;Gait training;Functional mobility training;Therapeutic activities;Therapeutic exercise;Balance training;Neuromuscular re-education    PT Goals (Current goals can be found in the Care Plan section)  Acute Rehab PT Goals Patient Stated  Goal: to get his medications under control and get back to ALF. PT Goal Formulation: With patient Time For Goal Achievement: 07/07/22 Potential to Achieve Goals: Good    Frequency Min 2X/week     Co-evaluation               AM-PAC PT "6 Clicks" Mobility  Outcome Measure Help needed turning from your back to your side while in a flat bed without using bedrails?: A Lot Help needed moving from lying on your back to sitting on the side of a flat bed without using bedrails?: A Lot Help needed moving to and from a bed to a chair (including a wheelchair)?: A Little Help needed standing up from a chair using your arms (e.g., wheelchair or bedside chair)?: A Little Help needed to walk in hospital room?: A Little Help needed climbing 3-5 steps with a railing? : A Little 6 Click Score: 16    End of Session Equipment Utilized During Treatment: Gait belt Activity Tolerance: Patient tolerated treatment well Patient left: in chair;with call bell/phone within reach;with chair alarm set (with neurologist MD in room at conclusion of session.) Nurse Communication: Mobility status PT Visit Diagnosis:  Unsteadiness on feet (R26.81);Other abnormalities of gait and mobility (R26.89);Muscle weakness (generalized) (M62.81);History of falling (Z91.81);Difficulty in walking, not elsewhere classified (R26.2);Other symptoms and signs involving the nervous system (R29.898)    Time: 7544-9201 PT Time Calculation (min) (ACUTE ONLY): 38 min   Charges:   PT Evaluation $PT Eval Moderate Complexity: 1 Mod PT Treatments $Gait Training: 8-22 mins        Gwenlyn Saran, PT, DPT 06/23/22, 1:50 PM

## 2022-06-23 NOTE — Progress Notes (Signed)
Progress Note   Patient: Corey Morrison WPY:[id number] DOB: [birth year] DOA: 06/22/2022     0 DOS: the patient was seen and examined on 06/23/2022   Brief hospital course: 66 year old male with past medical history of [name]'s disease with dyskinesia and recently hallucinations, paroxysmal A-fib presented to the ED on 06/22/2022 from his ALF due to progressively worsening difficulty with ambulating and found to have low BP by ALF staff.  His [name]'s medications have recently been adjusted as outpatient due to worsening hallucinations.  Sinemet frequency was increased, Requip was stopped, entacapone was continued.  Patient's vitals were stable in the ED.  CT head was unremarkable.  EKG was normal sinus rhythm with LBBB.  Labs were mostly unremarkable with mild stable anemia.  Patient was admitted to the hospital for further evaluation management including consultation with neurology.  Assessment and Plan: * Ambulatory dysfunction Patient has gait initiation failure secondary to [name]'s disease.  Recent medication changes with outpatient neurology may contribute to recent worsening. -- Neurology consulted -- Requip resumed -- Sinemet 25/100 adjusted as follows: IR formulation at 7 AM, 10 AM, 1 PM, 5 PM, and transition to CR formulation at 9 PM -- PT OT evaluations -- Check orthostatic vitals  [name]'s disease (HCC) Recently with worsening hallucinations as well as ambulatory dysfunction as outlined above. -- Plan as outlined, see ambulatory dysfunction --Appreciate neurology recommendations -- Neurology follow-up outpatient  Normocytic anemia Hemoglobin stable a on admission 11.4 compared to recent 11.2.  Anemia panel checked on admission and within normal limits. -- Monitor CBC  Paroxysmal atrial fibrillation (HCC) In sinus rhythm on admission. Heart rate is controlled. -- Resume telemetry monitoring if patient becomes tachycardic  Hypotension Reportedly ALF staff found  patient's BP to be low prompting ER visit. Suspect due to [name]'s disease. -- Check orthostatic vitals -- Monitor BP closely -- Maintain MAP above 65 with fluids if needed        Subjective: Patient awake resting in bed when seen on rounds today.  He reports currently medication is working well and he feels "in heaven.  When the medications wear off, his involuntary movements are significant.  He describes his recent hallucinations and states he is usually aware that they are hallucinations but sometimes are very real.  States he hopes to resume Requip  Physical Exam: Vitals:   06/23/22 0019 06/23/22 0539 06/23/22 0731 06/23/22 1554  BP: 127/66 96/64 116/68 129/75  Pulse: 92 67 74 79  Resp: 19 18 16 16   Temp: 98.1 F (36.7 C) 98.2 F (36.8 C) 98.6 F (37 C) 98.1 F (36.7 C)  TempSrc:      SpO2: 100% 98% 99% 100%  Weight:      Height:       General exam: awake, alert, no acute distress HEENT: atraumatic, clear conjunctiva, anicteric sclera, moist mucus membranes, hearing grossly normal  Respiratory system: CTAB, no wheezes, rales or rhonchi, normal respiratory effort. Cardiovascular system: normal S1/S2, RRR, no JVD, murmurs, rubs, gallops, no pedal edema.   Gastrointestinal system: soft, NT, ND, no HSM felt, +bowel sounds. Central nervous system: A&O x4.  Intermittent resting tremor in upper and lower extremities, no other gross focal neurologic deficits, normal speech Extremities: moves all, no edema, normal tone Skin: dry, intact, normal temperature Psychiatry: normal mood, congruent affect, judgement and insight appear normal   Data Reviewed:  Notable labs - anemia panel within normal limits,   glucose 101, Ca 8.3  Family Communication: none  Disposition: Status is: Observation The patient  remains OBS appropriate and will d/c before 2 midnights. Ongoing monitoring of medication adjustments   Planned Discharge Destination:  Return to ALF with [facility]    Time  spent: 40 minutes  Author: Pennie Banter, DO 06/23/2022 4:39 PM  For on call review [email or web address].

## 2022-06-23 NOTE — Assessment & Plan Note (Signed)
In sinus rhythm on admission. Heart rate is controlled. -- Resume telemetry monitoring if patient becomes tachycardic

## 2022-06-23 NOTE — Progress Notes (Signed)
Mobility Specialist - Progress Note   06/23/22 1432  Mobility  Activity Ambulated with assistance in hallway;Stood at bedside;Dangled on edge of bed  Level of Assistance Moderate assist, patient does 50-74%  Assistive Device Front wheel walker  Distance Ambulated (ft) 80 ft  Activity Response Tolerated well  $Mobility charge 1 Mobility    Pt semi supine upon arrival using RA. Completes bed mobility ModA for LE assist. STS with HHA and ambulates ModGuard-MinGuard --- shuffling during turns. Pt tolerates well and returns to bed with needs in reach and alarm set.  Clarisa Schools Mobility Specialist 06/23/22, 2:35 PM

## 2022-06-23 NOTE — Assessment & Plan Note (Signed)
Hemoglobin stable a on admission 11.4 compared to recent 11.2.  Anemia panel checked on admission and within normal limits. -- Monitor CBC

## 2022-06-24 DIAGNOSIS — I480 Paroxysmal atrial fibrillation: Secondary | ICD-10-CM | POA: Diagnosis present

## 2022-06-24 DIAGNOSIS — M79605 Pain in left leg: Secondary | ICD-10-CM | POA: Diagnosis present

## 2022-06-24 DIAGNOSIS — G20 Parkinson's disease: Secondary | ICD-10-CM | POA: Diagnosis present

## 2022-06-24 DIAGNOSIS — D649 Anemia, unspecified: Secondary | ICD-10-CM | POA: Diagnosis present

## 2022-06-24 DIAGNOSIS — I959 Hypotension, unspecified: Secondary | ICD-10-CM | POA: Diagnosis not present

## 2022-06-24 DIAGNOSIS — Z79899 Other long term (current) drug therapy: Secondary | ICD-10-CM | POA: Diagnosis not present

## 2022-06-24 DIAGNOSIS — I447 Left bundle-branch block, unspecified: Secondary | ICD-10-CM | POA: Diagnosis present

## 2022-06-24 DIAGNOSIS — R531 Weakness: Secondary | ICD-10-CM | POA: Diagnosis present

## 2022-06-24 DIAGNOSIS — I951 Orthostatic hypotension: Secondary | ICD-10-CM | POA: Diagnosis present

## 2022-06-24 DIAGNOSIS — R200 Anesthesia of skin: Secondary | ICD-10-CM | POA: Diagnosis present

## 2022-06-24 DIAGNOSIS — Z955 Presence of coronary angioplasty implant and graft: Secondary | ICD-10-CM | POA: Diagnosis not present

## 2022-06-24 DIAGNOSIS — E785 Hyperlipidemia, unspecified: Secondary | ICD-10-CM | POA: Diagnosis present

## 2022-06-24 DIAGNOSIS — F419 Anxiety disorder, unspecified: Secondary | ICD-10-CM | POA: Diagnosis present

## 2022-06-24 DIAGNOSIS — Z7982 Long term (current) use of aspirin: Secondary | ICD-10-CM | POA: Diagnosis not present

## 2022-06-24 DIAGNOSIS — R443 Hallucinations, unspecified: Secondary | ICD-10-CM | POA: Diagnosis present

## 2022-06-24 DIAGNOSIS — R42 Dizziness and giddiness: Secondary | ICD-10-CM | POA: Diagnosis present

## 2022-06-24 DIAGNOSIS — I2510 Atherosclerotic heart disease of native coronary artery without angina pectoris: Secondary | ICD-10-CM | POA: Diagnosis present

## 2022-06-24 DIAGNOSIS — I10 Essential (primary) hypertension: Secondary | ICD-10-CM | POA: Diagnosis present

## 2022-06-24 DIAGNOSIS — Z87891 Personal history of nicotine dependence: Secondary | ICD-10-CM | POA: Diagnosis not present

## 2022-06-24 DIAGNOSIS — G4700 Insomnia, unspecified: Secondary | ICD-10-CM | POA: Diagnosis present

## 2022-06-24 DIAGNOSIS — E538 Deficiency of other specified B group vitamins: Secondary | ICD-10-CM | POA: Diagnosis present

## 2022-06-24 DIAGNOSIS — R059 Cough, unspecified: Secondary | ICD-10-CM | POA: Diagnosis present

## 2022-06-24 DIAGNOSIS — R451 Restlessness and agitation: Secondary | ICD-10-CM | POA: Diagnosis present

## 2022-06-24 DIAGNOSIS — G249 Dystonia, unspecified: Secondary | ICD-10-CM | POA: Diagnosis present

## 2022-06-24 DIAGNOSIS — R262 Difficulty in walking, not elsewhere classified: Secondary | ICD-10-CM | POA: Diagnosis present

## 2022-06-24 MED ORDER — POLYETHYLENE GLYCOL 3350 17 G PO PACK
17.0000 g | PACK | Freq: Every day | ORAL | Status: DC
  Administered 2022-06-24 – 2022-06-29 (×6): 17 g via ORAL
  Filled 2022-06-24 (×6): qty 1

## 2022-06-24 MED ORDER — ACETAMINOPHEN 325 MG PO TABS
650.0000 mg | ORAL_TABLET | Freq: Four times a day (QID) | ORAL | Status: DC | PRN
  Administered 2022-06-24 – 2022-06-27 (×6): 650 mg via ORAL
  Filled 2022-06-24 (×6): qty 2

## 2022-06-24 MED ORDER — STERILE WATER FOR INJECTION IJ SOLN
INTRAMUSCULAR | Status: AC
  Filled 2022-06-24: qty 10

## 2022-06-24 MED ORDER — SODIUM CHLORIDE 0.9 % IV BOLUS
500.0000 mL | Freq: Once | INTRAVENOUS | Status: AC
  Administered 2022-06-24: 500 mL via INTRAVENOUS

## 2022-06-24 NOTE — Progress Notes (Signed)
Mobility Specialist - Progress Note   06/24/22 1200  Mobility  Activity Stood at bedside;Dangled on edge of bed  Level of Assistance Moderate assist, patient does 50-74%  Assistive Device Front wheel walker (HHA)  Distance Ambulated (ft) 0 ft  Activity Response RN notified  $Mobility charge 1 Mobility     Pt sitting semi supine upon arrival using RA. Completes bed mobility ModA with assist with LE. Completes STS with ModA HHA but descends safely back to EOB. Denies pain or lightheadedness  but voices feeling weaker this date. Pt needs heavier posterior assist to sit EOB. Author assesses patients motor functions and pt completes BLE therex with vc's. Pt completes second STS but requests to sit due to lightheadness. Upon return, pt begins UE sway, increase delay in processing and increased weakness. RN immediately notified and enters room with DO arriving shortly after. Pt alert and oriented the entire time, able to state full name and current location. Pt is left with RN staff present, DO aware and vitals taken. Pt denies pain throughout.  Clarisa Schools Mobility Specialist 06/24/22, 12:09 PM

## 2022-06-24 NOTE — Progress Notes (Signed)
PT Cancellation Note  Patient Details Name: Corey Morrison MRN: [id number] DOB: 08/28/1956   Cancelled Treatment:    Reason Eval/Treat Not Completed: Medical issues which prohibited therapy. Spoke with the nurse. Blood pressure issues prohibit safe mobility at this time, getting a fluid bolus. PT will continue with attempts as appropriate.   Donna Bernard, PT, MPT  Ina Homes 06/24/2022, 10:50 AM

## 2022-06-24 NOTE — Progress Notes (Signed)
   06/24/22 1025  Assess: MEWS Score  Temp 97.9 F (36.6 C)  BP (!) 79/53  MAP (mmHg) (!) 62  Resp 17  SpO2 100 %  O2 Device Room Air  Assess: MEWS Score  MEWS Temp 0  MEWS Systolic 2  MEWS Pulse 0  MEWS RR 0  MEWS LOC 0  MEWS Score 2  MEWS Score Color Yellow  Assess: if the MEWS score is Yellow or Red  Were vital signs taken at a resting state? Yes  Focused Assessment Change from prior assessment (see assessment flowsheet)  Does the patient meet 2 or more of the SIRS criteria? No  MEWS guidelines implemented *See Row Information* Yes  Treat  MEWS Interventions Other (Comment) (gave fluid bolus)  Pain Scale 0-10  Pain Score 0  Take Vital Signs  Increase Vital Sign Frequency  Yellow: Q 2hr X 2 then Q 4hr X 2, if remains yellow, continue Q 4hrs  Escalate  MEWS: Escalate Yellow: discuss with charge nurse/RN and consider discussing with provider and RRT  Notify: Charge Nurse/RN  Name of Charge Nurse/RN Notified Eastin Swing  Date Charge Nurse/RN Notified 06/24/22  Notify: Provider  Provider Name/Title Dr Denton Lank  Date Provider Notified 06/24/22  Time Provider Notified 1016  Method of Notification Face-to-face  Notification Reason Change in status;Other (Comment)  Provider response At bedside  Date of Provider Response 06/24/22  Time of Provider Response 1017  Document  Patient Outcome Other (Comment) (gave fluid bolus MD contact neurology to stop by and see patient)  Assess: SIRS CRITERIA  SIRS Temperature  0  SIRS Pulse 0  SIRS Respirations  0  SIRS WBC 1  SIRS Score Sum  1

## 2022-06-24 NOTE — TOC Initial Note (Signed)
Transition of Care [facility]) - Initial/Assessment Note    Patient Details  Name: Corey Morrison MRN: [id number] Date of Birth: 01-20-1956  [facility] CM/SW Contact:    Caryn Section, RN Phone Number: 06/24/2022, 4:08 PM  Clinical Narrative:  Patient is from [facility].  RNCM spoke with Velna Hatchet at facility, who states that he is eligible to return, however he will most likely need an assessment prior to returning.  Velna Hatchet  at facility also asks that medications are reviewed with facility prior to discharge.     Please contact POA on discharge as well as friend     Neldon Mc     [phone]  Who will arrange for transportation        Expected Discharge Plan:  (TBD) Barriers to Discharge: Continued Medical Work up   Patient Goals and CMS Choice        Expected Discharge Plan and Services Expected Discharge Plan:  (TBD)   Discharge Planning Services: CM Consult   Living arrangements for the past 2 months: Assisted Living Facility                                      Prior Living Arrangements/Services Living arrangements for the past 2 months: Assisted Living Facility Lives with:: Facility Resident Patient language and need for interpreter reviewed:: Yes Do you feel safe going back to the place where you live?: Yes      Need for Family Participation in Patient Care: Yes (Comment) Care giver support system in place?: Yes (comment)   Criminal Activity/Legal Involvement Pertinent to Current Situation/Hospitalization: No - Comment as needed  Activities of Daily Living Home Assistive Devices/Equipment: Cane (specify quad or straight), Walker (specify type) ADL Screening (condition at time of admission) Patient's cognitive ability adequate to safely complete daily activities?: Yes Is the patient deaf or have difficulty hearing?: No Does the patient have difficulty seeing, even when wearing glasses/contacts?: No Does the patient have  difficulty concentrating, remembering, or making decisions?: No Patient able to express need for assistance with ADLs?: Yes Does the patient have difficulty dressing or bathing?: No Independently performs ADLs?: Yes (appropriate for developmental age) Does the patient have difficulty walking or climbing stairs?: Yes Weakness of Legs: Both Weakness of Arms/Hands: None  Permission Sought/Granted Permission sought to share information with : Case Manager, Magazine features editor Permission granted to share information with : Yes, Verbal Permission Granted     Permission granted to share info w AGENCY: [facility]        Emotional Assessment Appearance:: Appears stated age Attitude/Demeanor/Rapport: Gracious Affect (typically observed): Pleasant   Alcohol / Substance Use: Not Applicable Psych Involvement: No (comment)  Admission diagnosis:  Weakness [R53.1] Ambulatory dysfunction [R26.2] Patient Active Problem List   Diagnosis Date Noted   Hypotension 06/23/2022   Paroxysmal atrial fibrillation (HCC) 06/23/2022   Normocytic anemia 06/23/2022   Ambulatory dysfunction 06/22/2022   [name]'s disease (HCC) 06/11/2022   PCP:  [facility]:   [facility] [place], [place] - [address] [place] [address] Phone: [phone] Fax: [phone]     Social Determinants of Health (SDOH) Interventions    Readmission Risk Interventions     No data to display

## 2022-06-24 NOTE — Progress Notes (Signed)
Neurology Progress Note   S:// Seen and examined Reports difficulty with moving legs and feet. Does not feel much difference with change in medications yesterday   O:// Current vital signs: BP 132/82 (BP Location: Right Arm)   Pulse 70   Temp 98.4 F (36.9 C)   Resp 18   Ht 5\' 9"  (1.753 m)   Wt 59 kg   SpO2 99%   BMI 19.20 kg/m  Vital signs in last 24 hours: Temp:  [97.5 F (36.4 C)-98.4 F (36.9 C)] 98.4 F (36.9 C) (07/28 0819) Pulse Rate:  [70-84] 70 (07/28 0819) Resp:  [16-18] 18 (07/28 0819) BP: (129-161)/(75-88) 132/82 (07/28 0819) SpO2:  [99 %-100 %] 99 % (07/28 0819)  General: Awake alert in no distress HEENT: Normocephalic, atraumatic CVs: Regular rate rhythm Respiratory: Breathing well saturating normally on room air Abdomen nondistended nontender Neurological exam Awake alert oriented x3, monotonous speech with no dysarthria, no aphasia, cranial nerve examination unremarkable except for hypomimia, motor examination with mild cogwheeling, no discernible resting tremor.  Bradykinesia noted.  Sensation intact to light touch.  No dysmetria but slow finger taps.  Gait testing was deferred at this time-we will be getting PT evaluation soon.  Medications  Current Facility-Administered Medications:    sterile water (preservative free) injection, , , ,    carbidopa-levodopa (SINEMET IR) 25-100 MG per tablet immediate release 1 tablet, 1 tablet, Oral, QID, 09[birth year]1978, MD, 1 tablet at 06/24/22 06/26/22   Carbidopa-Levodopa ER (SINEMET CR) 25-100 MG tablet controlled release 1 tablet, 1 tablet, Oral, QHS, 7096, MD, 1 tablet at 06/23/22 2053   citalopram (CELEXA) tablet 40 mg, 40 mg, Oral, Daily, 2054, MD, 40 mg at 06/24/22 0813   enoxaparin (LOVENOX) injection 40 mg, 40 mg, Subcutaneous, Q24H, 06/26/22, MD, 40 mg at 06/24/22 0813   entacapone (COMTAN) tablet 200 mg, 200 mg, Oral, 5 X Daily, 06/26/22, MD, 200 mg at 06/24/22 06/26/22    polyethylene glycol (MIRALAX / GLYCOLAX) packet 17 g, 17 g, Oral, Daily, Izacc, Demeyer A, DO, 17 g at 06/24/22 0818   rOPINIRole (REQUIP) tablet 0.5 mg, 0.5 mg, Oral, TID, 06/26/22, MD, 0.5 mg at 06/24/22 0813  Labs CBC    Component Value Date/Time   WBC 8.7 06/23/2022 0522   RBC 3.46 (L) 06/23/2022 0522   HGB 10.1 (L) 06/23/2022 0522   HCT 31.5 (L) 06/23/2022 0522   PLT 165 06/23/2022 0522   MCV 91.0 06/23/2022 0522   MCH 29.2 06/23/2022 0522   MCHC 32.1 06/23/2022 0522   RDW 13.7 06/23/2022 0522   LYMPHSABS 0.7 06/11/2022 1235   MONOABS 0.5 06/11/2022 1235   EOSABS 0.1 06/11/2022 1235   BASOSABS 0.0 06/11/2022 1235    CMP     Component Value Date/Time   NA 145 06/23/2022 0522   K 4.0 06/23/2022 0522   CL 111 06/23/2022 0522   CO2 27 06/23/2022 0522   GLUCOSE 101 (H) 06/23/2022 0522   BUN 23 06/23/2022 0522   CREATININE 0.83 06/23/2022 0522   CALCIUM 8.3 (L) 06/23/2022 0522   PROT 6.3 (L) 06/11/2022 1235   ALBUMIN 3.5 06/11/2022 1235   AST 11 (L) 06/11/2022 1235   ALT 12 06/11/2022 1235   ALKPHOS 57 06/11/2022 1235   BILITOT 0.6 06/11/2022 1235   GFRNONAA >60 06/23/2022 0522   GFRAA >60 07/15/2020 0951    Imaging I have reviewed images in epic and the results pertinent to this consultation are: No new  imaging Assessment: 66 year old with [name]'s disease with recent changes made to his medications because of increasing hallucinations.  Hallucinations are better for now. Yesterday's exam consistent with features of [name]'s with a very prominent gait initiation failure. I made some adjustments to his medications by changing the timing and frequency of his Sinemet from 6 times a day to 5 times a day with the last dose being continuous release formulation.  I have also resumed the ropinirole at a much lower dose of 0.5 mg 3 times a day as it was abruptly stopped.  Entacapone also reduced from 6 times a day to 5 times a day.  I would expect changes to be  subtle and slow and do not expect drastic changes and I would also be hesitant to make any drastic changes to his medications.   Recommendations: Continue with current regimen of Sinemet, entacapone and ropinirole. Continue PT OT assessments. If he feels that he is near his baseline, he can be discharged but if he subjectively does not feel back to his baseline yet, I would not hesitate to keep him another day and reexamine him tomorrow to let the medication changes take proper effect. Discussed with the patient in detail. Plan relayed to Dr. Denton Lank.   -- Milon Dikes, MD Neurologist [facility] Pager: [phone]

## 2022-06-24 NOTE — Progress Notes (Signed)
Progress Note   Patient: Corey Morrison ZOX:[id number] DOB: [birth year] DOA: 06/22/2022     0 DOS: the patient was seen and examined on 06/24/2022   Brief hospital course: 66 year old male with past medical history of [name]'s disease with dyskinesia and recently hallucinations, paroxysmal A-fib presented to the ED on 06/22/2022 from his ALF due to progressively worsening difficulty with ambulating and found to have low BP by ALF staff.  His [name]'s medications have recently been adjusted as outpatient due to worsening hallucinations.  Sinemet frequency was increased, Requip was stopped, entacapone was continued.  Patient's vitals were stable in the ED.  CT head was unremarkable.  EKG was normal sinus rhythm with LBBB.  Labs were mostly unremarkable with mild stable anemia.  Patient was admitted to the hospital for further evaluation management including consultation with neurology.  Assessment and Plan: * Ambulatory dysfunction Patient has gait initiation failure secondary to [name]'s disease.  Recent medication changes with outpatient neurology may contribute to recent worsening. -- Neurology consulted -- Requip resumed -- Sinemet 25/100 adjusted as follows: IR formulation at 7 AM, 10 AM, 1 PM, 5 PM, and transition to CR formulation at 9 PM --Entacapone reduced from 6>>5 times daily -- PT evaluation -HH recommended -- Check orthostatic vitals  Hypotension Reportedly ALF staff found patient's BP to be low prompting ER visit. Suspect due to [name]'s disease. -- Check orthostatic vitals -- Monitor BP closely -- Maintain MAP above 65 with fluids if needed  7/28 AM: Patient noted profound dizziness with mobility tech.  BP checked and was 79/53. -- 500 cc bolus NS and closely monitor  [name]'s disease (HCC) Recently with worsening hallucinations as well as ambulatory dysfunction as outlined above. -- Plan as outlined, see ambulatory dysfunction --Appreciate neurology  recommendations -- Neurology follow-up outpatient  Normocytic anemia Hemoglobin stable a on admission 11.4 compared to recent 11.2.  Anemia panel checked on admission and within normal limits. -- Monitor CBC  Paroxysmal atrial fibrillation (HCC) In sinus rhythm on admission. Heart rate is controlled. -- Resume telemetry monitoring if patient becomes tachycardic        Subjective: Patient seen at edge of bed working with mobility tech during my encounter this morning.  He is not functioning as well this morning as he did yesterday per mobility tech.  He reports profound dizziness and not moving nearly as well.  He is having a lot of involuntary movement in the head upper extremities and legs this morning.  He had recovered from dizziness by the time I entered the room and was saying that he felt he could walk well and was motivated to continue.  We laid him down to check blood pressure was found 79/53.  Physical Exam: Vitals:   06/24/22 1106 06/24/22 1108 06/24/22 1215 06/24/22 1621  BP: 103/61 (!) 103/58 (!) 99/59 125/67  Pulse: 76 75 74 71  Resp:      Temp:   98.9 F (37.2 C) 98.8 F (37.1 C)  TempSrc:      SpO2:   100% 100%  Weight:      Height:       General exam: awake, alert, no acute distress HEENT: Involuntary head movements noted moist mucous membranes, hearing grossly normal, Respiratory system: CTAB, no wheezes, rales or rhonchi, normal respiratory effort. Cardiovascular system: normal S1/S2, RRR, no JVD, murmurs, rubs, gallops, no pedal edema.   Central nervous system: A&O x4.  Involuntary resting tremor and myoclonic movements of head/neck, swaying in seated position, normal speech, follows  commands Extremities: moves all, no edema, normal tone Skin: dry, intact, normal temperature Psychiatry: normal mood, congruent affect, judgement and insight appear normal   Data Reviewed: No new labs today  Family Communication: none  Disposition: Status is:  Inpatient Remains inpatient appropriate because: Ongoing hypotensive episodes requiring IV fluids and close monitoring.  Requires further monitoring of changes to [name]'s medications.     Planned Discharge Destination:  Return to ALF with [facility]    Time spent: 45 minutes  Author: Pennie Banter, DO 06/24/2022 4:55 PM  For on call review [email or web address].

## 2022-06-25 DIAGNOSIS — R262 Difficulty in walking, not elsewhere classified: Secondary | ICD-10-CM | POA: Diagnosis not present

## 2022-06-25 DIAGNOSIS — R058 Other specified cough: Secondary | ICD-10-CM | POA: Diagnosis present

## 2022-06-25 DIAGNOSIS — G4700 Insomnia, unspecified: Secondary | ICD-10-CM | POA: Diagnosis present

## 2022-06-25 MED ORDER — PANTOPRAZOLE SODIUM 40 MG PO TBEC
40.0000 mg | DELAYED_RELEASE_TABLET | Freq: Every evening | ORAL | Status: DC
  Administered 2022-06-25 – 2022-06-28 (×4): 40 mg via ORAL
  Filled 2022-06-25 (×4): qty 1

## 2022-06-25 MED ORDER — ROPINIROLE HCL 1 MG PO TABS
1.0000 mg | ORAL_TABLET | Freq: Three times a day (TID) | ORAL | Status: DC
  Administered 2022-06-25 – 2022-06-29 (×13): 1 mg via ORAL
  Filled 2022-06-25 (×13): qty 1

## 2022-06-25 MED ORDER — MIDODRINE HCL 5 MG PO TABS
5.0000 mg | ORAL_TABLET | Freq: Two times a day (BID) | ORAL | Status: DC
Start: 2022-06-25 — End: 2022-06-28
  Administered 2022-06-25 – 2022-06-28 (×6): 5 mg via ORAL
  Filled 2022-06-25 (×6): qty 1

## 2022-06-25 MED ORDER — MELATONIN 5 MG PO TABS
5.0000 mg | ORAL_TABLET | Freq: Every day | ORAL | Status: DC
  Administered 2022-06-25 – 2022-06-28 (×4): 5 mg via ORAL
  Filled 2022-06-25 (×4): qty 1

## 2022-06-25 NOTE — Progress Notes (Signed)
Physical Therapy Treatment Patient Details Name: Corey Morrison MRN: [id number] DOB: [birth year] Today's Date: 06/25/2022   History of Present Illness Per MD: Corey Morrison is a 66 y.o. male with history of [name]'s disease, paroxysmal atrial fibrillation was brought to the ER after patient was found to have low blood pressure at the assisted living facility.  Patient denies any dizziness loss of consciousness chest pain or palpitation.    PT Comments    Pt seen prior to lunch. Demonstrated sit to stand with [employer] and RW.  Gait training with RW, Min Guard, 241ft with repeated vc's to maintain focus, stay within RW, and reduce shuffling. Pt demonstrated ability to stop and self correct shuffling to prevent fall. No LOB noted. Pt able to complete 90* turn to sit in recliner without LOB. Pt asked to stand for placement of linen in chair. Once standing, pt c/o of feeling like he was going to pass out, he was assisted to sitting, LE's raised, and symptoms resolved. Pt states this happens when he stands too quickly. Overall, great improvement from previous sessions. Will continue to progress until medically cleared for d/c back to ALF with HHPT.    Recommendations for follow up therapy are one component of a multi-disciplinary discharge planning process, led by the attending physician.  Recommendations may be updated based on patient status, additional functional criteria and insurance authorization.  Follow Up Recommendations  Home health PT     Assistance Recommended at Discharge Intermittent Supervision/Assistance  Patient can return home with the following A little help with walking and/or transfers;A little help with bathing/dressing/bathroom   Equipment Recommendations  None recommended by PT    Recommendations for Other Services       Precautions / Restrictions Precautions Precautions: Fall Restrictions Weight Bearing Restrictions: No     Mobility  Bed Mobility                General bed mobility comments: Pt in chair pre and post session    Transfers Overall transfer level: Needs assistance Equipment used: Rolling walker (2 wheels) Transfers: Sit to/from Stand Sit to Stand: Min guard           General transfer comment: improved    Ambulation/Gait Ambulation/Gait assistance: Min guard Gait Distance (Feet): 200 Feet Assistive device: Rolling walker (2 wheels) Gait Pattern/deviations: Step-through pattern, Steppage, Shuffle, Festinating       General Gait Details: Pt able to recognize when "shuffling" begins during gait and is able to stop and regroup   Stairs             Wheelchair Mobility    Modified Rankin (Stroke Patients Only)       Balance                                            Cognition Arousal/Alertness: Awake/alert Behavior During Therapy: WFL for tasks assessed/performed Overall Cognitive Status: Within Functional Limits for tasks assessed                                 General Comments: Pt appears much more alert and oriented this session        Exercises      General Comments General comments (skin integrity, edema, etc.): Pt educated on pacing himself, discussed home set up and addressed dischsrge  concerns/questions.      Pertinent Vitals/Pain Pain Assessment Pain Assessment: No/denies pain    Home Living                          Prior Function            PT Goals (current goals can now be found in the care plan section) Acute Rehab PT Goals Patient Stated Goal: to get his medications under control and get back to ALF. Progress towards PT goals: Progressing toward goals    Frequency    Min 2X/week      PT Plan      Co-evaluation              AM-PAC PT "6 Clicks" Mobility   Outcome Measure  Help needed turning from your back to your side while in a flat bed without using bedrails?: A Little Help needed moving from lying on  your back to sitting on the side of a flat bed without using bedrails?: A Little Help needed moving to and from a bed to a chair (including a wheelchair)?: A Little Help needed standing up from a chair using your arms (e.g., wheelchair or bedside chair)?: A Little Help needed to walk in hospital room?: A Little Help needed climbing 3-5 steps with a railing? : A Little 6 Click Score: 18    End of Session Equipment Utilized During Treatment: Gait belt Activity Tolerance: Patient tolerated treatment well Patient left: in chair;with call bell/phone within reach;with chair alarm set Nurse Communication: Mobility status PT Visit Diagnosis: Unsteadiness on feet (R26.81);Other abnormalities of gait and mobility (R26.89);Muscle weakness (generalized) (M62.81);History of falling (Z91.81);Difficulty in walking, not elsewhere classified (R26.2);Other symptoms and signs involving the nervous system (R29.898)     Time: 8937-3428 PT Time Calculation (min) (ACUTE ONLY): 15 min  Charges:  $Gait Training: 8-22 mins                    Corey Morrison, PTA    Corey Morrison 06/25/2022, 12:29 PM

## 2022-06-25 NOTE — Progress Notes (Signed)
Neurology Progress Note   S:// Seen and examined Somewhat better in terms of walking and hallucinations. Course complicated by labile BP   O:// Current vital signs: BP (!) 100/57 (BP Location: Right Arm)   Pulse 67   Temp 98.1 F (36.7 C)   Resp 18   Ht 5\' 9"  (1.753 m)   Wt 59 kg   SpO2 97%   BMI 19.20 kg/m  Vital signs in last 24 hours: Temp:  [97.9 F (36.6 C)-98.9 F (37.2 C)] 98.1 F (36.7 C) (07/29 0725) Pulse Rate:  [62-76] 67 (07/29 0725) Resp:  [16-18] 18 (07/29 0725) BP: (79-145)/(53-81) 100/57 (07/29 0725) SpO2:  [97 %-100 %] 97 % (07/29 0725)  General: Awake alert in no distress HEENT: Normocephalic, atraumatic CVs: Regular rate rhythm Respiratory: Breathing well saturating normally on room air Abdomen nondistended nontender Neurological exam Awake alert oriented x3, monotonous speech with no dysarthria, no aphasia, cranial nerve examination unremarkable except for hypomimia, motor examination with mild cogwheeling, no discernible resting tremor.  Bradykinesia noted.  Sensation intact to light touch.  No dysmetria but slow finger taps.  Gait testing was deferred at this time  Medications  Current Facility-Administered Medications:    acetaminophen (TYLENOL) tablet 650 mg, 650 mg, Oral, Q6H PRN, Keo, Schirmer A, DO, 650 mg at 06/25/22 0543   carbidopa-levodopa (SINEMET IR) 25-100 MG per tablet immediate release 1 tablet, 1 tablet, Oral, QID, 06/27/22, MD, 1 tablet at 06/25/22 0543   Carbidopa-Levodopa ER (SINEMET CR) 25-100 MG tablet controlled release 1 tablet, 1 tablet, Oral, QHS, 06/27/22, MD, 1 tablet at 06/24/22 2046   citalopram (CELEXA) tablet 40 mg, 40 mg, Oral, Daily, 2047, MD, 40 mg at 06/24/22 0813   enoxaparin (LOVENOX) injection 40 mg, 40 mg, Subcutaneous, Q24H, 06/26/22, MD, 40 mg at 06/24/22 0813   entacapone (COMTAN) tablet 200 mg, 200 mg, Oral, 5 X Daily, 06/26/22, MD, 200 mg at 06/25/22 0544    polyethylene glycol (MIRALAX / GLYCOLAX) packet 17 g, 17 g, Oral, Daily, Zymere, Patlan A, DO, 17 g at 06/24/22 0818   rOPINIRole (REQUIP) tablet 0.5 mg, 0.5 mg, Oral, TID, 06/26/22, MD, 0.5 mg at 06/24/22 2045  Labs CBC    Component Value Date/Time   WBC 8.7 06/23/2022 0522   RBC 3.46 (L) 06/23/2022 0522   HGB 10.1 (L) 06/23/2022 0522   HCT 31.5 (L) 06/23/2022 0522   PLT 165 06/23/2022 0522   MCV 91.0 06/23/2022 0522   MCH 29.2 06/23/2022 0522   MCHC 32.1 06/23/2022 0522   RDW 13.7 06/23/2022 0522   LYMPHSABS 0.7 06/11/2022 1235   MONOABS 0.5 06/11/2022 1235   EOSABS 0.1 06/11/2022 1235   BASOSABS 0.0 06/11/2022 1235    CMP     Component Value Date/Time   NA 145 06/23/2022 0522   K 4.0 06/23/2022 0522   CL 111 06/23/2022 0522   CO2 27 06/23/2022 0522   GLUCOSE 101 (H) 06/23/2022 0522   BUN 23 06/23/2022 0522   CREATININE 0.83 06/23/2022 0522   CALCIUM 8.3 (L) 06/23/2022 0522   PROT 6.3 (L) 06/11/2022 1235   ALBUMIN 3.5 06/11/2022 1235   AST 11 (L) 06/11/2022 1235   ALT 12 06/11/2022 1235   ALKPHOS 57 06/11/2022 1235   BILITOT 0.6 06/11/2022 1235   GFRNONAA >60 06/23/2022 0522   GFRAA >60 07/15/2020 0951    Imaging I have reviewed images in epic and the results pertinent to this consultation are: No  new imaging  Assessment: 66 year old with [name]'s disease with recent changes made to his medications because of increasing hallucinations.  Hallucinations are better for now. Exam on presentation consistent with features of [name]'s with a very prominent gait initiation failure. I made some adjustments to his medications by changing the timing and frequency of his Sinemet from 6 times a day to 5 times a day with the last dose being continuous release formulation.  I also resumed the ropinirole yesterday at a much lower dose of 0.5 mg 3 times a day as it was abruptly stopped.   Entacapone also reduced from 6 times a day to 5 times a day.   Feels better but  BP has been labile  Will up ropinirole and have therapy reassess.  Recommendations: Continue with current regimen of Sinemet, entacapone Increase ropinirole to 1mg  TID. Continue PT OT assessments. Based on PTOT assessments, if he appears stable, can be discahrged with close outpatinet follow up with his neurologist.  -- , MD Neurologist [facility] Pager: [phone]

## 2022-06-25 NOTE — Assessment & Plan Note (Signed)
Pt reports chronic and requests a sleep aide. --Trial melatonin

## 2022-06-25 NOTE — Progress Notes (Addendum)
Progress Note   Patient: Ji Feldner UKG:[id number] DOB: Nov 12, 1956 DOA: 06/22/2022     1 DOS: the patient was seen and examined on 06/25/2022   Brief hospital course: 66 year old male with past medical history of [name]'s disease with dyskinesia and recently hallucinations, paroxysmal A-fib presented to the ED on 06/22/2022 from his ALF due to progressively worsening difficulty with ambulating and found to have low BP by ALF staff.  His [name]'s medications have recently been adjusted as outpatient due to worsening hallucinations.  Sinemet frequency was increased, Requip was stopped, entacapone was continued.  Patient's vitals were stable in the ED.  CT head was unremarkable.  EKG was normal sinus rhythm with LBBB.  Labs were mostly unremarkable with mild stable anemia.  Patient was admitted to the hospital for further evaluation management including consultation with neurology.  Assessment and Plan: * Ambulatory dysfunction Patient has gait initiation failure secondary to [name]'s disease.  Recent medication changes with outpatient neurology may contribute to recent worsening. -- Neurology consulted, appreciate recommendations -- Requip resumed, increased to 1 mg TID -- Sinemet 25/100 adjusted as follows: IR formulation at 7 AM, 10 AM, 1 PM, 5 PM, and transition to CR formulation at 9 PM --Entacapone reduced from 6>>5 times daily -- PT evaluation -HH recommended -- Check orthostatic vitals -- Close outpatient neurology follow up after d/c  Hypotension Reportedly ALF staff found patient's BP to be low prompting ER visit.  Due to orthostatic changes common in [name]'s disease. -- Check orthostatic vitals -- Start trial of midodrine 5 mg BID WC and monitor for improvement, adjust as needed -- Monitor BP closely -- Maintain MAP above 65 with fluids if needed  7/28 AM: Patient noted profound dizziness with mobility tech.  BP checked and was 79/53. -- 500 cc bolus NS and  closely monitor\  7/29: BP's improved but pt still having orthostatic symptoms  [name]'s disease (HCC) Recently with worsening hallucinations as well as ambulatory dysfunction as outlined above. -- Plan as outlined, see ambulatory dysfunction --Appreciate neurology recommendations -- Neurology follow-up outpatient  Insomnia Pt reports chronic and requests a sleep aide. --Trial melatonin  Nocturnal cough Possibly due to reflux, unclear. Trial Protonix in the evening & monitor for improvement.  Normocytic anemia Hemoglobin stable a on admission 11.4 compared to recent 11.2.  Anemia panel checked on admission and within normal limits. -- Monitor CBC  Paroxysmal atrial fibrillation (HCC) In sinus rhythm on admission. Heart rate is controlled. -- Resume telemetry monitoring if patient becomes tachycardic        Subjective: Patient awake resting in bed when seen this AM.  Reports improvement in his leg restlessness with Requip resumed.  He reports a nighttime cough that prevents from sleeping, and feeling life pieces of food come up.  Denies choking with swallowing or symptoms of reflux or indigestion.    Physical Exam: Vitals:   06/24/22 2101 06/25/22 0529 06/25/22 0725 06/25/22 1605  BP: (!) 145/81 125/76 (!) 100/57 131/67  Pulse: 65 62 67 72  Resp: 16 16 18 20   Temp: 98.6 F (37 C) 98.2 F (36.8 C) 98.1 F (36.7 C) 98.2 F (36.8 C)  TempSrc:      SpO2: 100% 98% 97% 99%  Weight:      Height:       General exam: awake, alert, no acute distress HEENT: moist mucous membranes, hearing grossly normal, Respiratory system: on room air, normal respiratory effort. Cardiovascular system: RRR, no pedal edema.   Central nervous system: A&O x4.  Currently no resting tremor seen in arms, legs or head/neck Extremities: moves all, no edema, normal tone Skin: dry, intact, normal temperature Psychiatry: normal mood, congruent affect, judgement and insight appear normal   Data  Reviewed: No new labs today  Family Communication: none  Disposition: Status is: Inpatient Remains inpatient appropriate because: Ongoing hypotensive episodes and symptomatic orthostasis.  Medication changes underway that require close monitoring.     Planned Discharge Destination:  Return to ALF with [facility]    Time spent: 35 minutes  Author: Pennie Banter, DO 06/25/2022 4:46 PM  For on call review [email or web address].

## 2022-06-25 NOTE — Assessment & Plan Note (Signed)
Possibly due to reflux, unclear. Trial Protonix in the evening & monitor for improvement.

## 2022-06-26 DIAGNOSIS — R262 Difficulty in walking, not elsewhere classified: Secondary | ICD-10-CM | POA: Diagnosis not present

## 2022-06-26 LAB — BASIC METABOLIC PANEL
Anion gap: 6 (ref 5–15)
BUN: 26 mg/dL — ABNORMAL HIGH (ref 8–23)
CO2: 30 mmol/L (ref 22–32)
Calcium: 9 mg/dL (ref 8.9–10.3)
Chloride: 103 mmol/L (ref 98–111)
Creatinine, Ser: 0.84 mg/dL (ref 0.61–1.24)
GFR, Estimated: >60 mL/min (ref >60)
Glucose, Bld: 91 mg/dL (ref 70–99)
Potassium: 4.4 mmol/L (ref 3.5–5.1)
Sodium: 139 mmol/L (ref 135–145)

## 2022-06-26 MED ORDER — ORAL CARE MOUTH RINSE: 15.0000 mL | OROMUCOSAL | Status: DC | PRN

## 2022-06-26 NOTE — Progress Notes (Signed)
Progress Note   Patient: Corey Morrison QZE:[id number] DOB: December 11, 1955 DOA: 06/22/2022     2 DOS: the patient was seen and examined on 06/26/2022   Brief hospital course: 66 year old male with past medical history of [name]'s disease with dyskinesia and recently hallucinations, paroxysmal A-fib presented to the ED on 06/22/2022 from his ALF due to progressively worsening difficulty with ambulating and found to have low BP by ALF staff.  His [name]'s medications have recently been adjusted as outpatient due to worsening hallucinations.  Sinemet frequency was increased, Requip was stopped, entacapone was continued.  Patient's vitals were stable in the ED.  CT head was unremarkable.  EKG was normal sinus rhythm with LBBB.  Labs were mostly unremarkable with mild stable anemia.  Patient was admitted to the hospital for further evaluation management including consultation with neurology.  Assessment and Plan: * Ambulatory dysfunction Patient has gait initiation failure secondary to [name]'s disease.  Recent medication changes with outpatient neurology may contribute to recent worsening. -- Neurology consulted, appreciate recommendations -- Requip resumed, increased to 1 mg TID -- Sinemet 25/100 adjusted as follows: IR formulation at 7 AM, 10 AM, 1 PM, 5 PM, and transition to CR formulation at 9 PM --Entacapone reduced from 6>>5 times daily -- PT evaluation -HH recommended -- Check orthostatic vitals -- Close outpatient neurology follow up after d/c  Hypotension Reportedly ALF staff found patient's BP to be low prompting ER visit.  Due to orthostatic changes common in [name]'s disease. -- Repeat orthostatic vitals -- Started on midodrine 5 mg BID WC and patient notes improvement, continue -- Monitor BP closely -- Maintain MAP above 65 with fluids if needed  7/28 AM: Patient noted profound dizziness with mobility tech.  BP checked and was 79/53. -- 500 cc bolus NS and closely  monitor\ 7/29: BP's improved but pt still having orthostatic symptoms 7/30: Patient notes improvement with midodrine started yesterday  [name]'s disease (HCC) Recently with worsening hallucinations as well as ambulatory dysfunction as outlined above. -- Plan as outlined, see ambulatory dysfunction --Appreciate neurology recommendations -- Neurology follow-up outpatient  Insomnia Pt reports chronic and requests a sleep aide. --Trial melatonin  Nocturnal cough Possibly due to reflux, unclear. Trial Protonix in the evening & monitor for improvement.  Normocytic anemia Hemoglobin stable a on admission 11.4 compared to recent 11.2.  Anemia panel checked on admission and within normal limits. -- Monitor CBC  Paroxysmal atrial fibrillation (HCC) In sinus rhythm on admission. Heart rate is controlled. -- Resume telemetry monitoring if patient becomes tachycardic        Subjective: Patient sitting up in recliner when seen on rounds this morning.  He notes some improvement in his dizziness when getting up since midodrine was added yesterday.  He reports overall improvement since admission.  Physical Exam: Vitals:   06/25/22 1605 06/25/22 2020 06/26/22 0451 06/26/22 0823  BP: 131/67 106/63 (!) 149/80 (!) 147/98  Pulse: 72 72 63 71  Resp: 20 16 16 18   Temp: 98.2 F (36.8 C) 98.4 F (36.9 C) 98 F (36.7 C) (!) 97.5 F (36.4 C)  TempSrc:      SpO2: 99% 100% 100% 100%  Weight:      Height:       General exam: awake, alert, no acute distress HEENT: moist mucous membranes, hearing grossly normal, Respiratory system: on room air, normal respiratory effort. Cardiovascular system: RRR, no pedal edema.   Central nervous system: A&O x4.  Resting tremor present in neck and L>R upper extremity Extremities:  moves all, no edema, normal tone Skin: dry, intact, normal temperature Psychiatry: normal mood, congruent affect, judgement and insight appear normal   Data Reviewed: BUN 26  otherwise normal BMP  Family Communication: none  Disposition: Status is: Inpatient Remains inpatient appropriate because: Medication changes underway that require close monitoring.  ALF staff need to assess patient prior to returning to ALF, anticipate that will be tomorrow.     Planned Discharge Destination:  Return to ALF with [facility]    Time spent: 35 minutes  Author: Pennie Banter, DO 06/26/2022 3:31 PM  For on call review [email or web address].

## 2022-06-27 DIAGNOSIS — R262 Difficulty in walking, not elsewhere classified: Secondary | ICD-10-CM | POA: Diagnosis not present

## 2022-06-27 NOTE — Progress Notes (Signed)
Physical Therapy Treatment Patient Details Name: Corey Morrison MRN: [id number] DOB: 01-17-1956 Today's Date: 06/27/2022   History of Present Illness Per MD: Corey Morrison is a 66 y.o. male with history of [name]'s disease, paroxysmal atrial fibrillation was brought to the ER after patient was found to have low blood pressure at the assisted living facility.  Patient denies any dizziness loss of consciousness chest pain or palpitation.    PT Comments    Pt standing at bedside upon arrival to the room.  Pt seems somewhat confused during session today with off-kilter speech from previous sessions.  Nursing notified and upon speaking with Md, noted that his medications seems to make the patient this way during the PM.  Pt also noted to have urinated in the bed, while standing at the edge.  When asked, pt unable to give answer as to why he did it, just generally confused.  Pt's bed alarm was not set upon arrival either.  Pt donned clean gown and assisted to the hallway to participate in gait training.  Pt able to ambulate well with RW around the nursing station x2.  Pt then progressed to not using the RW and had much more functional gait pattern and was encouraged to take larger steps.  Therapist also added in additional large movements with his UE's to bring about large amplitude movements for [name]'s.  Pt then transferred back to bed with all needs met, call bell within reach, and bed alarm set.  Nursing present in room upon leaving.  Current discharge plans to HHPT at ALF remain appropriate at this time.  Pt will continue to benefit from skilled therapy in order to address deficits listed below.     Recommendations for follow up therapy are one component of a multi-disciplinary discharge planning process, led by the attending physician.  Recommendations may be updated based on patient status, additional functional criteria and insurance authorization.  Follow Up Recommendations  Home health PT  (HHPT at ALF.)     Assistance Recommended at Discharge Intermittent Supervision/Assistance  Patient can return home with the following A little help with walking and/or transfers;A little help with bathing/dressing/bathroom   Equipment Recommendations  None recommended by PT    Recommendations for Other Services       Precautions / Restrictions Precautions Precautions: Fall Restrictions Weight Bearing Restrictions: No     Mobility  Bed Mobility               General bed mobility comments: Pt standing upright upon arrival to the room with no one in room with pt.    Transfers Overall transfer level: Needs assistance Equipment used: Rolling walker (2 wheels) Transfers: Sit to/from Stand Sit to Stand: Min guard           General transfer comment: improved    Ambulation/Gait Ambulation/Gait assistance: Min guard Gait Distance (Feet): 740 Feet Assistive device: Rolling walker (2 wheels), None Gait Pattern/deviations: Step-through pattern, Steppage, Shuffle, Festinating       General Gait Details: Pt much more fluid with mobility today and was able to ambulate well without use of AD.   Stairs             Wheelchair Mobility    Modified Rankin (Stroke Patients Only)       Balance Overall balance assessment: Needs assistance Sitting-balance support: Feet supported, No upper extremity supported Sitting balance-Leahy Scale: Good     Standing balance support: During functional activity, No upper extremity supported Standing balance-Leahy Scale: Good  Cognition Arousal/Alertness: Awake/alert Behavior During Therapy: WFL for tasks assessed/performed Overall Cognitive Status: Within Functional Limits for tasks assessed                                 General Comments: Pt appeared much more confused in the PM, suspect due to medications according to MD/Nursing.        Exercises       General Comments        Pertinent Vitals/Pain Pain Assessment Pain Assessment: No/denies pain    Home Living                          Prior Function            PT Goals (current goals can now be found in the care plan section) Acute Rehab PT Goals Patient Stated Goal: to get his medications under control and get back to ALF. PT Goal Formulation: With patient Time For Goal Achievement: 07/07/22 Potential to Achieve Goals: Good Progress towards PT goals: Progressing toward goals    Frequency    Min 2X/week      PT Plan Current plan remains appropriate    Co-evaluation              AM-PAC PT "6 Clicks" Mobility   Outcome Measure  Help needed turning from your back to your side while in a flat bed without using bedrails?: A Little Help needed moving from lying on your back to sitting on the side of a flat bed without using bedrails?: A Little Help needed moving to and from a bed to a chair (including a wheelchair)?: A Little Help needed standing up from a chair using your arms (e.g., wheelchair or bedside chair)?: A Little Help needed to walk in hospital room?: A Little Help needed climbing 3-5 steps with a railing? : A Little 6 Click Score: 18    End of Session Equipment Utilized During Treatment: Gait belt Activity Tolerance: Patient tolerated treatment well Patient left: with call bell/phone within reach;in bed;with bed alarm set;with nursing/sitter in room Nurse Communication: Mobility status PT Visit Diagnosis: Unsteadiness on feet (R26.81);Other abnormalities of gait and mobility (R26.89);Muscle weakness (generalized) (M62.81);History of falling (Z91.81);Difficulty in walking, not elsewhere classified (R26.2);Other symptoms and signs involving the nervous system (R29.898)     Time: 8001-2393 PT Time Calculation (min) (ACUTE ONLY): 43 min  Charges:  $Gait Training: 23-37 mins $Therapeutic Activity: 8-22 mins                     Gwenlyn Saran, PT, DPT 06/27/22, 4:54 PM

## 2022-06-27 NOTE — TOC Progression Note (Addendum)
Transition of Care [facility]) - Progression Note    Patient Details  Name: Corey Morrison MRN: [id number] Date of Birth: August 25, 1956  Transition of Care [facility] CM/SW [place], [place] Phone Number: 06/27/2022, 10:03 AM  Clinical Narrative:     10:00 am :CSW called [facility] at [phone] who reports they need to come do a re assessment to ensure patient can return. They report re assessment to occure before lunch. They requested updated clinicals be faxed to [phone], clinicals sent.   2:37 pm: CSW called [facility] talked to Butch Penny who reports they are unable to come assess patient today and will need to come tomorrow. CSW attempted to discuss with Butch Penny how patient is walking 200 ft with walker, is medically stable to discharge however this CSW met with resistance from Butch Penny, informed Butch Penny this will be escalated to [facility] supervisor on call. TOC supervisor has lvm with Butch Penny to attempt to continue to have patient dc to facility today, as patient is medically stable and walking well with only HH PT recs.      Expected Discharge Plan:  (TBD) Barriers to Discharge: Continued Medical Work up  Expected Discharge Plan and Services Expected Discharge Plan:  (TBD)   Discharge Planning Services: CM Consult   Living arrangements for the past 2 months: Assisted Living Facility                                       Social Determinants of Health (SDOH) Interventions    Readmission Risk Interventions     No data to display

## 2022-06-27 NOTE — Plan of Care (Signed)

## 2022-06-27 NOTE — Care Management Important Message (Signed)
Important Message  Patient Details  Name: Corey Morrison MRN: [id number] Date of Birth: Sep 05, 1956   Medicare Important Message Given:  N/A - LOS <3 / Initial given by admissions     Olegario Messier A Leigha Olberding 06/27/2022, 9:05 AM

## 2022-06-27 NOTE — Discharge Summary (Incomplete)
Physician Discharge Summary   Patient: Corey Morrison MRN: [id number] DOB: 01-Mar-1956  Admit date:     06/22/2022  Discharge date: {dischdate:26783}  Discharge Physician: Pennie Banter   PCP: [facility]   Recommendations at discharge:  {Tip this will not be part of the note when signed- Example include specific recommendations for outpatient follow-up, pending tests to follow-up on. (Optional):26781}  ***  Discharge Diagnoses: Principal Problem:   Ambulatory dysfunction Active Problems:   Hypotension   [name]'s disease (HCC)   Paroxysmal atrial fibrillation (HCC)   Normocytic anemia   Nocturnal cough   Insomnia  Resolved Problems:   * No resolved hospital problems. *  Hospital Course: 66 year old male with past medical history of [name]'s disease with dyskinesia and recently hallucinations, paroxysmal A-fib presented to the ED on 06/22/2022 from his ALF due to progressively worsening difficulty with ambulating and found to have low BP by ALF staff.  His [name]'s medications have recently been adjusted as outpatient due to worsening hallucinations.  Sinemet frequency was increased, Requip was stopped, entacapone was continued.  Patient's vitals were stable in the ED.  CT head was unremarkable.  EKG was normal sinus rhythm with LBBB.  Labs were mostly unremarkable with mild stable anemia.  Patient was admitted to the hospital for further evaluation management including consultation with neurology.  Assessment and Plan: * Ambulatory dysfunction Patient has gait initiation failure secondary to [name]'s disease.  Recent medication changes with outpatient neurology may contribute to recent worsening. -- Neurology consulted, appreciate recommendations -- Requip resumed, increased to 1 mg TID -- Sinemet 25/100 adjusted as follows: IR formulation at 7 AM, 10 AM, 1 PM, 5 PM, and transition to CR formulation at 9 PM --Entacapone reduced from 6>>5 times  daily -- PT evaluation -HH recommended -- Check orthostatic vitals -- Close outpatient neurology follow up after d/c  Hypotension Reportedly ALF staff found patient's BP to be low prompting ER visit.  Due to orthostatic changes common in [name]'s disease. -- Repeat orthostatic vitals -- Started on midodrine 5 mg BID WC and patient notes improvement, continue -- Monitor BP closely -- Maintain MAP above 65 with fluids if needed  7/28 AM: Patient noted profound dizziness with mobility tech.  BP checked and was 79/53. -- 500 cc bolus NS and closely monitor\ 7/29: BP's improved but pt still having orthostatic symptoms 7/30: Patient notes improvement with midodrine started yesterday  [name]'s disease (HCC) Recently with worsening hallucinations as well as ambulatory dysfunction as outlined above. -- Plan as outlined, see ambulatory dysfunction --Appreciate neurology recommendations -- Neurology follow-up outpatient  Insomnia Pt reports chronic and requests a sleep aide. --Trial melatonin  Nocturnal cough Possibly due to reflux, unclear. Trial Protonix in the evening & monitor for improvement.  Normocytic anemia Hemoglobin stable a on admission 11.4 compared to recent 11.2.  Anemia panel checked on admission and within normal limits. -- Monitor CBC  Paroxysmal atrial fibrillation (HCC) In sinus rhythm on admission. Heart rate is controlled. -- Resume telemetry monitoring if patient becomes tachycardic      {Tip this will not be part of the note when signed Body mass index is 19.2 kg/m. , ,  (Optional):26781}  {(NOTE) Pain control PDMP Statment (Optional):26782} Consultants: *** Procedures performed: ***  Disposition: {Plan; Disposition:26390} Diet recommendation:  {Diet_Plan:26776} DISCHARGE MEDICATION: Allergies as of 06/27/2022   No Known Allergies   Med Rec must be completed prior to using this [facility]***       Discharge Exam: [employer]  06/22/22 1616  Weight: 59 kg   ***  Condition at discharge: {DC Condition:26389}  The results of significant diagnostics from this hospitalization (including imaging, microbiology, ancillary and laboratory) are listed below for reference.   Imaging Studies: CT HEAD WO CONTRAST ( )  Result Date: 06/22/2022 CLINICAL DATA:  Neurological deficit. EXAM: CT HEAD WITHOUT CONTRAST TECHNIQUE: Contiguous axial images were obtained from the base of the skull through the vertex without intravenous contrast. RADIATION DOSE REDUCTION: This exam was performed according to the departmental dose-optimization program which includes automated exposure control, adjustment of the mA and/or kV according to patient size and/or use of iterative reconstruction technique. COMPARISON:  May 28, 2022 FINDINGS: Brain: There is mild cerebral atrophy with widening of the extra-axial spaces and ventricular dilatation. There are areas of decreased attenuation within the white matter tracts of the supratentorial brain, consistent with microvascular disease changes. Vascular: No hyperdense vessel or unexpected calcification. Skull: Normal. Negative for fracture or focal lesion. Sinuses/Orbits: No acute finding. Other: None. IMPRESSION: 1. No acute intracranial abnormality. 2. Generalized cerebral atrophy with chronic white matter small vessel ischemic changes. Electronically Signed   By: Aram Candela M.D.   On: 06/22/2022 22:11   DG Chest 2 View  Result Date: 06/22/2022 CLINICAL DATA:  Dizziness.  Mid back pain.  History of [name]'s. EXAM: CHEST - 2 VIEW COMPARISON:  AP chest 07/15/2020 FINDINGS: Cardiac silhouette and mediastinal contours are within normal limits. Mild calcification within the aortic arch. A coronary artery stent is again seen. The lungs are clear. No pleural effusion or pneumothorax. Mild-to-moderate multilevel degenerative disc changes of the thoracic spine. IMPRESSION: No active cardiopulmonary disease.  Electronically Signed   By: Neita Garnet M.D.   On: 06/22/2022 17:01    Microbiology: Results for orders placed or performed during the hospital encounter of 06/11/22  Resp Panel by RT-PCR (Flu A&B, Covid) Anterior Nasal Swab     Status: None   Collection Time: 06/11/22 12:35 PM   Specimen: Anterior Nasal Swab  Result Value Ref Range Status   SARS Coronavirus 2 by RT PCR NEGATIVE NEGATIVE Final    Comment: (NOTE) SARS-CoV-2 target nucleic acids are NOT DETECTED.  The SARS-CoV-2 RNA is generally detectable in upper respiratory specimens during the acute phase of infection. The lowest concentration of SARS-CoV-2 viral copies this assay can detect is 138 copies/mL. A negative result does not preclude SARS-Cov-2 infection and should not be used as the sole basis for treatment or other patient management decisions. A negative result may occur with  improper specimen collection/handling, submission of specimen other than nasopharyngeal swab, presence of viral mutation(s) within the areas targeted by this assay, and inadequate number of viral copies(<138 copies/mL). A negative result must be combined with clinical observations, patient history, and epidemiological information. The expected result is Negative.  Fact Sheet for Patients:  BloggerCourse.com  Fact Sheet for Healthcare Providers:  SeriousBroker.it  This test is no t yet approved or cleared by the [place] FDA and  has been authorized for detection and/or diagnosis of SARS-CoV-2 by FDA under an Emergency Use Authorization (EUA). This EUA will remain  in effect (meaning this test can be used) for the duration of the COVID-19 declaration under Section 564(b)(1) of the Act, 21 U.S.C.section 360bbb-3(b)(1), unless the authorization is terminated  or revoked sooner.       Influenza A by PCR NEGATIVE NEGATIVE Final   Influenza B by PCR NEGATIVE NEGATIVE Final    Comment:  (NOTE) The Xpert Xpress SARS-CoV-2/FLU/RSV  plus assay is intended as an aid in the diagnosis of influenza from Nasopharyngeal swab specimens and should not be used as a sole basis for treatment. Nasal washings and aspirates are unacceptable for Xpert Xpress SARS-CoV-2/FLU/RSV testing.  Fact Sheet for Patients: BloggerCourse.com  Fact Sheet for Healthcare Providers: SeriousBroker.it  This test is not yet approved or cleared by the [place] FDA and has been authorized for detection and/or diagnosis of SARS-CoV-2 by FDA under an Emergency Use Authorization (EUA). This EUA will remain in effect (meaning this test can be used) for the duration of the COVID-19 declaration under Section 564(b)(1) of the Act, 21 U.S.C. section 360bbb-3(b)(1), unless the authorization is terminated or revoked.  Performed at [facility], [address] [place], [place] [address]     Labs: CBC: Recent Labs  Lab 06/22/22 1619 06/23/22 0522  WBC 8.0 8.7  HGB 11.4* 10.1*  HCT 36.0* 31.5*  MCV 92.8 91.0  PLT 191 165   Basic Metabolic Panel: Recent Labs  Lab 06/22/22 1619 06/23/22 0522 06/26/22 0625  NA 144 145 139  K 3.8 4.0 4.4  CL 108 111 103  CO2 28 27 30   GLUCOSE 104* 101* 91  BUN 17 23 26*  CREATININE 1.11 0.83 0.84  CALCIUM 8.6* 8.3* 9.0   Liver Function Tests: No results for input(s): "AST", "ALT", "ALKPHOS", "BILITOT", "PROT", "ALBUMIN" in the last 168 hours. CBG: No results for input(s): "GLUCAP" in the last 168 hours.  Discharge time spent: {LESS THAN/GREATER 30 minutes.  Signed: [id number]}, DO [facility] 06/27/2022

## 2022-06-28 DIAGNOSIS — R262 Difficulty in walking, not elsewhere classified: Secondary | ICD-10-CM | POA: Diagnosis not present

## 2022-06-28 MED ORDER — ENTACAPONE 200 MG PO TABS: 200.0000 mg | ORAL_TABLET | Freq: Every day | ORAL | 2 refills | Status: DC

## 2022-06-28 MED ORDER — CARBIDOPA-LEVODOPA ER 25-100 MG PO TBCR: 1.0000 | EXTENDED_RELEASE_TABLET | Freq: Every day | ORAL | 2 refills | Status: DC

## 2022-06-28 MED ORDER — MIDODRINE HCL 5 MG PO TABS
5.0000 mg | ORAL_TABLET | Freq: Three times a day (TID) | ORAL | 2 refills | Status: DC
Start: 2022-06-28 — End: 2022-07-28

## 2022-06-28 MED ORDER — ROPINIROLE HCL 1 MG PO TABS: 1.0000 mg | ORAL_TABLET | Freq: Three times a day (TID) | ORAL | 2 refills | Status: DC

## 2022-06-28 MED ORDER — RISPERIDONE 0.5 MG PO TABS
0.5000 mg | ORAL_TABLET | Freq: Two times a day (BID) | ORAL | Status: DC
  Administered 2022-06-28: 0.5 mg via ORAL
  Filled 2022-06-28: qty 1

## 2022-06-28 MED ORDER — MELATONIN 5 MG PO TABS: 5.0000 mg | ORAL_TABLET | Freq: Every day | ORAL | 2 refills | Status: DC

## 2022-06-28 MED ORDER — POLYETHYLENE GLYCOL 3350 17 G PO PACK: 17.0000 g | PACK | Freq: Every day | ORAL | 0 refills | Status: DC | PRN

## 2022-06-28 MED ORDER — CITALOPRAM HYDROBROMIDE 40 MG PO TABS: 40.0000 mg | ORAL_TABLET | Freq: Every day | ORAL | 2 refills | Status: DC

## 2022-06-28 MED ORDER — EZETIMIBE 10 MG PO TABS
10.0000 mg | ORAL_TABLET | Freq: Every day | ORAL | Status: DC
  Administered 2022-06-29: 10 mg via ORAL
  Filled 2022-06-28: qty 1

## 2022-06-28 MED ORDER — MIDODRINE HCL 5 MG PO TABS
5.0000 mg | ORAL_TABLET | Freq: Three times a day (TID) | ORAL | Status: DC
  Administered 2022-06-28 – 2022-06-29 (×3): 5 mg via ORAL
  Filled 2022-06-28 (×3): qty 1

## 2022-06-28 MED ORDER — CARBIDOPA-LEVODOPA 25-100 MG PO TABS: 1.0000 | ORAL_TABLET | Freq: Four times a day (QID) | ORAL | 2 refills | Status: DC

## 2022-06-28 MED ORDER — ASPIRIN 81 MG PO TBEC
81.0000 mg | DELAYED_RELEASE_TABLET | Freq: Every day | ORAL | Status: DC
  Administered 2022-06-29: 81 mg via ORAL
  Filled 2022-06-28: qty 1

## 2022-06-28 NOTE — Progress Notes (Signed)
Progress Note   Patient: Corey Morrison HQI:[id number] DOB: [birth year] DOA: 06/22/2022     4 DOS: the patient was seen and examined on 06/28/2022   Brief hospital course: 66 year old male with past medical history of [name]'s disease with dyskinesia and recently hallucinations, paroxysmal A-fib presented to the ED on 06/22/2022 from his ALF due to progressively worsening difficulty with ambulating and found to have low BP by ALF staff.  His [name]'s medications have recently been adjusted as outpatient due to worsening hallucinations.  Sinemet frequency was increased, Requip was stopped, entacapone was continued.  Patient's vitals were stable in the ED.  CT head was unremarkable.  EKG was normal sinus rhythm with LBBB.  Labs were mostly unremarkable with mild stable anemia.  Patient was admitted to the hospital for further evaluation management including consultation with neurology.  Assessment and Plan: * Ambulatory dysfunction Patient has gait initiation failure secondary to [name]'s disease.  Recent medication changes with outpatient neurology may contribute to recent worsening. -- Neurology consulted, appreciate recommendations -- Requip resumed, increased to 1 mg TID -- Sinemet 25/100 adjusted as follows: IR formulation at 7 AM, 10 AM, 1 PM, 5 PM, and transition to CR formulation at 9 PM --Entacapone reduced from 6>>5 times daily -- PT evaluation -HH recommended -- Check orthostatic vitals -- Close outpatient neurology follow up after d/c  Hypotension Reportedly ALF staff found patient's BP to be low prompting ER visit.  Due to orthostatic changes common in [name]'s disease. -- Repeat orthostatic vitals -- Started on midodrine 5 mg BID WC and patient notes improvement, continue -- Monitor BP closely -- Maintain MAP above 65 with fluids if needed  7/28 AM: Patient noted profound dizziness with mobility tech.  BP checked and was 79/53. -- 500 cc bolus NS and closely  monitor\ 7/29: BP's improved but pt still having orthostatic symptoms 7/30: Patient notes improvement with midodrine started yesterday  [name]'s disease (HCC) Recently with worsening hallucinations as well as ambulatory dysfunction as outlined above. -- Plan as outlined, see ambulatory dysfunction --Appreciate neurology recommendations -- Neurology follow-up outpatient  Insomnia Pt reports chronic and requests a sleep aide. --Trial melatonin  Nocturnal cough Possibly due to reflux, unclear. Trial Protonix in the evening & monitor for improvement.  Normocytic anemia Hemoglobin stable a on admission 11.4 compared to recent 11.2.  Anemia panel checked on admission and within normal limits. -- Monitor CBC  Paroxysmal atrial fibrillation (HCC) In sinus rhythm on admission. Heart rate is controlled. -- Resume telemetry monitoring if patient becomes tachycardic        Subjective: Patient seen ambulating the unit independently with walker, later in room seated in recliner. Reports feeling much better and ambulating well.  He reports intermittently having some movement difficulty but it improves after medications.  Orthostatic dizziness better with midodrine.   No other acute complaints.  Eager to go home.  Physical Exam: Vitals:   06/27/22 1554 06/27/22 1935 06/28/22 0512 06/28/22 0804  BP: (!) 170/82 120/69 (!) 175/95 123/76  Pulse: 74 78 72 61  Resp: 18 20  17   Temp: 98.7 F (37.1 C) (!) 97.5 F (36.4 C) 97.9 F (36.6 C) 98.6 F (37 C)  TempSrc:  Oral  Oral  SpO2: 100% 100% 100% 99%  Weight:      Height:       General exam: awake, alert, no acute distress HEENT: moist mucous membranes, hearing grossly normal, Respiratory system: on room air, normal respiratory effort. Cardiovascular system: RRR, no pedal edema.  Central nervous system: A&O x4.  Resting tremor present in neck and L>R upper extremity Extremities: moves all, no edema, normal tone Skin: dry, intact,  normal temperature Psychiatry: normal mood, congruent affect, judgement and insight appear normal   Data Reviewed: No new labs today  Family Communication: none  Disposition: Status is: Inpatient Remains inpatient appropriate because: ALF staff to come reassess pt before he returns there & they declined to come today.  Medically stable but unable to d/c to ALF/home.     Planned Discharge Destination:  Return to ALF with [facility]    Time spent: 35 minutes  Author: Pennie Banter, DO 06/28/2022 8:49 AM  For on call review [email or web address].

## 2022-06-28 NOTE — Progress Notes (Signed)
PT Cancellation Note  Patient Details Name: Corey Morrison MRN: [id number] DOB: 27-Dec-1955   Cancelled Treatment:    Reason Eval/Treat Not Completed: Other (comment).  Pt deferred mobility as he has already been up ambulating with nursing.  Pt with notable sporadic, dance-like movements in UE's, LE's, and head control.  Nursing notified.  Will re-attempt after meds have been given.   Nolon Bussing, PT, DPT 06/28/22, 1:31 PM

## 2022-06-28 NOTE — Progress Notes (Signed)
Physical Therapy Treatment Patient Details Name: Corey Morrison MRN: [id number] DOB: 11/02/1956 Today's Date: 06/28/2022   History of Present Illness Per MD: Corey Morrison is a 66 y.o. male with history of [name]'s disease, paroxysmal atrial fibrillation was brought to the ER after patient was found to have low blood pressure at the assisted living facility.  Patient denies any dizziness loss of consciousness chest pain or palpitation.    PT Comments    Pt put forth good effort throughout session.  Pt noted to have more sporadic movements today than in previous sessions, but cognitively, was doing much better.  Pt able to ambulate without the use of the walker today for the full bouts of ambulation training.  Pt unable to take larger steps without needing assistance with balance.  Pt also struggled with larger amplitude movements during gait as well.  Pt transitioned back to recliner with all needs met and chair alarm set.  Current discharge plans to HHPT remain appropriate at this time.  Pt will continue to benefit from skilled therapy in order to address deficits listed below.    Recommendations for follow up therapy are one component of a multi-disciplinary discharge planning process, led by the attending physician.  Recommendations may be updated based on patient status, additional functional criteria and insurance authorization.  Follow Up Recommendations  Home health PT (HHPT at ALF.)     Assistance Recommended at Discharge Intermittent Supervision/Assistance  Patient can return home with the following A little help with walking and/or transfers;A little help with bathing/dressing/bathroom   Equipment Recommendations  None recommended by PT    Recommendations for Other Services       Precautions / Restrictions Precautions Precautions: Fall Restrictions Weight Bearing Restrictions: No     Mobility  Bed Mobility               General bed mobility comments: Pt standing  upright upon arrival to the room with no one in room with pt.    Transfers Overall transfer level: Needs assistance Equipment used: Rolling walker (2 wheels) Transfers: Sit to/from Stand Sit to Stand: Min guard           General transfer comment: improved    Ambulation/Gait Ambulation/Gait assistance: Min guard Gait Distance (Feet): 740 Feet Assistive device: None Gait Pattern/deviations: Step-through pattern, Steppage, Shuffle, Festinating Gait velocity: decreased     General Gait Details: Pt ambulates better without walker, but unable to take larger steps today.   Stairs             Wheelchair Mobility    Modified Rankin (Stroke Patients Only)       Balance Overall balance assessment: Needs assistance Sitting-balance support: Feet supported, No upper extremity supported Sitting balance-Leahy Scale: Good     Standing balance support: During functional activity, No upper extremity supported Standing balance-Leahy Scale: Good                              Cognition Arousal/Alertness: Awake/alert Behavior During Therapy: WFL for tasks assessed/performed Overall Cognitive Status: Within Functional Limits for tasks assessed                                 General Comments: Pt having more sporadic movements in sitting position.  Pt also with increased difficulty with larger steps, a task he was able to perform well with yesterday.  Exercises      General Comments        Pertinent Vitals/Pain Pain Assessment Pain Assessment: No/denies pain    Home Living                          Prior Function            PT Goals (current goals can now be found in the care plan section) Acute Rehab PT Goals Patient Stated Goal: to get his medications under control and get back to ALF. PT Goal Formulation: With patient Time For Goal Achievement: 07/07/22 Potential to Achieve Goals: Good Progress towards PT goals:  Progressing toward goals    Frequency    Min 2X/week      PT Plan Current plan remains appropriate    Co-evaluation              AM-PAC PT "6 Clicks" Mobility   Outcome Measure  Help needed turning from your back to your side while in a flat bed without using bedrails?: A Little Help needed moving from lying on your back to sitting on the side of a flat bed without using bedrails?: A Little Help needed moving to and from a bed to a chair (including a wheelchair)?: A Little Help needed standing up from a chair using your arms (e.g., wheelchair or bedside chair)?: A Little Help needed to walk in hospital room?: A Little Help needed climbing 3-5 steps with a railing? : A Little 6 Click Score: 18    End of Session Equipment Utilized During Treatment: Gait belt Activity Tolerance: Patient tolerated treatment well Patient left: with call bell/phone within reach;in bed;with bed alarm set;with nursing/sitter in room Nurse Communication: Mobility status PT Visit Diagnosis: Unsteadiness on feet (R26.81);Other abnormalities of gait and mobility (R26.89);Muscle weakness (generalized) (M62.81);History of falling (Z91.81);Difficulty in walking, not elsewhere classified (R26.2);Other symptoms and signs involving the nervous system (R29.898)     Time: 1430-1455 PT Time Calculation (min) (ACUTE ONLY): 25 min  Charges:  $Gait Training: 23-37 mins                     Gwenlyn Saran, PT, DPT 06/28/22, 3:32 PM

## 2022-06-28 NOTE — Progress Notes (Signed)
Mobility Specialist - Progress Note    06/28/22 1100  Mobility  Activity Stood at bedside  Level of Assistance Standby assist, set-up cues, supervision of patient - no hands on  Assistive Device Front wheel walker  Distance Ambulated (ft) 0 ft  Activity Response Tolerated well  $Mobility charge 1 Mobility    Pt sitting in recliner upon arrival using RA. Voices feeling more "jumpy" at the moment but voices no other complaints. BP sitting is 91/53. Pt completes STS supervision, no assist this date. BP standing reads as 81/42, RN notified. Pt voices feeling lightheaded and dizzy, but motivated to ambulated. Author decided to terminate session until later date and time d/t BP. Pt left in recliner with needs in reach and chair alarm set.  Corey Morrison Mobility Specialist 06/28/22, 11:30 AM

## 2022-06-28 NOTE — [place] FL2 (Signed)
[place] MEDICAID FL2 LEVEL OF CARE SCREENING TOOL     IDENTIFICATION  Patient Name: Corey Morrison Birthdate: [birth year] Sex: male Admission Date (Current Location): 06/22/2022  [place] and [place] Number:  Chiropodist and Address:  [facility], [address], [place], [place] [address]      Provider Number: [id number]  Attending Physician Name and Address:  Pennie Banter, DO  Relative Name and Phone Number:  [name] Clearence Cheek)   [phone] [facility]);Neldon Mc     [phone]    Current Level of Care: Hospital Recommended Level of Care: Assisted Living Facility Prior Approval Number:    Date Approved/Denied:   PASRR Number:    Discharge Plan: Other (Comment) (Assisted LIving)    Current Diagnoses: Patient Active Problem List   Diagnosis Date Noted   Nocturnal cough 06/25/2022   Insomnia 06/25/2022   Hypotension 06/23/2022   Paroxysmal atrial fibrillation (HCC) 06/23/2022   Normocytic anemia 06/23/2022   Ambulatory dysfunction 06/22/2022   [name]'s disease (HCC) 06/11/2022    Orientation RESPIRATION BLADDER Height & Weight     Self, Situation, Place (Forgetful, memory impairment)  Normal Continent Weight: 59 kg Height:  5\' 9"  (175.3 cm)  BEHAVIORAL SYMPTOMS/MOOD NEUROLOGICAL BOWEL NUTRITION STATUS      Continent Diet (Heart)  AMBULATORY STATUS COMMUNICATION OF NEEDS Skin   Limited Assist (Guard) Verbally Normal                       Personal Care Assistance Level of Assistance  Bathing, Feeding, Dressing Bathing Assistance: Limited assistance Feeding assistance: Limited assistance Dressing Assistance: Limited assistance     Functional Limitations Info  Sight, Hearing, Speech Sight Info: Adequate Hearing Info: Adequate Speech Info: Adequate    SPECIAL CARE FACTORS FREQUENCY                       Contractures Contractures Info: Not present    Additional Factors Info  Code Status,  Allergies Code Status Info: FULL CODE Allergies Info: No known allergies           Current Medications (06/28/2022):  This is the current hospital active medication list Current Facility-Administered Medications  Medication Dose Route Frequency Provider Last Rate Last Admin   acetaminophen (TYLENOL) tablet 650 mg  650 mg Oral Q6H PRN 08/28/2022 A, DO   650 mg at 06/27/22 2040   carbidopa-levodopa (SINEMET IR) 25-100 MG per tablet immediate release 1 tablet  1 tablet Oral QID 2041, MD   1 tablet at 06/28/22 1625   Carbidopa-Levodopa ER (SINEMET CR) 25-100 MG tablet controlled release 1 tablet  1 tablet Oral QHS 08/28/22, MD   1 tablet at 06/27/22 2041   citalopram (CELEXA) tablet 40 mg  40 mg Oral Daily 2042, MD   40 mg at 06/28/22 0946   enoxaparin (LOVENOX) injection 40 mg  40 mg Subcutaneous Q24H 08/28/22, MD   40 mg at 06/28/22 0856   entacapone (COMTAN) tablet 200 mg  200 mg Oral 5 X Daily 08/28/22, MD   200 mg at 06/28/22 1625   melatonin tablet 5 mg  5 mg Oral QHS 08/28/22 A, DO   5 mg at 06/27/22 2040   midodrine (PROAMATINE) tablet 5 mg  5 mg Oral TID WC 2041 A, DO   5 mg at 06/28/22 1216   Oral care mouth rinse  15 mL Mouth Rinse PRN 08/28/22  A, DO       pantoprazole (PROTONIX) EC tablet 40 mg  40 mg Oral QPM Bernis, Schreur A, DO   40 mg at 06/27/22 1742   polyethylene glycol (MIRALAX / GLYCOLAX) packet 17 g  17 g Oral Daily Keison, Glendinning A, DO   17 g at 06/28/22 0946   rOPINIRole (REQUIP) tablet 1 mg  1 mg Oral TID Milon Dikes, MD   1 mg at 06/28/22 1625     Discharge Medications:  TAKE these medications     acetaminophen 325 MG tablet Commonly known as: TYLENOL Take 650 mg by mouth every 6 (six) hours as needed for mild pain or moderate pain.    aspirin EC 81 MG tablet Take 81 mg by mouth daily.    capsicum 0.075 % topical cream Commonly known as: ZOSTRIX Apply 1 Application topically 2  (two) times daily. (Apply to feet and thighs)    carbidopa-levodopa 25-100 MG tablet Commonly known as: SINEMET IR Take 1 tablet by mouth 4 (four) times daily. At 7am, 10am, 1pm, 5pm What changed:  when to take this additional instructions    Carbidopa-Levodopa ER 25-100 MG tablet controlled release Commonly known as: SINEMET CR Take 1 tablet by mouth at bedtime. At 9:00 PM. What changed: You were already taking a medication with the same name, and this prescription was added. Make sure you understand how and when to take each.    citalopram 40 MG tablet Commonly known as: CELEXA Take 1 tablet (40 mg total) by mouth daily. Start taking on: June 29, 2022    cyanocobalamin 1000 MCG tablet Commonly known as: VITAMIN B12 Take 1,000 mcg by mouth daily.    entacapone 200 MG tablet Commonly known as: COMTAN Take 1 tablet (200 mg total) by mouth 5 (five) times daily. Give WITH carbidopa-levodopa at 7am, 10am, 1pm, 5pm, 9pm What changed:  when to take this additional instructions    esomeprazole 20 MG capsule Commonly known as: NEXIUM Take 20 mg by mouth daily.    ezetimibe 10 MG tablet Commonly known as: ZETIA Take 10 mg by mouth daily.    fluticasone 50 MCG/ACT nasal spray Commonly known as: FLONASE Place 1 spray into both nostrils daily.    melatonin 5 MG Tabs Take 1 tablet (5 mg total) by mouth at bedtime.    midodrine 5 MG tablet Commonly known as: PROAMATINE Take 1 tablet (5 mg total) by mouth 3 (three) times daily with meals.    polyethylene glycol 17 g packet Commonly known as: MIRALAX / GLYCOLAX Take 17 g by mouth daily as needed for mild constipation.    psyllium 0.52 g capsule Commonly known as: REGULOID Take 0.52 g by mouth at bedtime.    risperiDONE 0.5 MG tablet Commonly known as: RISPERDAL Take 1 tablet (0.5 mg total) by mouth 2 (two) times daily.    rOPINIRole 1 MG tablet Commonly known as: REQUIP Take 1 tablet (1 mg total) by mouth 3 (three)  times daily. What changed:  medication strength how much to take when to take this    Vitamin D 50 MCG (2000 UT) Caps Take 2,000 Units by mouth daily.        Additional Information SSN [id number]  Caryn Section, RN

## 2022-06-28 NOTE — Care Management (Addendum)
[facility] needs FL2 and can transport tomorrow at [phone]. Phone: [phone]  Fax [phone]

## 2022-06-28 NOTE — Care Management Important Message (Signed)
Important Message  Patient Details  Name: Corey Morrison MRN: [id number] Date of Birth: 05-Oct-1956   Medicare Important Message Given:  Yes     Olegario Messier A Tanaisha Pittman 06/28/2022, 10:37 AM

## 2022-06-29 DIAGNOSIS — R531 Weakness: Secondary | ICD-10-CM

## 2022-06-29 DIAGNOSIS — G20 Parkinson's disease: Secondary | ICD-10-CM | POA: Diagnosis not present

## 2022-06-29 DIAGNOSIS — I959 Hypotension, unspecified: Secondary | ICD-10-CM

## 2022-06-29 DIAGNOSIS — R262 Difficulty in walking, not elsewhere classified: Secondary | ICD-10-CM | POA: Diagnosis not present

## 2022-06-29 DIAGNOSIS — I480 Paroxysmal atrial fibrillation: Secondary | ICD-10-CM

## 2022-06-29 DIAGNOSIS — G4700 Insomnia, unspecified: Secondary | ICD-10-CM

## 2022-06-29 LAB — CREATININE, SERUM
Creatinine, Ser: 0.8 mg/dL (ref 0.61–1.24)
GFR, Estimated: >60 mL/min (ref >60)

## 2022-06-29 NOTE — TOC Progression Note (Signed)
Transition of Care [facility]) - Progression Note    Patient Details  Name: Corey Morrison MRN: [id number] Date of Birth: 1956-06-14  Transition of [facility]) CM/SW Contact  Caryn Section, RN Phone Number: 06/29/2022, 10:10 AM  Clinical Narrative:   [facility] can return to [facility] today, ok to return as per [facility].  Facility will pick patient up at 1115 am.    Expected Discharge Plan:  (TBD) Barriers to Discharge: Continued Medical Work up  Expected Discharge Plan and Services Expected Discharge Plan:  (TBD)   Discharge Planning Services: CM Consult   Living arrangements for the past 2 months: Assisted Living Facility Expected Discharge Date: 06/29/22                                     Social Determinants of Health (SDOH) Interventions    Readmission Risk Interventions     No data to display

## 2022-06-29 NOTE — Discharge Summary (Signed)
Physician Discharge Summary   Patient: Corey Morrison MRN: [id number] DOB: 1956/04/21  Admit date:     06/22/2022  Discharge date: 06/29/22  Discharge Physician: Alford Highland   PCP: Almetta Lovely, [facility]   Recommendations at discharge:   Follow-up with doctors making house calls 5 days Follow-up Dr. Malvin Johns neurology 2 weeks  Discharge Diagnoses: Principal Problem:   Ambulatory dysfunction Active Problems:   Hypotension   [name]'s disease (HCC)   Paroxysmal atrial fibrillation (HCC)   Normocytic anemia   Nocturnal cough   Insomnia   Hospital Course: 66 year old male with past medical history of [name]'s disease with dyskinesia and recently hallucinations, paroxysmal A-fib presented to the ED on 06/22/2022 from his ALF due to progressively worsening difficulty with ambulating and found to have low BP by ALF staff.  His [name]'s medications have recently been adjusted as outpatient due to worsening hallucinations.  Sinemet frequency was increased, Requip was stopped, entacapone was continued.  Patient's vitals were stable in the ED.  CT head was unremarkable.  EKG was normal sinus rhythm with LBBB.  Labs were mostly unremarkable with mild stable anemia.  Patient was admitted to the hospital for further evaluation management including consultation with neurology.  Inpatient neurology made adjustments in [name]'s medications.  He will need to follow-up with neurology as outpatient.  The patient improved with physical therapy and patient will go out to an assisted living facility with physical therapy home health.  Assessment and Plan: * Ambulatory dysfunction Patient has gait initiation failure secondary to [name]'s disease.  Recent medication changes with outpatient neurology may contribute to recent worsening. -- Neurology consulted, appreciate recommendations -- Requip resumed, increased to 1 mg TID -- Sinemet 25/100 adjusted as follows:  IR formulation at  7 AM, 10 AM, 1 PM, 5 PM CR formulation (long-acting) at 9 PM -- Entacapone reduced from 6>>5 times daily to be given with Sinemet --Started on Risperdal by in July due to hallucinations - continue -- PT evaluation -HH recommended -- Close outpatient neurology follow up after d/c  Hypotension Reportedly ALF staff found patient's BP to be low prompting ER visit.  Due to orthostatic changes common in [name]'s disease. -- Midodrine 5 mg TID WC and patient notes improvement, continue -- Monitor BP closely  7/28 AM: Patient noted profound dizziness with mobility tech.  BP checked and was 79/53. -- 500 cc bolus NS and closely monitor\ 7/29: BP's improved but pt still having orthostatic symptoms, started midodrine 7/30: Patient notes improvement with midodrine started yesterday 8/1: Increased midodrine to 5 mg TID  [name]'s disease (HCC) Recently with worsening hallucinations as well as ambulatory dysfunction as outlined above. -- Plan as outlined, see ambulatory dysfunction --Appreciate neurology recommendations -- Neurology follow-up outpatient  Insomnia Pt reports chronic and requests a sleep aide. --Trial melatonin  Nocturnal cough Possibly due to reflux, unclear. Continue PPI (on Nexium at home)  Normocytic anemia Hemoglobin stable a on admission 11.4 compared to recent 11.2.  Anemia panel checked on admission and within normal limits. -- Monitor CBC  Paroxysmal atrial fibrillation (HCC) In sinus rhythm on admission. Heart rate is controlled.          Consultants: Neurology Procedures performed: None Disposition: Assisted living Diet recommendation:  Regular diet DISCHARGE MEDICATION: Allergies as of 06/29/2022   No Known Allergies      Medication List     STOP taking these medications    sertraline 100 MG tablet Commonly known as: ZOLOFT       TAKE these  medications    acetaminophen 325 MG tablet Commonly known as: TYLENOL Take 650 mg by  mouth every 6 (six) hours as needed for mild pain or moderate pain.   aspirin EC 81 MG tablet Take 81 mg by mouth daily.   capsicum 0.075 % topical cream Commonly known as: ZOSTRIX Apply 1 Application topically 2 (two) times daily. (Apply to feet and thighs)   carbidopa-levodopa 25-100 MG tablet Commonly known as: SINEMET IR Take 1 tablet by mouth 4 (four) times daily. At 7am, 10am, 1pm, 5pm What changed:  when to take this additional instructions   Carbidopa-Levodopa ER 25-100 MG tablet controlled release Commonly known as: SINEMET CR Take 1 tablet by mouth at bedtime. At 9:00 PM. What changed: You were already taking a medication with the same name, and this prescription was added. Make sure you understand how and when to take each.   citalopram 40 MG tablet Commonly known as: CELEXA Take 1 tablet (40 mg total) by mouth daily.   cyanocobalamin 1000 MCG tablet Commonly known as: VITAMIN B12 Take 1,000 mcg by mouth daily.   entacapone 200 MG tablet Commonly known as: COMTAN Take 1 tablet (200 mg total) by mouth 5 (five) times daily. Give WITH carbidopa-levodopa at 7am, 10am, 1pm, 5pm, 9pm What changed:  when to take this additional instructions   esomeprazole 20 MG capsule Commonly known as: NEXIUM Take 20 mg by mouth daily.   ezetimibe 10 MG tablet Commonly known as: ZETIA Take 10 mg by mouth daily.   fluticasone 50 MCG/ACT nasal spray Commonly known as: FLONASE Place 1 spray into both nostrils daily.   melatonin 5 MG Tabs Take 1 tablet (5 mg total) by mouth at bedtime.   midodrine 5 MG tablet Commonly known as: PROAMATINE Take 1 tablet (5 mg total) by mouth 3 (three) times daily with meals.   polyethylene glycol 17 g packet Commonly known as: MIRALAX / GLYCOLAX Take 17 g by mouth daily as needed for mild constipation.   psyllium 0.52 g capsule Commonly known as: REGULOID Take 0.52 g by mouth at bedtime.   risperiDONE 0.5 MG tablet Commonly known as:  RISPERDAL Take 1 tablet (0.5 mg total) by mouth 2 (two) times daily.   rOPINIRole 1 MG tablet Commonly known as: REQUIP Take 1 tablet (1 mg total) by mouth 3 (three) times daily. What changed:  medication strength how much to take when to take this   Vitamin D 50 MCG (2000 UT) Caps Take 2,000 Units by mouth daily.        Follow-up Information     [facility] Follow up in 5 day(s).   Specialty: Geriatric Medicine Contact information: [address] [place] [address] [phone]         Morene Crocker, MD Follow up in 2 week(s).   Specialty: Neurology Contact information: [phone] [facility] [address] [facility] [place] [address] [phone]                Discharge Exam: Filed Weights   06/22/22 1616  Weight: 59 kg   Physical Exam HENT:     Head: Normocephalic.     Mouth/Throat:     Pharynx: No oropharyngeal exudate.  Eyes:     General: Lids are normal.     Conjunctiva/sclera: Conjunctivae normal.  Cardiovascular:     Rate and Rhythm: Normal rate and regular rhythm.     Heart sounds: Normal heart sounds, S1 normal and S2 normal.  Pulmonary:     Breath sounds: No decreased breath sounds, wheezing, rhonchi or rales.  Abdominal:     Palpations: Abdomen is soft.     Tenderness: There is no abdominal tenderness.  Musculoskeletal:     Right lower leg: No swelling.     Left lower leg: No swelling.  Skin:    General: Skin is warm.     Findings: No rash.  Neurological:     Mental Status: He is alert and oriented to person, place, and time.      Condition at discharge: stable  The results of significant diagnostics from this hospitalization (including imaging, microbiology, ancillary and laboratory) are listed below for reference.   Imaging Studies: CT HEAD WO CONTRAST ( )  Result Date: 06/22/2022 CLINICAL DATA:  Neurological deficit. EXAM: CT HEAD WITHOUT CONTRAST TECHNIQUE: Contiguous  axial images were obtained from the base of the skull through the vertex without intravenous contrast. RADIATION DOSE REDUCTION: This exam was performed according to the departmental dose-optimization program which includes automated exposure control, adjustment of the mA and/or kV according to patient size and/or use of iterative reconstruction technique. COMPARISON:  May 28, 2022 FINDINGS: Brain: There is mild cerebral atrophy with widening of the extra-axial spaces and ventricular dilatation. There are areas of decreased attenuation within the white matter tracts of the supratentorial brain, consistent with microvascular disease changes. Vascular: No hyperdense vessel or unexpected calcification. Skull: Normal. Negative for fracture or focal lesion. Sinuses/Orbits: No acute finding. Other: None. IMPRESSION: 1. No acute intracranial abnormality. 2. Generalized cerebral atrophy with chronic white matter small vessel ischemic changes. Electronically Signed   By: Aram Candela M.D.   On: 06/22/2022 22:11   DG Chest 2 View  Result Date: 06/22/2022 CLINICAL DATA:  Dizziness.  Mid back pain.  History of [name]'s. EXAM: CHEST - 2 VIEW COMPARISON:  AP chest 07/15/2020 FINDINGS: Cardiac silhouette and mediastinal contours are within normal limits. Mild calcification within the aortic arch. A coronary artery stent is again seen. The lungs are clear. No pleural effusion or pneumothorax. Mild-to-moderate multilevel degenerative disc changes of the thoracic spine. IMPRESSION: No active cardiopulmonary disease. Electronically Signed   By: Neita Garnet M.D.   On: 06/22/2022 17:01    Microbiology: Results for orders placed or performed during the hospital encounter of 06/11/22  Resp Panel by RT-PCR (Flu A&B, Covid) Anterior Nasal Swab     Status: None   Collection Time: 06/11/22 12:35 PM   Specimen: Anterior Nasal Swab  Result Value Ref Range Status   SARS Coronavirus 2 by RT PCR NEGATIVE NEGATIVE Final     Comment: (NOTE) SARS-CoV-2 target nucleic acids are NOT DETECTED.  The SARS-CoV-2 RNA is generally detectable in upper respiratory specimens during the acute phase of infection. The lowest concentration of SARS-CoV-2 viral copies this assay can detect is 138 copies/mL. A negative result does not preclude SARS-Cov-2 infection and should not be used as the sole basis for treatment or other patient management decisions. A negative result may occur with  improper specimen collection/handling, submission of specimen other than nasopharyngeal swab, presence of viral mutation(s) within the areas targeted by this assay, and inadequate number of viral copies(<138 copies/mL). A negative result must be combined with clinical observations, patient history, and epidemiological information. The expected result is Negative.  Fact Sheet for Patients:  BloggerCourse.com  Fact Sheet for Healthcare Providers:  SeriousBroker.it  This test is no t yet approved or cleared by the [place] and  has been authorized  for detection and/or diagnosis of SARS-CoV-2 by FDA under an Emergency Use Authorization (EUA). This EUA will remain  in effect (meaning this test can be used) for the duration of the COVID-19 declaration under Section 564(b)(1) of the Act, 21 U.S.C.section 360bbb-3(b)(1), unless the authorization is terminated  or revoked sooner.       Influenza A by PCR NEGATIVE NEGATIVE Final   Influenza B by PCR NEGATIVE NEGATIVE Final    Comment: (NOTE) The Xpert Xpress SARS-CoV-2/FLU/RSV plus assay is intended as an aid in the diagnosis of influenza from Nasopharyngeal swab specimens and should not be used as a sole basis for treatment. Nasal washings and aspirates are unacceptable for Xpert Xpress SARS-CoV-2/FLU/RSV testing.  Fact Sheet for Patients: BloggerCourse.com  Fact Sheet for Healthcare  Providers: SeriousBroker.it  This test is not yet approved or cleared by the [place] FDA and has been authorized for detection and/or diagnosis of SARS-CoV-2 by FDA under an Emergency Use Authorization (EUA). This EUA will remain in effect (meaning this test can be used) for the duration of the COVID-19 declaration under Section 564(b)(1) of the Act, 21 U.S.C. section 360bbb-3(b)(1), unless the authorization is terminated or revoked.  Performed at [facility], [address] [place], [place] [address]     Labs: CBC: Recent Labs  Lab 06/22/22 1619 06/23/22 0522  WBC 8.0 8.7  HGB 11.4* 10.1*  HCT 36.0* 31.5*  MCV 92.8 91.0  PLT 191 165   Basic Metabolic Panel: Recent Labs  Lab 06/22/22 1619 06/23/22 0522 06/26/22 0625 06/29/22 0459  NA 144 145 139  --   K 3.8 4.0 4.4  --   CL 108 111 103  --   CO2 28 27 30   --   GLUCOSE 104* 101* 91  --   BUN 17 23 26*  --   CREATININE 1.11 0.83 0.84 0.80  CALCIUM 8.6* 8.3* 9.0  --    Liver Function Tests: No results for input(s): "AST", "ALT", "ALKPHOS", "BILITOT", "PROT", "ALBUMIN" in the last 168 hours. CBG: No results for input(s): "GLUCAP" in the last 168 hours.  Discharge time spent: greater than 30 minutes.  Signed: , MD [facility] 06/29/2022

## 2022-06-29 NOTE — Progress Notes (Signed)
Mobility Specialist - Progress Note    06/29/22 1000  Mobility  Activity Ambulated with assistance in hallway;Stood at bedside;Dangled on edge of bed  Level of Assistance Minimal assist, patient does 75% or more  Assistive Device Front wheel walker  Distance Ambulated (ft) 480 ft  Activity Response Tolerated well  $Mobility charge 1 Mobility    Pt sitting in recliner upon arrival using RA. Denies lightlessness or dizziness this date, RN states BP has been stable this date. Pt completes STS CGA and ambulates 3 laps around NS MinA/CGA --- less dependent on RW during second and third lap, but author doesn't remove due to some jerkiness. Pt tolerates well and returns to bed with alarm set and brother present.  Clarisa Schools Mobility Specialist 06/29/22, 10:30 AM

## 2022-06-29 NOTE — Progress Notes (Signed)
Patient discharged to [facility], all IV's were removed, all belongings sent home with patient. Report called and given to [facility] from [place].

## 2022-07-07 ENCOUNTER — Emergency Department
Admission: EM | Admit: 2022-07-07 | Discharge: 2022-07-07 | Disposition: A | Discharge status: Home / Self Care | Payer: Medicare Other | Attending: Emergency Medicine | Admitting: Emergency Medicine

## 2022-07-07 ENCOUNTER — Emergency Department: Payer: Medicare Other

## 2022-07-07 ENCOUNTER — Other Ambulatory Visit: Payer: Self-pay

## 2022-07-07 DIAGNOSIS — I951 Orthostatic hypotension: Secondary | ICD-10-CM

## 2022-07-07 DIAGNOSIS — I959 Hypotension, unspecified: Secondary | ICD-10-CM | POA: Diagnosis not present

## 2022-07-07 DIAGNOSIS — D72829 Elevated white blood cell count, unspecified: Secondary | ICD-10-CM | POA: Insufficient documentation

## 2022-07-07 DIAGNOSIS — G20 Parkinson's disease: Secondary | ICD-10-CM | POA: Diagnosis not present

## 2022-07-07 DIAGNOSIS — R29898 Other symptoms and signs involving the musculoskeletal system: Secondary | ICD-10-CM

## 2022-07-07 DIAGNOSIS — R42 Dizziness and giddiness: Secondary | ICD-10-CM | POA: Diagnosis present

## 2022-07-07 LAB — URINALYSIS, ROUTINE W REFLEX MICROSCOPIC
Bilirubin Urine: NEGATIVE
Glucose, UA: NEGATIVE mg/dL
Hgb urine dipstick: NEGATIVE
Ketones, ur: 5 mg/dL — AB
Leukocytes,Ua: NEGATIVE
Nitrite: NEGATIVE
Protein, ur: NEGATIVE mg/dL
Specific Gravity, Urine: 1.016 (ref 1.005–1.030)
pH: 6 (ref 5.0–8.0)

## 2022-07-07 LAB — CBC WITH DIFFERENTIAL/PLATELET
Abs Immature Granulocytes: 0.07 10*3/uL (ref 0.00–0.07)
Basophils Absolute: 0 10*3/uL (ref 0.0–0.1)
Basophils Relative: 0 %
Eosinophils Absolute: 0.1 10*3/uL (ref 0.0–0.5)
Eosinophils Relative: 1 %
HCT: 34.1 % — ABNORMAL LOW (ref 39.0–52.0)
Hemoglobin: 10.8 g/dL — ABNORMAL LOW (ref 13.0–17.0)
Immature Granulocytes: 1 %
Lymphocytes Relative: 9 %
Lymphs Abs: 1 10*3/uL (ref 0.7–4.0)
MCH: 29.3 pg (ref 26.0–34.0)
MCHC: 31.7 g/dL (ref 30.0–36.0)
MCV: 92.4 fL (ref 80.0–100.0)
Monocytes Absolute: 0.6 10*3/uL (ref 0.1–1.0)
Monocytes Relative: 5 %
Neutro Abs: 10.1 10*3/uL — ABNORMAL HIGH (ref 1.7–7.7)
Neutrophils Relative %: 84 %
Platelets: 210 10*3/uL (ref 150–400)
RBC: 3.69 MIL/uL — ABNORMAL LOW (ref 4.22–5.81)
RDW: 13.3 % (ref 11.5–15.5)
WBC: 12 10*3/uL — ABNORMAL HIGH (ref 4.0–10.5)
nRBC: 0 % (ref 0.0–0.2)

## 2022-07-07 LAB — COMPREHENSIVE METABOLIC PANEL
ALT: <5 U/L (ref 0–44)
AST: 14 U/L — ABNORMAL LOW (ref 15–41)
Albumin: 3.3 g/dL — ABNORMAL LOW (ref 3.5–5.0)
Alkaline Phosphatase: 61 U/L (ref 38–126)
Anion gap: 7 (ref 5–15)
BUN: 21 mg/dL (ref 8–23)
CO2: 27 mmol/L (ref 22–32)
Calcium: 8.6 mg/dL — ABNORMAL LOW (ref 8.9–10.3)
Chloride: 104 mmol/L (ref 98–111)
Creatinine, Ser: 0.95 mg/dL (ref 0.61–1.24)
GFR, Estimated: >60 mL/min (ref >60)
Glucose, Bld: 115 mg/dL — ABNORMAL HIGH (ref 70–99)
Potassium: 4.1 mmol/L (ref 3.5–5.1)
Sodium: 138 mmol/L (ref 135–145)
Total Bilirubin: 0.6 mg/dL (ref 0.3–1.2)
Total Protein: 6.5 g/dL (ref 6.5–8.1)

## 2022-07-07 LAB — TROPONIN I (HIGH SENSITIVITY)
Troponin I (High Sensitivity): 6 ng/L (ref <18)
Troponin I (High Sensitivity): 5 ng/L (ref <18)

## 2022-07-07 MED ORDER — CARBIDOPA-LEVODOPA 25-250 MG PO TABS
1.5000 | ORAL_TABLET | Freq: Once | ORAL | Status: AC
  Administered 2022-07-07: 1.5 via ORAL
  Filled 2022-07-07: qty 2

## 2022-07-07 MED ORDER — CARBIDOPA-LEVODOPA 25-100 MG PO TABS: 1.5000 | ORAL_TABLET | Freq: Four times a day (QID) | ORAL | 2 refills | Status: DC

## 2022-07-07 NOTE — ED Provider Notes (Signed)
[facility] Provider Note   Event Date/Time   First MD Initiated Contact with Patient 07/07/22 1043     (approximate) History  Dizziness and Hypotension  HPI Corey Morrison is a 66 y.o. male past medical history of [name]'s who presents via EMS with complaints of bilateral lower extremity weakness as well as found to be hypotensive on EMS arrival to the 80s/50s.  Patient states that this is not uncommon for him as he normally takes midodrine for his hypotensive episodes however he is concerned as he believes they have made changes to his parkinsonian medication that has caused his bilateral lower extremity weakness.  Patient states that he was seen and discharged from our hospital on 06/23/2022 and has been becoming weaker since that time. ROS: Patient currently denies any vision changes, tinnitus, difficulty speaking, facial droop, sore throat, chest pain, shortness of breath, abdominal pain, nausea/vomiting/diarrhea, dysuria, or numbness/paresthesias in any extremity   Physical Exam  Triage Vital Signs: ED Triage Vitals  Enc Vitals Group     BP 07/07/22 1040 118/65     Pulse Rate 07/07/22 1040 85     Resp 07/07/22 1040 20     Temp 07/07/22 1040 (!) 97.5 F (36.4 C)     Temp Source 07/07/22 1040 Oral     SpO2 07/07/22 1040 100 %     Weight 07/07/22 1042 135 lb (61.2 kg)     Height 07/07/22 1042 5\' 9"  (1.753 m)     Head Circumference --      Peak Flow --      Pain Score 07/07/22 1042 7     Pain Loc --      Pain Edu? --      Excl. in GC? --    Most recent vital signs: Vitals:   07/07/22 1040  BP: 118/65  Pulse: 85  Resp: 20  Temp: (!) 97.5 F (36.4 C)  SpO2: 100%   General: Awake, oriented x4. CV:  Good peripheral perfusion.  Resp:  Normal effort.  Abd:  No distention.  Other:  Elderly Caucasian male laying in bed in no acute distress.  4/5 strength in bilateral lower extremities ED Results / Procedures / Treatments  Labs (all labs ordered are  listed, but only abnormal results are displayed) Labs Reviewed - No data to display EKG ED ECG REPORT I, 09/06/22, the attending physician, personally viewed and interpreted this ECG. Date: 07/07/2022 EKG Time: 1042 Rate: 68 Rhythm: normal sinus rhythm QRS Axis: normal Intervals: Left bundle branch block ST/T Wave abnormalities: normal Narrative Interpretation: Normal sinus rhythm with a bundle branch block.  No evidence of acute ischemia PROCEDURES: Critical Care performed: No Procedures MEDICATIONS ORDERED IN ED: Medications - No data to display IMPRESSION / MDM / ASSESSMENT AND PLAN / ED COURSE  I reviewed the triage vital signs and the nursing notes.                             Differential diagnosis includes, but is not limited to, CVA, TIA, intracranial hemorrhage, parkinsonian freezing The patient is on the cardiac monitor to evaluate for evidence of arrhythmia and/or significant heart rate changes. Patient's presentation is most consistent with acute presentation with potential threat to life or bodily function. Patient is a 66 year old male with a stated past medical history of [name]'s disease who presents for symptoms severe bilateral lower extremity weakness that has kept him from walking the  last 2 days.  Patient states that discharged from the hospital on 06/23/2022 with the start of his downhill trend weakness in his lower extremities.  Laboratory evaluation significant for slight leukocytosis to 12 months as well stable anemia with hemoglobin of 10.8 and hematocrit of 34.1.  Patient states that he had mild pneumonia at 3.3.  Patient's chest x-ray does not show any evidence of acute infection at this time.  UA shows no evidence of infection or hematuria.  Leukocytosis may be secondary to patient's quality today.   Consults: Neurology-I spoke to Dr. Amada Jupiter on-call for the office who recommended increasing patient's Sinemet immediate release to 1.5 tabs from 1 and  prompt follow-up. The patient has been reexamined and is ready to be discharged.  All diagnostic results have been reviewed and discussed with the patient/family.  Care plan has been outlined and the patient/family understands all current diagnoses, results, and treatment plans.  There are no new complaints, changes, or physical findings at this time.  All questions have been addressed and answered.  Patient was instructed to, and agrees to follow-up with their primary care physician as well as return to the emergency department if any new or worsening symptoms develop. Dispo: Discharge home with follow-up   FINAL CLINICAL IMPRESSION(S) / ED DIAGNOSES   Final diagnoses:  None   Rx / DC Orders   ED Discharge Orders     None      Note:  This document was prepared using Dragon voice recognition software and may include unintentional dictation errors.   Merwyn Katos, MD 07/07/22 [phone]

## 2022-07-07 NOTE — ED Notes (Signed)
Called for [facility] to transport patient back to [facility] waiting on arrival

## 2022-07-07 NOTE — ED Triage Notes (Signed)
Pt arrives with EMS with concern for ongoing dizziness and reports of hypotension today by facility. Pt has been feeling dizzy for the past few weeks. Hx of parkinsons

## 2022-07-08 ENCOUNTER — Other Ambulatory Visit: Payer: Self-pay

## 2022-07-08 ENCOUNTER — Emergency Department: Payer: Medicare Other

## 2022-07-08 ENCOUNTER — Emergency Department
Admission: EM | Admit: 2022-07-08 | Discharge: 2022-07-08 | Disposition: A | Discharge status: Home / Self Care | Payer: Medicare Other | Attending: Student in an Organized Health Care Education/Training Program | Admitting: Student in an Organized Health Care Education/Training Program

## 2022-07-08 DIAGNOSIS — R42 Dizziness and giddiness: Secondary | ICD-10-CM | POA: Insufficient documentation

## 2022-07-08 DIAGNOSIS — G20 Parkinson's disease: Secondary | ICD-10-CM | POA: Insufficient documentation

## 2022-07-08 DIAGNOSIS — R55 Syncope and collapse: Secondary | ICD-10-CM | POA: Diagnosis present

## 2022-07-08 LAB — COMPREHENSIVE METABOLIC PANEL
ALT: 10 U/L (ref 0–44)
AST: 12 U/L — ABNORMAL LOW (ref 15–41)
Albumin: 3.5 g/dL (ref 3.5–5.0)
Alkaline Phosphatase: 66 U/L (ref 38–126)
Anion gap: 8 (ref 5–15)
BUN: 27 mg/dL — ABNORMAL HIGH (ref 8–23)
CO2: 26 mmol/L (ref 22–32)
Calcium: 8.8 mg/dL — ABNORMAL LOW (ref 8.9–10.3)
Chloride: 106 mmol/L (ref 98–111)
Creatinine, Ser: 1.03 mg/dL (ref 0.61–1.24)
GFR, Estimated: >60 mL/min (ref >60)
Glucose, Bld: 101 mg/dL — ABNORMAL HIGH (ref 70–99)
Potassium: 4.6 mmol/L (ref 3.5–5.1)
Sodium: 140 mmol/L (ref 135–145)
Total Bilirubin: 0.4 mg/dL (ref 0.3–1.2)
Total Protein: 6.8 g/dL (ref 6.5–8.1)

## 2022-07-08 LAB — CBC
HCT: 35.3 % — ABNORMAL LOW (ref 39.0–52.0)
Hemoglobin: 11.2 g/dL — ABNORMAL LOW (ref 13.0–17.0)
MCH: 29.4 pg (ref 26.0–34.0)
MCHC: 31.7 g/dL (ref 30.0–36.0)
MCV: 92.7 fL (ref 80.0–100.0)
Platelets: 227 10*3/uL (ref 150–400)
RBC: 3.81 MIL/uL — ABNORMAL LOW (ref 4.22–5.81)
RDW: 13.5 % (ref 11.5–15.5)
WBC: 11.6 10*3/uL — ABNORMAL HIGH (ref 4.0–10.5)
nRBC: 0 % (ref 0.0–0.2)

## 2022-07-08 LAB — URINALYSIS, ROUTINE W REFLEX MICROSCOPIC
Bacteria, UA: NONE SEEN
Bilirubin Urine: NEGATIVE
Glucose, UA: NEGATIVE mg/dL
Ketones, ur: NEGATIVE mg/dL
Leukocytes,Ua: NEGATIVE
Nitrite: NEGATIVE
Protein, ur: NEGATIVE mg/dL
Specific Gravity, Urine: 1.01 (ref 1.005–1.030)
Squamous Epithelial / HPF: NONE SEEN (ref 0–5)
pH: 6 (ref 5.0–8.0)

## 2022-07-08 LAB — TROPONIN I (HIGH SENSITIVITY)
Troponin I (High Sensitivity): 6 ng/L (ref <18)
Troponin I (High Sensitivity): 7 ng/L (ref <18)

## 2022-07-08 MED ORDER — ENTACAPONE 200 MG PO TABS
200.0000 mg | ORAL_TABLET | Freq: Once | ORAL | Status: AC
Start: 2022-07-08 — End: 2022-07-08
  Administered 2022-07-08: 200 mg via ORAL
  Filled 2022-07-08: qty 1

## 2022-07-08 MED ORDER — CARBIDOPA-LEVODOPA 25-100 MG PO TABS
1.0000 | ORAL_TABLET | Freq: Three times a day (TID) | ORAL | Status: DC
Start: 2022-07-08 — End: 2022-07-09
  Administered 2022-07-08: 1 via ORAL
  Filled 2022-07-08: qty 1

## 2022-07-08 NOTE — ED Notes (Signed)
Pt given snack per request and changed into paper scrubs and new brief.

## 2022-07-08 NOTE — ED Triage Notes (Signed)
Patient arrived by [facility] from [facility]. C/o near syncope. Dizziness upon standing. Reports was seen here yesterday.   EMS vitals: 120/80 blood pressure. EMS reports orthostatic changes 90/52 standing   EMS administered 350 fluid  HX A-fib

## 2022-07-08 NOTE — ED Provider Triage Note (Signed)
Emergency Medicine Provider Triage Evaluation Note  Corey Morrison , a 66 y.o. male  was evaluated in triage.  Pt complains of dizziness and BP dropping upon standing. Evaluated here yesterday for the same and states his medications were adjusted. No chest pain , shortness of breath today.Marland Kitchen  Physical Exam  There were no vitals taken for this visit. Gen:   Awake, no distress   Resp:  Normal effort  MSK:   Moves extremities without difficulty  Other:    Medical Decision Making  Medically screening exam initiated at 11:19 AM.  Appropriate orders placed.  Corey Morrison was informed that the remainder of the evaluation will be completed by another provider, this initial triage assessment does not replace that evaluation, and the importance of remaining in the ED until their evaluation is complete.    Chinita Pester, FNP 07/08/22 1120

## 2022-07-08 NOTE — ED Notes (Signed)
Pt assisted with phone to call for ride home.

## 2022-07-08 NOTE — ED Notes (Addendum)
Pt given additional snack per his request and updated on plan of care.

## 2022-07-08 NOTE — ED Notes (Signed)
[facility] called to transport pt home  

## 2022-07-08 NOTE — ED Notes (Signed)
Pt given urinal and instructed on use for sample 

## 2022-07-08 NOTE — ED Triage Notes (Signed)
Pt arrives via EMS from [facility] for an episode where he got dizzy and fainted- pt was orthostatic positive for EMS- pt states he was seen here yesterday for same

## 2022-07-08 NOTE — ED Notes (Signed)
Pt states he cannot find a ride home, and does not have money for cab. Pt requests transport home via EMS. Cabs not running at this time. Consulting civil engineer notified.

## 2022-07-08 NOTE — ED Provider Notes (Signed)
[facility] Provider Note    Event Date/Time   First MD Initiated Contact with Patient 07/08/22 1810     (approximate)   History   Loss of Consciousness   HPI  Corey Morrison is a 66 y.o. male history of [name] disease presents to the ER for evaluation of lightheadedness and near fainting spell that occurred earlier today.  Did not hit his head.   Felt weak and his blood pressure dropped.  Had this happen several times before with orthostasis.  Patient given bolus of IV fluids with improvement.  Denies any pain or discomfort.   Physical Exam   Triage Vital Signs: ED Triage Vitals [07/08/22 1119]  Enc Vitals Group     BP 136/79     Pulse Rate 86     Resp 18     Temp 98 F (36.7 C)     Temp Source Oral     SpO2 100 %     Weight      Height      Head Circumference      Peak Flow      Pain Score 0     Pain Loc      Pain Edu?      Excl. in GC?     Most recent vital signs: Vitals:   07/08/22 2020 07/08/22 2125  BP:    Pulse: 89   Resp: 18   Temp:  98 F (36.7 C)  SpO2: 100%      Constitutional: Alert  Eyes: Conjunctivae are normal.  Head: Atraumatic. Nose: No congestion/rhinnorhea. Mouth/Throat: Mucous membranes are moist.   Neck: Painless ROM.  Cardiovascular:   Good peripheral circulation. Respiratory: Normal respiratory effort.  No retractions.  Gastrointestinal: Soft and nontender.  Musculoskeletal:  no deformity Neurologic:  MAE spontaneously. Shuffling gait Skin:  Skin is warm, dry and intact. No rash noted. Psychiatric: Mood and affect are normal. Speech and behavior are normal.    ED Results / Procedures / Treatments   Labs (all labs ordered are listed, but only abnormal results are displayed) Labs Reviewed  URINALYSIS, ROUTINE W REFLEX MICROSCOPIC - Abnormal; Notable for the following components:      Result Value   Color, Urine YELLOW (*)    APPearance CLEAR (*)    Hgb urine dipstick SMALL (*)    All other  components within normal limits  COMPREHENSIVE METABOLIC PANEL - Abnormal; Notable for the following components:   Glucose, Bld 101 (*)    BUN 27 (*)    Calcium 8.8 (*)    AST 12 (*)    All other components within normal limits  CBC - Abnormal; Notable for the following components:   WBC 11.6 (*)    RBC 3.81 (*)    Hemoglobin 11.2 (*)    HCT 35.3 (*)    All other components within normal limits  TROPONIN I (HIGH SENSITIVITY)  TROPONIN I (HIGH SENSITIVITY)     EKG   ED ECG REPORT I, Willy Eddy, the attending physician, personally viewed and interpreted this ECG.   Date: 07/08/2022  EKG Time: 11:30  Rate: 80  Rhythm: sinus  Axis: left  Intervals:lbbb  ST&T Change: non stemi, no sgarbossa   RADIOLOGY Please see ED Course for my review and interpretation.  I personally reviewed all radiographic images ordered to evaluate for the above acute complaints and reviewed radiology reports and findings.  These findings were personally discussed with the patient.  Please see medical  record for radiology report.    PROCEDURES:  Critical Care performed:   Procedures   MEDICATIONS ORDERED IN ED: Medications  carbidopa-levodopa (SINEMET IR) 25-100 MG per tablet immediate release 1 tablet (1 tablet Oral Given 07/08/22 2045)  entacapone (COMTAN) tablet 200 mg (200 mg Oral Given 07/08/22 2045)     IMPRESSION / MDM / ASSESSMENT AND PLAN / ED COURSE  I reviewed the triage vital signs and the nursing notes.                              Differential diagnosis includes, but is not limited to, hydration, orthostasis, dysrhythmia, medication, autonomic dysfunction  Presented to the ER for evaluation of symptoms as described above.  Patient clinically in no acute distress.  Has had similar symptoms happen multiple times in the past.  Patient's symptoms improved with IV fluids.  Will observe on monitor.  He is normotensive denies any pain EKG is nonischemic initial troponin  negative.  No focal neurodeficits has had some recent medication changes.   Clinical Course as of 07/08/22 2137  Fri Jul 08, 2022  2014 CBC [PR]  2133 Patient reassessed.  He is ambulating feels improved.  He is requesting discharge home.  Given reassuring work-up I think that is reasonable. [PR]    Clinical Course User Index [PR] Willy Eddy, MD      FINAL CLINICAL IMPRESSION(S) / ED DIAGNOSES   Final diagnoses:  Syncope, unspecified syncope type     Rx / DC Orders   ED Discharge Orders     None        Note:  This document was prepared using Dragon voice recognition software and may include unintentional dictation errors.    Willy Eddy, MD 07/08/22 2137

## 2022-07-19 ENCOUNTER — Other Ambulatory Visit (HOSPITAL_COMMUNITY): Payer: Self-pay | Admitting: Psychiatry

## 2022-07-21 ENCOUNTER — Inpatient Hospital Stay
Admission: EM | Admit: 2022-07-21 | Discharge: 2022-07-28 | DRG: 057 | Disposition: A | Discharge status: Skilled Nursing Facility | Payer: Medicare Other | Attending: Internal Medicine | Admitting: Internal Medicine

## 2022-07-21 ENCOUNTER — Other Ambulatory Visit: Payer: Self-pay

## 2022-07-21 ENCOUNTER — Emergency Department: Payer: Medicare Other

## 2022-07-21 DIAGNOSIS — Z79899 Other long term (current) drug therapy: Secondary | ICD-10-CM

## 2022-07-21 DIAGNOSIS — I951 Orthostatic hypotension: Secondary | ICD-10-CM | POA: Diagnosis present

## 2022-07-21 DIAGNOSIS — F32A Depression, unspecified: Secondary | ICD-10-CM

## 2022-07-21 DIAGNOSIS — Z87891 Personal history of nicotine dependence: Secondary | ICD-10-CM

## 2022-07-21 DIAGNOSIS — Z20822 Contact with and (suspected) exposure to covid-19: Secondary | ICD-10-CM | POA: Diagnosis present

## 2022-07-21 DIAGNOSIS — R32 Unspecified urinary incontinence: Secondary | ICD-10-CM | POA: Diagnosis present

## 2022-07-21 DIAGNOSIS — H9190 Unspecified hearing loss, unspecified ear: Secondary | ICD-10-CM | POA: Diagnosis present

## 2022-07-21 DIAGNOSIS — G249 Dystonia, unspecified: Secondary | ICD-10-CM | POA: Diagnosis present

## 2022-07-21 DIAGNOSIS — G20 Parkinson's disease: Principal | ICD-10-CM | POA: Diagnosis present

## 2022-07-21 DIAGNOSIS — G629 Polyneuropathy, unspecified: Secondary | ICD-10-CM

## 2022-07-21 DIAGNOSIS — Z66 Do not resuscitate: Secondary | ICD-10-CM | POA: Diagnosis present

## 2022-07-21 DIAGNOSIS — I480 Paroxysmal atrial fibrillation: Secondary | ICD-10-CM | POA: Diagnosis present

## 2022-07-21 DIAGNOSIS — F419 Anxiety disorder, unspecified: Secondary | ICD-10-CM | POA: Diagnosis present

## 2022-07-21 DIAGNOSIS — K219 Gastro-esophageal reflux disease without esophagitis: Secondary | ICD-10-CM

## 2022-07-21 DIAGNOSIS — Z7982 Long term (current) use of aspirin: Secondary | ICD-10-CM

## 2022-07-21 DIAGNOSIS — R400 Somnolence: Secondary | ICD-10-CM | POA: Diagnosis present

## 2022-07-21 DIAGNOSIS — E785 Hyperlipidemia, unspecified: Secondary | ICD-10-CM | POA: Diagnosis not present

## 2022-07-21 DIAGNOSIS — G20A1 Parkinson's disease without dyskinesia, without mention of fluctuations: Secondary | ICD-10-CM | POA: Diagnosis present

## 2022-07-21 DIAGNOSIS — Z515 Encounter for palliative care: Secondary | ICD-10-CM

## 2022-07-21 DIAGNOSIS — I2510 Atherosclerotic heart disease of native coronary artery without angina pectoris: Secondary | ICD-10-CM | POA: Diagnosis present

## 2022-07-21 DIAGNOSIS — R296 Repeated falls: Secondary | ICD-10-CM | POA: Diagnosis present

## 2022-07-21 DIAGNOSIS — R262 Difficulty in walking, not elsewhere classified: Secondary | ICD-10-CM | POA: Diagnosis present

## 2022-07-21 DIAGNOSIS — R441 Visual hallucinations: Secondary | ICD-10-CM | POA: Diagnosis not present

## 2022-07-21 DIAGNOSIS — E538 Deficiency of other specified B group vitamins: Secondary | ICD-10-CM | POA: Diagnosis present

## 2022-07-21 DIAGNOSIS — R443 Hallucinations, unspecified: Secondary | ICD-10-CM | POA: Diagnosis present

## 2022-07-21 DIAGNOSIS — F05 Delirium due to known physiological condition: Secondary | ICD-10-CM | POA: Diagnosis not present

## 2022-07-21 DIAGNOSIS — I517 Cardiomegaly: Secondary | ICD-10-CM | POA: Diagnosis present

## 2022-07-21 LAB — URINALYSIS, ROUTINE W REFLEX MICROSCOPIC
Glucose, UA: NEGATIVE mg/dL
Hgb urine dipstick: NEGATIVE
Ketones, ur: 5 mg/dL — AB
Leukocytes,Ua: NEGATIVE
Nitrite: NEGATIVE
Protein, ur: NEGATIVE mg/dL
Specific Gravity, Urine: 1.025 (ref 1.005–1.030)
pH: 5 (ref 5.0–8.0)

## 2022-07-21 LAB — CBC
HCT: 34.9 % — ABNORMAL LOW (ref 39.0–52.0)
Hemoglobin: 10.9 g/dL — ABNORMAL LOW (ref 13.0–17.0)
MCH: 28.7 pg (ref 26.0–34.0)
MCHC: 31.2 g/dL (ref 30.0–36.0)
MCV: 91.8 fL (ref 80.0–100.0)
Platelets: 208 10*3/uL (ref 150–400)
RBC: 3.8 MIL/uL — ABNORMAL LOW (ref 4.22–5.81)
RDW: 13.2 % (ref 11.5–15.5)
WBC: 10.3 10*3/uL (ref 4.0–10.5)
nRBC: 0 % (ref 0.0–0.2)

## 2022-07-21 LAB — BASIC METABOLIC PANEL
Anion gap: 6 (ref 5–15)
BUN: 29 mg/dL — ABNORMAL HIGH (ref 8–23)
CO2: 29 mmol/L (ref 22–32)
Calcium: 9 mg/dL (ref 8.9–10.3)
Chloride: 104 mmol/L (ref 98–111)
Creatinine, Ser: 1.15 mg/dL (ref 0.61–1.24)
GFR, Estimated: >60 mL/min (ref >60)
Glucose, Bld: 128 mg/dL — ABNORMAL HIGH (ref 70–99)
Potassium: 4.2 mmol/L (ref 3.5–5.1)
Sodium: 139 mmol/L (ref 135–145)

## 2022-07-21 MED ORDER — CARBIDOPA-LEVODOPA ER 25-100 MG PO TBCR
1.0000 | EXTENDED_RELEASE_TABLET | Freq: Every day | ORAL | Status: DC
  Administered 2022-07-22 – 2022-07-24 (×4): 1 via ORAL
  Filled 2022-07-21 (×5): qty 1

## 2022-07-21 MED ORDER — ONDANSETRON HCL 4 MG PO TABS: 4.0000 mg | ORAL_TABLET | Freq: Four times a day (QID) | ORAL | Status: DC | PRN

## 2022-07-21 MED ORDER — PANTOPRAZOLE SODIUM 40 MG PO TBEC
40.0000 mg | DELAYED_RELEASE_TABLET | Freq: Every day | ORAL | Status: DC
  Administered 2022-07-22 – 2022-07-28 (×7): 40 mg via ORAL
  Filled 2022-07-21 (×7): qty 1

## 2022-07-21 MED ORDER — MIDODRINE HCL 5 MG PO TABS
5.0000 mg | ORAL_TABLET | Freq: Three times a day (TID) | ORAL | Status: DC
  Administered 2022-07-22 – 2022-07-25 (×11): 5 mg via ORAL
  Filled 2022-07-21 (×11): qty 1

## 2022-07-21 MED ORDER — TRAZODONE HCL 50 MG PO TABS
25.0000 mg | ORAL_TABLET | Freq: Every evening | ORAL | Status: DC | PRN
  Administered 2022-07-22 (×2): 25 mg via ORAL
  Filled 2022-07-21 (×2): qty 1

## 2022-07-21 MED ORDER — ROPINIROLE HCL 1 MG PO TABS
1.0000 mg | ORAL_TABLET | Freq: Three times a day (TID) | ORAL | Status: DC
  Administered 2022-07-22 – 2022-07-25 (×11): 1 mg via ORAL
  Filled 2022-07-21 (×12): qty 1

## 2022-07-21 MED ORDER — CITALOPRAM HYDROBROMIDE 20 MG PO TABS
40.0000 mg | ORAL_TABLET | Freq: Every day | ORAL | Status: DC
Start: 2022-07-22 — End: 2022-07-26
  Administered 2022-07-22 – 2022-07-26 (×5): 40 mg via ORAL
  Filled 2022-07-21 (×5): qty 2

## 2022-07-21 MED ORDER — EZETIMIBE 10 MG PO TABS
10.0000 mg | ORAL_TABLET | Freq: Every day | ORAL | Status: DC
  Administered 2022-07-22 – 2022-07-28 (×7): 10 mg via ORAL
  Filled 2022-07-21 (×7): qty 1

## 2022-07-21 MED ORDER — CARBIDOPA-LEVODOPA 25-100 MG PO TABS
1.5000 | ORAL_TABLET | Freq: Four times a day (QID) | ORAL | Status: DC
  Administered 2022-07-22 (×4): 1.5 via ORAL
  Filled 2022-07-21: qty 2
  Filled 2022-07-21: qty 1.5
  Filled 2022-07-21 (×2): qty 2

## 2022-07-21 MED ORDER — ACETAMINOPHEN 650 MG RE SUPP: 650.0000 mg | Freq: Four times a day (QID) | RECTAL | Status: DC | PRN

## 2022-07-21 MED ORDER — VITAMIN D 25 MCG (1000 UNIT) PO TABS
2000.0000 [IU] | ORAL_TABLET | Freq: Every day | ORAL | Status: DC
Start: 2022-07-22 — End: 2022-07-28
  Administered 2022-07-22 – 2022-07-28 (×7): 2000 [IU] via ORAL
  Filled 2022-07-21 (×7): qty 2

## 2022-07-21 MED ORDER — MELATONIN 5 MG PO TABS
5.0000 mg | ORAL_TABLET | Freq: Every day | ORAL | Status: DC
  Administered 2022-07-22: 5 mg via ORAL
  Filled 2022-07-21: qty 1

## 2022-07-21 MED ORDER — ENOXAPARIN SODIUM 40 MG/0.4ML IJ SOSY
40.0000 mg | PREFILLED_SYRINGE | INTRAMUSCULAR | Status: DC
  Administered 2022-07-22 – 2022-07-28 (×7): 40 mg via SUBCUTANEOUS
  Filled 2022-07-21 (×7): qty 0.4

## 2022-07-21 MED ORDER — SODIUM CHLORIDE 0.9 % IV SOLN: INTRAVENOUS | Status: DC

## 2022-07-21 MED ORDER — ONDANSETRON HCL 4 MG/2ML IJ SOLN: 4.0000 mg | Freq: Four times a day (QID) | INTRAMUSCULAR | Status: DC | PRN

## 2022-07-21 MED ORDER — MAGNESIUM HYDROXIDE 400 MG/5ML PO SUSP
30.0000 mL | Freq: Every day | ORAL | Status: DC | PRN
  Administered 2022-07-23 – 2022-07-25 (×3): 30 mL via ORAL
  Filled 2022-07-21 (×3): qty 30

## 2022-07-21 MED ORDER — ASPIRIN 81 MG PO TBEC
81.0000 mg | DELAYED_RELEASE_TABLET | Freq: Every day | ORAL | Status: DC
  Administered 2022-07-22 – 2022-07-28 (×7): 81 mg via ORAL
  Filled 2022-07-21 (×7): qty 1

## 2022-07-21 MED ORDER — RISPERIDONE 0.5 MG PO TABS
0.5000 mg | ORAL_TABLET | Freq: Two times a day (BID) | ORAL | Status: DC
  Administered 2022-07-22 – 2022-07-28 (×13): 0.5 mg via ORAL
  Filled 2022-07-21 (×13): qty 1

## 2022-07-21 MED ORDER — POLYETHYLENE GLYCOL 3350 17 G PO PACK
17.0000 g | PACK | Freq: Every day | ORAL | Status: DC | PRN
  Administered 2022-07-23 – 2022-07-26 (×4): 17 g via ORAL
  Filled 2022-07-21 (×4): qty 1

## 2022-07-21 MED ORDER — FLUTICASONE PROPIONATE 50 MCG/ACT NA SUSP
1.0000 | Freq: Every day | NASAL | Status: DC
  Administered 2022-07-22 – 2022-07-28 (×6): 1 via NASAL
  Filled 2022-07-21: qty 16

## 2022-07-21 MED ORDER — PSYLLIUM 95 % PO PACK
1.0000 | PACK | Freq: Every day | ORAL | Status: DC
  Administered 2022-07-22 – 2022-07-26 (×2): 1 via ORAL
  Filled 2022-07-21 (×6): qty 1

## 2022-07-21 MED ORDER — VITAMIN B-12 1000 MCG PO TABS
1000.0000 ug | ORAL_TABLET | Freq: Every day | ORAL | Status: DC
  Administered 2022-07-22 – 2022-07-28 (×7): 1000 ug via ORAL
  Filled 2022-07-21 (×7): qty 1

## 2022-07-21 MED ORDER — ENTACAPONE 200 MG PO TABS
200.0000 mg | ORAL_TABLET | Freq: Every day | ORAL | Status: DC
  Administered 2022-07-22 – 2022-07-25 (×19): 200 mg via ORAL
  Filled 2022-07-21 (×21): qty 1

## 2022-07-21 MED ORDER — ACETAMINOPHEN 325 MG PO TABS
650.0000 mg | ORAL_TABLET | Freq: Four times a day (QID) | ORAL | Status: DC | PRN
  Administered 2022-07-22 – 2022-07-24 (×4): 650 mg via ORAL
  Filled 2022-07-21 (×5): qty 2

## 2022-07-21 MED ORDER — SERTRALINE HCL 50 MG PO TABS
100.0000 mg | ORAL_TABLET | Freq: Every day | ORAL | Status: DC
  Administered 2022-07-22 – 2022-07-28 (×7): 100 mg via ORAL
  Filled 2022-07-21 (×7): qty 2

## 2022-07-21 MED ORDER — CAPSAICIN 0.025 % EX CREA
1.0000 | TOPICAL_CREAM | Freq: Two times a day (BID) | CUTANEOUS | Status: DC
Start: 2022-07-22 — End: 2022-07-28
  Administered 2022-07-22 – 2022-07-28 (×12): 1 via TOPICAL
  Filled 2022-07-21: qty 60

## 2022-07-21 NOTE — Assessment & Plan Note (Signed)
-   We will continue PPI therapy 

## 2022-07-21 NOTE — ED Triage Notes (Signed)
Pt coming [facility] For Bilat LE weakness, pt has parkinson's and stating that over the course of the last week they have developed increasing weakness.

## 2022-07-21 NOTE — Assessment & Plan Note (Addendum)
Stopped Celexa, continue Zoloft

## 2022-07-21 NOTE — Assessment & Plan Note (Addendum)
-   The patient has been having progressive bilateral lower extremity weakness. - This could be related deteriorating [name]'s disease. - The patient will be admitted to a medical bed. - PT consult will be obtained. - Neurology consult will be obtained.  Will defer further radiographic evaluation and medications adjustments to neurology. - I notified Dr. Otelia Limes about the patient.

## 2022-07-21 NOTE — ED Provider Notes (Signed)
[facility] Provider Note    Event Date/Time   First MD Initiated Contact with Patient 07/21/22 2116     (approximate)   History   Weakness   HPI  Corey Morrison is a 66 y.o. male  who presents to the emergency department today because of concern for increased weakness. The patient has history of Parkisons.  Patient has noticed that over the past week he has had increasing weakness.  Primarily in his lower extremities.  It is making it difficult for him to walk.  He has also noticed some dysuria and bad odor to his urine.  He denies any fevers or chills.  Says that recently he has been working with his neurologist and there have been some changes in the dosing of his parkinsons medication.   Physical Exam   Triage Vital Signs: ED Triage Vitals  Enc Vitals Group     BP 07/21/22 1750 91/63     Pulse Rate 07/21/22 1750 67     Resp 07/21/22 1750 18     Temp 07/21/22 1750 (!) 97.3 F (36.3 C)     Temp src --      SpO2 07/21/22 1750 97 %     Weight 07/21/22 1751 140 lb (63.5 kg)     Height --      Head Circumference --      Peak Flow --      Pain Score 07/21/22 1751 7     Pain Loc --      Pain Edu? --      Excl. in GC? --     Most recent vital signs: Vitals:   07/21/22 1750  BP: 91/63  Pulse: 67  Resp: 18  Temp: (!) 97.3 F (36.3 C)  SpO2: 97%    General: Awake, alert, oriented. CV:  Good peripheral perfusion. Regular rate and rhythm. Resp:  Normal effort. Lungs clear. Abd:  No distention.     ED Results / Procedures / Treatments   Labs (all labs ordered are listed, but only abnormal results are displayed) Labs Reviewed  BASIC METABOLIC PANEL - Abnormal; Notable for the following components:      Result Value   Glucose, Bld 128 (*)    BUN 29 (*)    All other components within normal limits  CBC - Abnormal; Notable for the following components:   RBC 3.80 (*)    Hemoglobin 10.9 (*)    HCT 34.9 (*)    All other components within  normal limits  URINALYSIS, ROUTINE W REFLEX MICROSCOPIC  CBG MONITORING, ED     EKG  I, Phineas Semen, attending physician, personally viewed and interpreted this EKG  EKG Time: 1754 Rate: 68 Rhythm: normal sinus rhythm Axis: rightward axis Intervals: qtc 497 QRS: LVH ST changes: no st elevation Impression: abnormal ekg   PROCEDURES:  Critical Care performed: No  Procedures   MEDICATIONS ORDERED IN ED: Medications - No data to display   IMPRESSION / MDM / ASSESSMENT AND PLAN / ED COURSE  I reviewed the triage vital signs and the nursing notes.                              Differential diagnosis includes, but is not limited to, progression of parkisons, infection, anemia.  Patient's presentation is most consistent with acute presentation with potential threat to life or bodily function.  Patient presented to the emergency department today with  concerns for increased weakness and difficulty with ambulation.  On exam patient is awake alert and oriented.  The patient blood without concerning anemia or electrolyte abnormality.  Urine without signs of infection.  This time however given profound weakness and difficulty with ambulation I do feel he would benefit from further work-up and evaluation.  Discussed with Dr. Arville Care with the hospitalist service who will plan on admission.  FINAL CLINICAL IMPRESSION(S) / ED DIAGNOSES   Final diagnoses:  Ambulatory dysfunction     Note:  This document was prepared using Dragon voice recognition software and may include unintentional dictation errors.    Phineas Semen, MD 07/21/22 [phone]

## 2022-07-21 NOTE — Assessment & Plan Note (Addendum)
Neurology seen and appreciate the recommendation.  Continue Sinemet IR and Entacapone. Stopped Sinemet ER and Requip PT and OT recommends SNF and TOC working on placement.  

## 2022-07-21 NOTE — H&P (Addendum)
[place]   PATIENT NAME: Corey Morrison    MR#:  [id number]  DATE OF BIRTH:  [birth year]  DATE OF ADMISSION:  07/21/2022  PRIMARY CARE PHYSICIAN: [facility]   Patient is coming from: Assisted living facility.  REQUESTING/REFERRING PHYSICIAN: Nance Pear, MD  CHIEF COMPLAINT:   Chief Complaint  Patient presents with   Weakness    HISTORY OF PRESENT ILLNESS:  Corey Morrison is a 66 y.o. male with medical history significant for [name]'s disease, coronary artery disease, dyslipidemia, anxiety, vitamin B12 deficiency and atrial fibrillation, who presented to the emergency room with acute onset of worsening generalized weakness specifically in both lower extremities with subsequent significant difficulty with ambulation.  He admits to associated paresthesias in both lower extremities and recent worsening urinary incontinence without stool incontinence.  He has associated mild dysarthria and dysphagia as well.  No facial droop.  He admits to mild bilateral upper extremity weakness.  He stated that he can take in 45 minutes to put on his shoes.  No fever or chills.  He had mild dysuria without urinary frequency or urgency or hematuria or flank pain.  No cough or wheezing or dyspnea.  ED Course: When he came to the ER BP was 91/63 with temperature 97.3 and otherwise normal vital signs.  BP later improved to 155/85 and has been as high as 172/95.  Labs revealed a BUN of 29 and a blood glucose of 128.  CBC showed anemia close to his baseline.  Urinalysis was remarkable for 5 ketones. EKG as reviewed by me : EKG showed normal sinus rhythm with a rate of 68 with right axis deviation, left ventricular hypertrophy with QRS widening and repolarization abnormality. Imaging: Portable chest x-ray showed no acute cardiopulmonary disease.  The patient will be admitted to a medical observation bed for further evaluation and management. PAST MEDICAL HISTORY:   Past Medical  History:  Diagnosis Date   A-fib ([place])    Anxiety disorder    Coronary artery disease    Hyperlipidemia    [name] disease (HCC)    Vitamin B12 deficiency     PAST SURGICAL HISTORY:   Past Surgical History:  Procedure Laterality Date   CARDIAC CATHETERIZATION      SOCIAL HISTORY:   Social History   Tobacco Use   Smoking status: Former   Smokeless tobacco: Never  Substance Use Topics   Alcohol use: Never    FAMILY HISTORY:   Family History  Problem Relation Age of Onset   Heart Problems Father     DRUG ALLERGIES:  No Known Allergies  REVIEW OF SYSTEMS:   ROS As per history of present illness. All pertinent systems were reviewed above. Constitutional, HEENT, cardiovascular, respiratory, GI, GU, musculoskeletal, neuro, psychiatric, endocrine, integumentary and hematologic systems were reviewed and are otherwise negative/unremarkable except for positive findings mentioned above in the HPI.   MEDICATIONS AT HOME:   Prior to Admission medications   Medication Sig Start Date End Date Taking? Authorizing Provider  aspirin 81 MG EC tablet Take 81 mg by mouth daily.   Yes [provider]  BIOFREEZE 4 % GEL SMARTSIG:sparingly Topical 3 Times Daily 07/14/22  Yes [provider]  capsicum (ZOSTRIX) 0.075 % topical cream Apply 1 Application topically 2 (two) times daily. (Apply to feet and thighs)   Yes [provider]  carbidopa-levodopa (SINEMET IR) 25-100 MG tablet Take 1.5 tablets by mouth 4 (four) times daily. At 7am, 10am, 1pm, 5pm 07/07/22  Yes Merwyn Katos, MD  Carbidopa-Levodopa ER (SINEMET CR) 25-100 MG tablet controlled release Take 1 tablet by mouth at bedtime. At 9:00 PM. 06/28/22  Yes Pennie Banter, DO  Cholecalciferol (VITAMIN D) 50 MCG (2000 UT) CAPS Take 2,000 Units by mouth daily.   Yes [provider]  citalopram (CELEXA) 40 MG tablet Take 1 tablet (40 mg total) by mouth daily. 06/29/22  Yes Esaw Grandchild A, DO   entacapone (COMTAN) 200 MG tablet Take 1 tablet (200 mg total) by mouth 5 (five) times daily. Give WITH carbidopa-levodopa at 7am, 10am, 1pm, 5pm, 9pm 06/28/22  Yes Esaw Grandchild A, DO  esomeprazole (NEXIUM) 20 MG capsule Take 20 mg by mouth daily.   Yes [provider]  ezetimibe (ZETIA) 10 MG tablet Take 10 mg by mouth daily.   Yes [provider]  fluticasone (FLONASE) 50 MCG/ACT nasal spray Place 1 spray into both nostrils daily.   Yes [provider]  melatonin 5 MG TABS Take 1 tablet (5 mg total) by mouth at bedtime. 06/28/22  Yes Esaw Grandchild A, DO  midodrine (PROAMATINE) 5 MG tablet Take 1 tablet (5 mg total) by mouth 3 (three) times daily with meals. 06/28/22  Yes Esaw Grandchild A, DO  psyllium (REGULOID) 0.52 g capsule Take 0.52 g by mouth at bedtime.   Yes [provider]  rOPINIRole (REQUIP) 1 MG tablet Take 1 tablet (1 mg total) by mouth 3 (three) times daily. 06/28/22  Yes Esaw Grandchild A, DO  sertraline (ZOLOFT) 100 MG tablet Take 100 mg by mouth daily.   Yes [provider]  vitamin B-12 (CYANOCOBALAMIN) 1000 MCG tablet Take 1,000 mcg by mouth daily.   Yes [provider]  acetaminophen (TYLENOL) 325 MG tablet Take 650 mg by mouth every 6 (six) hours as needed for mild pain or moderate pain.    [provider]  polyethylene glycol (MIRALAX / GLYCOLAX) 17 g packet Take 17 g by mouth daily as needed for mild constipation. 06/28/22   Esaw Grandchild A, DO  risperiDONE (RISPERDAL) 0.5 MG tablet Take 1 tablet (0.5 mg total) by mouth 2 (two) times daily. 06/11/22 07/11/22  Charm Rings, NP      VITAL SIGNS:  Blood pressure (!) 172/95, pulse 78, temperature (!) 97.3 F (36.3 C), resp. rate 18, weight 63.5 kg, SpO2 98 %.  PHYSICAL EXAMINATION:  Physical Exam  GENERAL:  66 y.o.-year-old patient lying in the bed with no acute distress.  EYES: Pupils equal, round, reactive to light and accommodation. No scleral icterus.  Extraocular muscles intact.  HEENT: Head atraumatic, normocephalic. Oropharynx and nasopharynx clear.  NECK:  Supple, no jugular venous distention. No thyroid enlargement, no tenderness.  LUNGS: Normal breath sounds bilaterally, no wheezing, rales,rhonchi or crepitation. No use of accessory muscles of respiration.  CARDIOVASCULAR: Regular rate and rhythm, S1, S2 normal. No murmurs, rubs, or gallops.  ABDOMEN: Soft, nondistended, nontender. Bowel sounds present. No organomegaly or mass.  EXTREMITIES: No pedal edema, cyanosis, or clubbing.  NEUROLOGIC: Cranial nerves II through XII are intact. Muscle strength 5/5 in all extremities. Sensation intact. Gait not checked.  PSYCHIATRIC: The patient is alert and oriented x 3.  Normal affect and good eye contact. SKIN: No obvious rash, lesion, or ulcer.   LABORATORY PANEL:   CBC Recent Labs  Lab 07/21/22 1800  WBC 10.3  HGB 10.9*  HCT 34.9*  PLT 208   ------------------------------------------------------------------------------------------------------------------  Chemistries  Recent Labs  Lab 07/21/22 1800  NA  139  K 4.2  CL 104  CO2 29  GLUCOSE 128*  BUN 29*  CREATININE 1.15  CALCIUM 9.0   ------------------------------------------------------------------------------------------------------------------  Cardiac Enzymes No results for input(s): "TROPONINI" in the last 168 hours. ------------------------------------------------------------------------------------------------------------------  RADIOLOGY:  No results found.    IMPRESSION AND PLAN:  Assessment and Plan: * Unable to ambulate - The patient has been having progressive bilateral lower extremity weakness. - This could be related deteriorating [name]'s disease. - The patient will be admitted to a medical bed. - PT consult will be obtained. - Neurology consult will be obtained.  Will defer further evaluation and medications adjustments to neurology. - I  notified Dr. Otelia Limes about the patient.  [name]'s disease (HCC) - Patient could certainly be having a flare. -- We will continue Sinemet at his same home dose 1 p.o. 6 times a day and that is 1 tablet every 4 hours from 7 AM to 7 PM as per Dr. Malvin Johns during his last visit on 07/13/2022 and Comtan 200 mg 6 times a day at the same home dose for now as well as Celexa 40 mg p.o. daily and Requip 2 mg p.o. 4 times daily, pending neurology consult.  Depression - We will continue Celexa and Risperdal.  Vitamin B12 deficiency - We will continue vitamin B12  Dyslipidemia We will continue Zetia  GERD without esophagitis - We will continue PPI therapy.   DVT prophylaxis: Lovenox.  Advanced Care Planning:  Code Status: DNR/DNI. Discussed with him. Family Communication:  The plan of care was discussed in details with the patient (and family). I answered all questions. The patient agreed to proceed with the above mentioned plan. Further management will depend upon hospital course. Disposition Plan: Back to previous home environment Consults called: Neurology. All the records are reviewed and case discussed with ED provider.  Status is: Observation  I certify that at the time of admission, it is my clinical judgment that the patient will require  hospital care extending less than 2 midnights.                            Dispo: The patient is from: ALF              Anticipated d/c is to: ALF possibly SNF              Patient currently is not medically stable to d/c.              Difficult to place patient: No  Hannah Beat M.D on 07/21/2022 at 11:55 PM  [facility]   From 7 PM-7 AM, contact night-coverage [email or web address]  CC: Primary care physician; [facility]

## 2022-07-21 NOTE — ED Notes (Signed)
Pt brought to ED rm 7 at this time, this RN now assuming care. 

## 2022-07-21 NOTE — ED Notes (Signed)
ED Provider at bedside. 

## 2022-07-21 NOTE — Assessment & Plan Note (Signed)
-   We will continue Zetia. 

## 2022-07-21 NOTE — ED Triage Notes (Signed)
Pt comes via [facility] with c/o bilateral leg weakness and abnormal sensation. Pt states this started yesterday. Pt has had recent increase in meds. Pt does have hx of parkinsons.  VSS

## 2022-07-21 NOTE — Assessment & Plan Note (Addendum)
continue vitamin B12. ?

## 2022-07-22 ENCOUNTER — Other Ambulatory Visit (HOSPITAL_COMMUNITY): Payer: Self-pay | Admitting: Psychiatry

## 2022-07-22 DIAGNOSIS — R262 Difficulty in walking, not elsewhere classified: Secondary | ICD-10-CM | POA: Diagnosis not present

## 2022-07-22 DIAGNOSIS — G20 Parkinson's disease: Secondary | ICD-10-CM | POA: Diagnosis not present

## 2022-07-22 DIAGNOSIS — E538 Deficiency of other specified B group vitamins: Secondary | ICD-10-CM | POA: Diagnosis not present

## 2022-07-22 LAB — BASIC METABOLIC PANEL
Anion gap: 5 (ref 5–15)
BUN: 26 mg/dL — ABNORMAL HIGH (ref 8–23)
CO2: 30 mmol/L (ref 22–32)
Calcium: 8.5 mg/dL — ABNORMAL LOW (ref 8.9–10.3)
Chloride: 108 mmol/L (ref 98–111)
Creatinine, Ser: 1.06 mg/dL (ref 0.61–1.24)
GFR, Estimated: >60 mL/min (ref >60)
Glucose, Bld: 96 mg/dL (ref 70–99)
Potassium: 4.1 mmol/L (ref 3.5–5.1)
Sodium: 143 mmol/L (ref 135–145)

## 2022-07-22 LAB — IRON AND TIBC
Iron: 53 ug/dL (ref 45–182)
Saturation Ratios: 19 % (ref 17.9–39.5)
TIBC: 284 ug/dL (ref 250–450)
UIBC: 231 ug/dL

## 2022-07-22 LAB — CBC
HCT: 30.9 % — ABNORMAL LOW (ref 39.0–52.0)
Hemoglobin: 9.9 g/dL — ABNORMAL LOW (ref 13.0–17.0)
MCH: 29 pg (ref 26.0–34.0)
MCHC: 32 g/dL (ref 30.0–36.0)
MCV: 90.6 fL (ref 80.0–100.0)
Platelets: 190 10*3/uL (ref 150–400)
RBC: 3.41 MIL/uL — ABNORMAL LOW (ref 4.22–5.81)
RDW: 13.2 % (ref 11.5–15.5)
WBC: 8.6 10*3/uL (ref 4.0–10.5)
nRBC: 0 % (ref 0.0–0.2)

## 2022-07-22 LAB — SARS CORONAVIRUS 2 BY RT PCR: SARS Coronavirus 2 by RT PCR: NEGATIVE

## 2022-07-22 LAB — FERRITIN: Ferritin: 99 ng/mL (ref 24–336)

## 2022-07-22 MED ORDER — CARBIDOPA-LEVODOPA 25-100 MG PO TABS
2.0000 | ORAL_TABLET | Freq: Four times a day (QID) | ORAL | Status: DC
  Administered 2022-07-23 – 2022-07-25 (×11): 2 via ORAL
  Filled 2022-07-22 (×10): qty 2

## 2022-07-22 MED ORDER — MELATONIN 5 MG PO TABS
5.0000 mg | ORAL_TABLET | Freq: Every evening | ORAL | Status: DC | PRN
  Administered 2022-07-22 – 2022-07-27 (×3): 5 mg via ORAL
  Filled 2022-07-22 (×3): qty 1

## 2022-07-22 MED ORDER — MORPHINE SULFATE (PF) 2 MG/ML IV SOLN
2.0000 mg | INTRAVENOUS | Status: DC | PRN
  Administered 2022-07-25 – 2022-07-26 (×3): 2 mg via INTRAVENOUS
  Filled 2022-07-22 (×3): qty 1

## 2022-07-22 NOTE — Consult Note (Addendum)
NEURO HOSPITALIST CONSULT NOTE   Requestig physician: Dr. Chipper Herb  Reason for Consult: More rapid wearing off of Sinemet doses over the last several months, significantly worse over the past 2 weeks  History obtained from:  Patient and Chart     HPI:                                                                                                                                          Corey Morrison is an 66 y.o. male with a history of [name]'s disease managed with Sinemet, Requip and entacapone who presents with a c/c of more rapid wearing off of Sinemet doses over the last several months, significantly worse over the past 2 weeks. He takes his Sinemet 4 times per day spaced out every 4 hours. He states that he used to not have much of a wearing off effect, but that this has progressively worsened so that now, the wearing off has become so significant that he starts to feel frozen about 1 hour before his next dose is due. He states that it usually takes about 1 hour for each dose to kick in after it is taken, giving him only about a 2 hour window after each dose where he can function reasonably well. He endorses decreased dexterity, decreased speed of movement, increased rigidity, slower speech, increased hypophonia and worsened impairment of his ambulation during the wearing-off period of each Sinemet dose. Also reports more spells of freezing. He does not take an extended release formulation and also does not take it before sleep or in the middle of the night. He states that dyskinesias and tremor have not been much of a problem for him. He sees Neurologist Dr. Malvin Johns of the [facility] clinic outpatient.   He denies weakness as the reason for his worsening. He states that it is not weakness but rather increased bradykinesia and freezing that are causing his worsened motor function.   Additional history obtained by Hospitalist team at the time of admission yesterday night has been  reviewed: "Corey Morrison is a 66 y.o. male with medical history significant for [name]'s disease, coronary artery disease, dyslipidemia, anxiety, vitamin B12 deficiency and atrial fibrillation, who presented to the emergency room with acute onset of worsening generalized weakness specifically in both lower extremities with subsequent significant difficulty with ambulation.  He admits to associated paresthesias in both lower extremities and recent worsening urinary incontinence without stool incontinence.  He has associated mild dysarthria and dysphagia as well.  No facial droop.  He admits to mild bilateral upper extremity weakness.  He stated that he can take in 45 minutes to put on his shoes.  No fever or chills.  He had mild dysuria without urinary frequency or urgency or hematuria or flank pain.  No  cough or wheezing or dyspnea. ED Course: When he came to the ER BP was 91/63 with temperature 97.3 and otherwise normal vital signs.  BP later improved to 155/85 and has been as high as 172/95.  Labs revealed a BUN of 29 and a blood glucose of 128.  CBC showed anemia close to his baseline.  Urinalysis was remarkable for 5 ketones. EKG as reviewed by me : EKG showed normal sinus rhythm with a rate of 68 with right axis deviation, left ventricular hypertrophy with QRS widening and repolarization abnormality."  Past Medical History:  Diagnosis Date   A-fib (HCC)    Anxiety disorder    Coronary artery disease    Hyperlipidemia    [name] disease (HCC)    Vitamin B12 deficiency     Past Surgical History:  Procedure Laterality Date   CARDIAC CATHETERIZATION      Family History  Problem Relation Age of Onset   Heart Problems Father             Social History:  reports that he has quit smoking. He has never used smokeless tobacco. He reports that he does not drink alcohol and does not use drugs.  No Known Allergies  MEDICATIONS:                                                                                                                      Prior to Admission:  Medications Prior to Admission  Medication Sig Dispense Refill Last Dose   aspirin 81 MG EC tablet Take 81 mg by mouth daily.   07/21/2022   BIOFREEZE 4 % GEL SMARTSIG:sparingly Topical 3 Times Daily   07/21/2022   capsicum (ZOSTRIX) 0.075 % topical cream Apply 1 Application topically 2 (two) times daily. (Apply to feet and thighs)   07/21/2022   carbidopa-levodopa (SINEMET IR) 25-100 MG tablet Take 1.5 tablets by mouth 4 (four) times daily. At 7am, 10am, 1pm, 5pm 120 tablet 2 07/21/2022   Carbidopa-Levodopa ER (SINEMET CR) 25-100 MG tablet controlled release Take 1 tablet by mouth at bedtime. At 9:00 PM. 60 tablet 2 07/20/2022   Cholecalciferol (VITAMIN D) 50 MCG (2000 UT) CAPS Take 2,000 Units by mouth daily.   07/21/2022   citalopram (CELEXA) 40 MG tablet Take 1 tablet (40 mg total) by mouth daily. 30 tablet 2 07/21/2022   entacapone (COMTAN) 200 MG tablet Take 1 tablet (200 mg total) by mouth 5 (five) times daily. Give WITH carbidopa-levodopa at 7am, 10am, 1pm, 5pm, 9pm 120 tablet 2 07/21/2022   esomeprazole (NEXIUM) 20 MG capsule Take 20 mg by mouth daily.   07/21/2022   ezetimibe (ZETIA) 10 MG tablet Take 10 mg by mouth daily.   07/21/2022   fluticasone (FLONASE) 50 MCG/ACT nasal spray Place 1 spray into both nostrils daily.   07/21/2022   melatonin 5 MG TABS Take 1 tablet (5 mg total) by mouth at bedtime. 30 tablet 2 07/20/2022   midodrine (PROAMATINE) 5 MG tablet Take 1 tablet (5  mg total) by mouth 3 (three) times daily with meals. 90 tablet 2 07/21/2022   psyllium (REGULOID) 0.52 g capsule Take 0.52 g by mouth at bedtime.   07/20/2022 at 2000   rOPINIRole (REQUIP) 1 MG tablet Take 1 tablet (1 mg total) by mouth 3 (three) times daily. 90 tablet 2 07/21/2022   sertraline (ZOLOFT) 100 MG tablet Take 100 mg by mouth daily.   07/21/2022   vitamin B-12 (CYANOCOBALAMIN) 1000 MCG tablet Take 1,000 mcg by mouth daily.   07/21/2022    acetaminophen (TYLENOL) 325 MG tablet Take 650 mg by mouth every 6 (six) hours as needed for mild pain or moderate pain.   prn at prn   polyethylene glycol (MIRALAX / GLYCOLAX) 17 g packet Take 17 g by mouth daily as needed for mild constipation. 14 each 0 prn at prn   Scheduled:  aspirin EC  81 mg Oral Daily   capsaicin  1 Application Topical BID   carbidopa-levodopa  1.5 tablet Oral QID   Carbidopa-Levodopa ER  1 tablet Oral QHS   cholecalciferol  2,000 Units Oral Daily   citalopram  40 mg Oral Daily   cyanocobalamin  1,000 mcg Oral Daily   enoxaparin (LOVENOX) injection  40 mg Subcutaneous Q24H   entacapone  200 mg Oral 5 X Daily   ezetimibe  10 mg Oral Daily   fluticasone  1 spray Each Nare Daily   midodrine  5 mg Oral TID WC   pantoprazole  40 mg Oral Daily   psyllium  1 packet Oral QHS   risperiDONE  0.5 mg Oral BID   rOPINIRole  1 mg Oral TID   sertraline  100 mg Oral Daily   Continuous:   ROS:                                                                                                                                       As per HPI.   Blood pressure 121/67, pulse 64, temperature 98.7 F (37.1 C), resp. rate 16, height 5\' 9"  (1.753 m), weight 64.8 kg, SpO2 100 %.   General Examination:                                                                                                       Physical Exam  HEENT-  [place]/AT   Lungs- Respirations unlabored Extremities- No edema   Neurological Examination Mental Status: Awake and alert. Oriented x 4. Speech is hypophonic with occasional hesitancy that  patient endorses as short spells of freezing of speech. Otherwise it is fluent and nondysarthric with intact naming and comprehension. Thought content is linear. No evidence for psychosis or hallucinations.  Cranial Nerves: II: Temporal visual fields intact with no extinction to DSS. PERRL.  III,IV, VI: EOMI with hypometric saccades noted.  V: Temp sensation equal  bilaterally VII: Smile is symmetric but with a slight grimacing quality. Hypomimia and decreased blink rate noted. Spontaneous blinks are also hypometric.  VIII: Hearing intact to conversation IX,X: Hypophonic speech XI: Symmetric shoulder shrug XII: Midline tongue  Motor: BUE 4+/5 proximally and distally BLE 4+/5 proximally and distally Asymmetric cogwheel rigidity of BUE is noted.  BLE rigidity is present as well.  No pronator drift.  Decreased finger excursion and slowing with alternating finger-tap test, worse on the left.  No significant rest tremor noted.  Sensory: Temp and light touch intact throughout, bilaterally. No extinction to DSS.  Deep Tendon Reflexes: 3+ and symmetric throughout Cerebellar: No ataxia with FNF bilaterally, but with significant slowing Gait: Deferred   Lab Results: Basic Metabolic Panel: Recent Labs  Lab 07/21/22 1800 07/22/22 0606  NA 139 143  K 4.2 4.1  CL 104 108  CO2 29 30  GLUCOSE 128* 96  BUN 29* 26*  CREATININE 1.15 1.06  CALCIUM 9.0 8.5*    CBC: Recent Labs  Lab 07/21/22 1800 07/22/22 0606  WBC 10.3 8.6  HGB 10.9* 9.9*  HCT 34.9* 30.9*  MCV 91.8 90.6  PLT 208 190    Cardiac Enzymes: No results for input(s): "CKTOTAL", "CKMB", "CKMBINDEX", "TROPONINI" in the last 168 hours.  Lipid Panel: No results for input(s): "CHOL", "TRIG", "HDL", "CHOLHDL", "VLDL", "LDLCALC" in the last 168 hours.  Imaging: DG Chest Portable 1 View  Result Date: 07/22/2022 CLINICAL DATA:  Weakness EXAM: PORTABLE CHEST 1 VIEW COMPARISON:  07/08/2022 FINDINGS: Heart and mediastinal contours are within normal limits. No focal opacities or effusions. No acute bony abnormality. IMPRESSION: No active disease. Electronically Signed   By: Rolm Baptise M.D.   On: 07/22/2022 01:26     Assessment:  65 y.o. male with a history of [name]'s disease managed with Sinemet, Requip and entacapone who presents with a c/c of more rapid wearing off of Sinemet doses  over the last several months, significantly worse over the past 2 weeks. He takes his Sinemet 4 times per day spaced out every 4 hours. He states that he used to not have much of a wearing off effect, but that this has progressively worsened so that now, the wearing off has become so significant that he starts to feel frozen about 1 hour before his next dose is due. He states that it usually takes about 1 hour for each dose to kick in after it is taken, giving him only about a 2 hour window after each dose where he can function reasonably well. He endorses decreased dexterity, decreased speed of movement, increased rigidity, slower speech, increased hypophonia and worsened impairment of his ambulation during the wearing-off period of each Sinemet dose. Also reports more spells of freezing. He does not take an extended release formulation and also does not take it before sleep or in the middle of the night. He states that dyskinesias and tremor have not been much of a problem for him. He sees Neurologist Dr. Melrose Nakayama of the [facility] clinic outpatient.  - Exam reveals multiple findings consistent with his diagnosis of [name]'s disease. Exam was performed approximately 3.5 hours after his most recent dose  of Sinemet.  - Most likely etiology for his complaints at presentation is progressively earlier and more severe dopaminergic medication wearing-off symptoms. This is a typical feature of [name]'s disease as it progresses, requiring compensatory medication adjustments over time.  - The patient states that he is willing to take the risk of increased dyskinesias in return for less rapid wearing off of his Sinemet doses.    Recommendations:  - Increase his immediate-release Sinemet doses to 2 tablets 4 times per day. Continue with the current dosing schedule - Continue with his current nighttime dose of Sinemet CR.  - Continue with current dosing regimens of Comtan and Requip - PT/OT.  - Monitor for  possible improvements of his symptoms with the increased Sinemet dosing regimen   Electronically signed: Dr. Kerney Elbe 07/22/2022, 8:17 PM

## 2022-07-22 NOTE — Evaluation (Signed)
Physical Therapy Evaluation Patient Details Name: Corey Morrison MRN: [id number] DOB: Jan 01, 1956 Today's Date: 07/22/2022  History of Present Illness  Pt is a 66 y/o M admitted on 07/21/22 after presenting to the ED with c/o acute onset of worsening generalized weakness in BLE & subsequent difficulty with ambulation. PMH: parkinson's disease, CAD, dyslipidemia, anxiety, vit B12 deficiency, a-fib  Clinical Impression  Pt seen for PT evaluation with pt agreeable to tx. Pt reports he resides at [facility] where he ambulates with a rollator & primarily stays in his room. On this date, pt is able to complete supine>sit with HOB elevated, bed rails & supervision & STS with CGA. Once pt begins to ambulate he demonstrates significant impairments. Pt is unable to follow cuing to look down at floor, requires assistance to advance either LE to initiate steps & is limited by festinating gait pattern & freezing episodes. Once in recliner, pt appears to have LUE twitching & continues to gaze up/to the R but when asked if he feels well, is able to look at PT & reports he feels fine. After sitting ~1 minute pt appears more relaxed - notified MD & nurse of events of session. Pt would benefit from STR upon d/c to maximize independence with functional mobility & reduce fall risk prior to return home.   Recommendations for follow up therapy are one component of a multi-disciplinary discharge planning process, led by the attending physician.  Recommendations may be updated based on patient status, additional functional criteria and insurance authorization.  Follow Up Recommendations Skilled nursing-short term rehab (<3 hours/day) Can patient physically be transported by private vehicle: No    Assistance Recommended at Discharge Frequent or constant Supervision/Assistance  Patient can return home with the following  A lot of help with walking and/or transfers;A lot of help with bathing/dressing/bathroom;Assist for  transportation;Assistance with cooking/housework;Help with stairs or ramp for entrance;Direct supervision/assist for medications management    Equipment Recommendations None recommended by PT  Recommendations for Other Services       Functional Status Assessment Patient has had a recent decline in their functional status and demonstrates the ability to make significant improvements in function in a reasonable and predictable amount of time.     Precautions / Restrictions Precautions Precautions: Fall Restrictions Weight Bearing Restrictions: No      Mobility  Bed Mobility Overal bed mobility: Needs Assistance Bed Mobility: Supine to Sit     Supine to sit: Supervision, HOB elevated     General bed mobility comments: HOB elevated, bed rails    Transfers Overall transfer level: Needs assistance Equipment used: Rolling walker (2 wheels) Transfers: Sit to/from Stand Sit to Stand: Min guard           General transfer comment: STS from EOB    Ambulation/Gait Ambulation/Gait assistance: Mod assist Gait Distance (Feet): 10 Feet Assistive device: Rolling walker (2 wheels)   Gait velocity: decreased     General Gait Details: Pt with festinating gait pattern and multiple frequent freezing episodes. PT provides cuing for "BIG" movements without success, then cuing to step over lines on floor but pt requires cuing to look down at floor to do so. PT ultimately provides pt with tactile cuing & at times, manual facilitation, to take steps. Pt requires MAX assist for hand placement on chair armrest & to sequence turning to sit in recliner.  Stairs            Wheelchair Mobility    Modified Rankin (Stroke Patients Only)  Balance Overall balance assessment: Needs assistance Sitting-balance support: Feet supported, Bilateral upper extremity supported Sitting balance-Leahy Scale: Fair Sitting balance - Comments: supervision static sitting EOB   Standing balance  support: Bilateral upper extremity supported, During functional activity Standing balance-Leahy Scale: Fair                               Pertinent Vitals/Pain Pain Assessment Pain Assessment: No/denies pain    Home Living Family/patient expects to be discharged to:: Assisted living [facility])                 Home Equipment: Rollator (4 wheels);Cane - single point;Shower seat;Grab bars - toilet;Grab bars - tub/shower;Hand held shower head      Prior Function Prior Level of Function : Independent/Modified Independent             Mobility Comments: Pt reports he's ambulatory with rollator, mostly stays in room vs walking to the dining room.       Hand Dominance        Extremity/Trunk Assessment   Upper Extremity Assessment Upper Extremity Assessment: Generalized weakness    Lower Extremity Assessment Lower Extremity Assessment: Generalized weakness       Communication   Communication: No difficulties  Cognition Arousal/Alertness: Awake/alert Behavior During Therapy: WFL for tasks assessed/performed Overall Cognitive Status: No family/caregiver present to determine baseline cognitive functioning                                 General Comments: Pt oriented to location, situation, & month/date but not year (pt reports it's "2003, 2004"). Follows simple commands with extra time during session.        General Comments      Exercises     Assessment/Plan    PT Assessment Patient needs continued PT services  PT Problem List Decreased coordination;Decreased activity tolerance;Decreased balance;Decreased mobility;Decreased knowledge of use of DME;Decreased safety awareness;Decreased cognition       PT Treatment Interventions DME instruction;Therapeutic exercise;Gait training;Balance training;Neuromuscular re-education;Functional mobility training;Therapeutic activities;Patient/family education;Manual techniques;Cognitive  remediation    PT Goals (Current goals can be found in the Care Plan section)  Acute Rehab PT Goals Patient Stated Goal: get better PT Goal Formulation: With patient Time For Goal Achievement: 08/06/22 Potential to Achieve Goals: Fair    Frequency Min 2X/week     Co-evaluation               AM-PAC PT "6 Clicks" Mobility  Outcome Measure Help needed turning from your back to your side while in a flat bed without using bedrails?: None Help needed moving from lying on your back to sitting on the side of a flat bed without using bedrails?: A Little Help needed moving to and from a bed to a chair (including a wheelchair)?: A Lot Help needed standing up from a chair using your arms (e.g., wheelchair or bedside chair)?: A Little Help needed to walk in hospital room?: A Lot Help needed climbing 3-5 steps with a railing? : A Lot 6 Click Score: 16    End of Session   Activity Tolerance: Patient tolerated treatment well Patient left: in chair;with chair alarm set;with call bell/phone within reach Nurse Communication: Mobility status (events of session, notified MD as well.) PT Visit Diagnosis: Unsteadiness on feet (R26.81);Difficulty in walking, not elsewhere classified (R26.2);Muscle weakness (generalized) (M62.81);Other abnormalities of gait and mobility (R26.89)  Time: 3818-2993 PT Time Calculation (min) (ACUTE ONLY): 11 min   Charges:   PT Evaluation $PT Eval Moderate Complexity: 1 Mod          Aleda Grana, PT, DPT 07/22/22, 12:03 PM   Sandi Mariscal 07/22/2022, 12:00 PM

## 2022-07-22 NOTE — Evaluation (Signed)
Occupational Therapy Evaluation Patient Details Name: Corey Morrison MRN: [id number] DOB: 1956-05-28 Today's Date: 07/22/2022   History of Present Illness Pt is a 66 y/o M admitted on 07/21/22 after presenting to the ED with c/o acute onset of worsening generalized weakness in BLE & subsequent difficulty with ambulation. PMH: parkinson's disease, CAD, dyslipidemia, anxiety, vit B12 deficiency, a-fib   Clinical Impression   [name] was seen for OT evaluation this date. Prior to hospital admission, pt was MOD I for household mobility and ADLs using 4WW. Pt lives at [facility], assist for meals. Pt presents to acute OT demonstrating impaired ADL performance and functional mobility 2/2 decreased activity tolerance, poor safety awareness, and functional ROM/balance deficits. Pt currently requires MIN A bathing UB/LB in sitting, intermittent posterior lean requiring assist to correct. Initial CGA + RW for mobility tolerates >50 ft, decreasing to MOD A as pt fatigues and [name]'s symptoms increase - assist to manage RW and initiate turns. SUPERVISION don/doff R sock, pt freezes and unable to lift L foot requires MAX A to don sock. Pt would benefit from skilled OT to address noted impairments and functional limitations (see below for any additional details). Upon hospital discharge, recommend STR to maximize pt safety and return to PLOF.   Recommendations for follow up therapy are one component of a multi-disciplinary discharge planning process, led by the attending physician.  Recommendations may be updated based on patient status, additional functional criteria and insurance authorization.   Follow Up Recommendations  Skilled nursing-short term rehab (<3 hours/day)    Assistance Recommended at Discharge Intermittent Supervision/Assistance  Patient can return home with the following A little help with walking and/or transfers;A little help with bathing/dressing/bathroom    Functional Status  Assessment  Patient has had a recent decline in their functional status and demonstrates the ability to make significant improvements in function in a reasonable and predictable amount of time.  Equipment Recommendations  None recommended by OT    Recommendations for Other Services       Precautions / Restrictions Precautions Precautions: Fall Restrictions Weight Bearing Restrictions: No      Mobility Bed Mobility               General bed mobility comments: received and left in chair    Transfers Overall transfer level: Needs assistance Equipment used: Rolling walker (2 wheels) Transfers: Sit to/from Stand Sit to Stand: Min assist           General transfer comment: initial GGA from chair, MIN A from bed      Balance Overall balance assessment: Needs assistance Sitting-balance support: No upper extremity supported, Feet supported Sitting balance-Leahy Scale: Poor   Postural control: Posterior lean Standing balance support: Single extremity supported, During functional activity Standing balance-Leahy Scale: Fair                             ADL either performed or assessed with clinical judgement   ADL Overall ADL's : Needs assistance/impaired                                       General ADL Comments: Initial CGA + RW for mobility decreasing to MOD A as pt fatigues and [name]'s symptoms increase - assist to manage RW and initiate turns. MIN A bathing UB/LB in sitting, intermittent posterior lean requiring assist to correct.  SUPERVISION don/doff R sock, pt freezes and unable to lift L foot requires MAX A to don sock.      Pertinent Vitals/Pain Pain Assessment Pain Assessment: No/denies pain     Hand Dominance     Extremity/Trunk Assessment Upper Extremity Assessment Upper Extremity Assessment: Overall WFL for tasks assessed   Lower Extremity Assessment Lower Extremity Assessment: Generalized weakness        Communication Communication Communication: No difficulties   Cognition Arousal/Alertness: Awake/alert Behavior During Therapy: WFL for tasks assessed/performed Overall Cognitive Status: No family/caregiver present to determine baseline cognitive functioning                                 General Comments: Follows commands with extra time. Requires cues for turning L vs R to navigate to chair / room                Home Living Family/patient expects to be discharged to:: Assisted living [facility])                             Home Equipment: Rollator (4 wheels);Cane - single point;Shower seat;Grab bars - toilet;Grab bars - tub/shower;Hand held shower head          Prior Functioning/Environment Prior Level of Function : Independent/Modified Independent             Mobility Comments: Pt reports he's ambulatory with rollator, mostly stays in room vs walking to the dining room. ADLs Comments: Completes without assist, increased time        OT Problem List: Decreased strength;Decreased range of motion;Decreased activity tolerance;Impaired balance (sitting and/or standing);Decreased safety awareness      OT Treatment/Interventions: Self-care/ADL training;Therapeutic exercise;Energy conservation;DME and/or AE instruction;Therapeutic activities;Patient/family education;Balance training    OT Goals(Current goals can be found in the care plan section) Acute Rehab OT Goals Patient Stated Goal: to return to PLOF OT Goal Formulation: With patient Time For Goal Achievement: 08/05/22 Potential to Achieve Goals: Good ADL Goals Pt Will Perform Grooming: standing;with modified independence Pt Will Perform Lower Body Dressing: with modified independence;sit to/from stand Pt Will Transfer to Toilet: with modified independence;ambulating;regular height toilet  OT Frequency: Min 2X/week    Co-evaluation              AM-PAC OT "6 Clicks" Daily  Activity     Outcome Measure Help from another person eating meals?: None Help from another person taking care of personal grooming?: A Little Help from another person toileting, which includes using toliet, bedpan, or urinal?: A Little Help from another person bathing (including washing, rinsing, drying)?: A Little Help from another person to put on and taking off regular upper body clothing?: A Little Help from another person to put on and taking off regular lower body clothing?: A Lot 6 Click Score: 18   End of Session Nurse Communication: Mobility status  Activity Tolerance: Patient tolerated treatment well Patient left: in chair;with call bell/phone within reach;with chair alarm set  OT Visit Diagnosis: Other abnormalities of gait and mobility (R26.89);Muscle weakness (generalized) (M62.81)                Time: 3557-3220 OT Time Calculation (min): 28 min Charges:  OT General Charges $OT Visit: 1 Visit OT Evaluation $OT Eval Moderate Complexity: 1 Mod OT Treatments $Self Care/Home Management : 8-22 mins  Kathie Dike, M.S. OTR/L  07/22/22, 2:03 PM  ascom [phone]

## 2022-07-22 NOTE — TOC Initial Note (Signed)
Transition of Care [facility]) - Initial/Assessment Note    Patient Details  Name: Corey Morrison MRN: [id number] Date of Birth: 10/28/1956  Transition of Care [facility]) CM/SW Contact:    Truddie Hidden, RN Phone Number: 07/22/2022, 10:08 AM  Clinical Narrative:                 Patient is from [facility].         Patient Goals and CMS Choice        Expected Discharge Plan and Services                                                Prior Living Arrangements/Services                       Activities of Daily Living Home Assistive Devices/Equipment: Built-in shower seat, Wheelchair, Environmental consultant (specify type) (4 wheel walker) ADL Screening (condition at time of admission) Patient's cognitive ability adequate to safely complete daily activities?: Yes Is the patient deaf or have difficulty hearing?: No Does the patient have difficulty seeing, even when wearing glasses/contacts?: No Does the patient have difficulty concentrating, remembering, or making decisions?: No Patient able to express need for assistance with ADLs?: Yes Does the patient have difficulty dressing or bathing?: Yes Independently performs ADLs?: No Communication: Independent Dressing (OT): Needs assistance Is this a change from baseline?: Change from baseline, expected to last <3days Grooming: Needs assistance Is this a change from baseline?: Change from baseline, expected to last <3 days Feeding: Independent Bathing: Needs assistance Is this a change from baseline?: Change from baseline, expected to last <3 days Toileting: Needs assistance Is this a change from baseline?: Change from baseline, expected to last <3 days In/Out Bed: Needs assistance Is this a change from baseline?: Change from baseline, expected to last <3 days Walks in Home: Independent with device (comment) (walker and wheelchair) Does the patient have difficulty walking or climbing stairs?: Yes Weakness of Legs: Both Weakness of  Arms/Hands: None  Permission Sought/Granted                  Emotional Assessment              Admission diagnosis:  Unable to ambulate [R26.2] Ambulatory dysfunction [R26.2] Patient Active Problem List   Diagnosis Date Noted   Unable to ambulate 07/21/2022   GERD without esophagitis 07/21/2022   Dyslipidemia 07/21/2022   Vitamin B12 deficiency 07/21/2022   Depression 07/21/2022   Weakness    Nocturnal cough 06/25/2022   Insomnia 06/25/2022   Hypotension 06/23/2022   Paroxysmal atrial fibrillation (HCC) 06/23/2022   Normocytic anemia 06/23/2022   Ambulatory dysfunction 06/22/2022   [name]'s disease (HCC) 06/11/2022   PCP:  [facility]:   [facility] - [place], [place] - [address] [place] [address] Phone: [phone] Fax: [phone]     Social Determinants of Health (SDOH) Interventions    Readmission Risk Interventions     No data to display

## 2022-07-22 NOTE — Hospital Course (Signed)
Corey Morrison is a 66 y.o. male with medical history significant for [name]'s disease, coronary artery disease, dyslipidemia, anxiety, vitamin B12 deficiency and atrial fibrillation, who presented to the emergency room with acute onset of worsening generalized weakness specifically in both lower extremities with subsequent significant difficulty with ambulation. Vital signs are stable, EKG showed sinus rhythm.  Chest x-ray has no acute changes. Lab test showed hemoglobin 9.9, creatinine normal. Neurology consult is obtained, patient is a placed on observation.  Also ordered PT/OT.  8/26.  Patient is seen by neurology, Sinemet dose increased. 8/30: Waiting for SNF placement, awaiting [place] PASSR

## 2022-07-22 NOTE — Progress Notes (Signed)
  Progress Note   Patient: Corey Morrison JME:[id number] DOB: 07-12-1956 DOA: 07/21/2022     0 DOS: the patient was seen and examined on 07/22/2022   Brief hospital course: Corey Morrison is a 66 y.o. male with medical history significant for [name]'s disease, coronary artery disease, dyslipidemia, anxiety, vitamin B12 deficiency and atrial fibrillation, who presented to the emergency room with acute onset of worsening generalized weakness specifically in both lower extremities with subsequent significant difficulty with ambulation. Vital signs are stable, EKG showed sinus rhythm.  Chest x-ray has no acute changes. Lab test showed hemoglobin 9.9, creatinine normal. Neurology consult is obtained, patient is a placed on observation.  Also ordered PT/OT.  Assessment and Plan: * Unable to ambulate [name]'s disease [facility] Patient has been having increased leg weakness.  There is no evidence of any infection.  COVID-negative.  No diarrhea.  No respiratory symptoms. There is no recent adjustment of medication for [name] disease. Neurology consult is pending. We will start PT/OT.  Depression Continue home medicines.  Vitamin B12 deficiency Continue vitamin B12, check iron level.  Hemoglobin is lower, but no evidence of active bleeding.  Recheck level tomorrow.  Dyslipidemia Zetia.  GERD without esophagitis PPI.       Subjective:  Patient still complaining of bilateral leg weakness.  No back pain, no arm weakness. No shortness of breath.  No dysuria or hematuria.  No diarrhea abdominal pain  Physical Exam: Vitals:   07/22/22 0030 07/22/22 0055 07/22/22 0442 07/22/22 0916  BP: (!) 158/93 (!) 143/90 124/68 128/77  Pulse: 69 85 63 72  Resp: 18 16 16  (!) 24  Temp:  (!) 97.5 F (36.4 C) 97.8 F (36.6 C) 98.6 F (37 C)  TempSrc:  Oral    SpO2: 99% 100% 99% 97%  Weight:  64.8 kg    Height:  5\' 9"  (1.753 m)     General exam: Appears calm and comfortable  Respiratory system:  Clear to auscultation. Respiratory effort normal. Cardiovascular system: S1 & S2 heard, RRR. No JVD, murmurs, rubs, gallops or clicks. No pedal edema. Gastrointestinal system: Abdomen is nondistended, soft and nontender. No organomegaly or masses felt. Normal bowel sounds heard. Central nervous system: Alert and oriented. No focal neurological deficits. Extremities: Symmetric 5 x 5 power. Skin: No rashes, lesions or ulcers Psychiatry: Judgement and insight appear normal. Mood & affect appropriate.   Data Reviewed:  Reviewed x-rays and lab results.  Family Communication: None  Disposition: Status is: Observation   Planned Discharge Destination:  pending    Time spent: 35 minutes  Author: , MD 07/22/2022 10:22 AM  For on call review [email or web address].

## 2022-07-23 DIAGNOSIS — I517 Cardiomegaly: Secondary | ICD-10-CM | POA: Diagnosis present

## 2022-07-23 DIAGNOSIS — Z87891 Personal history of nicotine dependence: Secondary | ICD-10-CM | POA: Diagnosis not present

## 2022-07-23 DIAGNOSIS — F32A Depression, unspecified: Secondary | ICD-10-CM | POA: Diagnosis present

## 2022-07-23 DIAGNOSIS — R296 Repeated falls: Secondary | ICD-10-CM | POA: Diagnosis present

## 2022-07-23 DIAGNOSIS — R32 Unspecified urinary incontinence: Secondary | ICD-10-CM | POA: Diagnosis present

## 2022-07-23 DIAGNOSIS — K219 Gastro-esophageal reflux disease without esophagitis: Secondary | ICD-10-CM | POA: Diagnosis present

## 2022-07-23 DIAGNOSIS — T50905D Adverse effect of unspecified drugs, medicaments and biological substances, subsequent encounter: Secondary | ICD-10-CM | POA: Diagnosis not present

## 2022-07-23 DIAGNOSIS — R441 Visual hallucinations: Secondary | ICD-10-CM | POA: Diagnosis not present

## 2022-07-23 DIAGNOSIS — E785 Hyperlipidemia, unspecified: Secondary | ICD-10-CM | POA: Diagnosis present

## 2022-07-23 DIAGNOSIS — I951 Orthostatic hypotension: Secondary | ICD-10-CM | POA: Diagnosis present

## 2022-07-23 DIAGNOSIS — F05 Delirium due to known physiological condition: Secondary | ICD-10-CM | POA: Diagnosis not present

## 2022-07-23 DIAGNOSIS — Z66 Do not resuscitate: Secondary | ICD-10-CM | POA: Diagnosis present

## 2022-07-23 DIAGNOSIS — Z515 Encounter for palliative care: Secondary | ICD-10-CM | POA: Diagnosis not present

## 2022-07-23 DIAGNOSIS — R443 Hallucinations, unspecified: Secondary | ICD-10-CM | POA: Diagnosis not present

## 2022-07-23 DIAGNOSIS — I480 Paroxysmal atrial fibrillation: Secondary | ICD-10-CM | POA: Diagnosis present

## 2022-07-23 DIAGNOSIS — I2510 Atherosclerotic heart disease of native coronary artery without angina pectoris: Secondary | ICD-10-CM | POA: Diagnosis present

## 2022-07-23 DIAGNOSIS — R400 Somnolence: Secondary | ICD-10-CM | POA: Diagnosis present

## 2022-07-23 DIAGNOSIS — G629 Polyneuropathy, unspecified: Secondary | ICD-10-CM | POA: Diagnosis present

## 2022-07-23 DIAGNOSIS — G20 Parkinson's disease: Secondary | ICD-10-CM | POA: Diagnosis present

## 2022-07-23 DIAGNOSIS — R262 Difficulty in walking, not elsewhere classified: Secondary | ICD-10-CM | POA: Diagnosis present

## 2022-07-23 DIAGNOSIS — Z20822 Contact with and (suspected) exposure to covid-19: Secondary | ICD-10-CM | POA: Diagnosis present

## 2022-07-23 DIAGNOSIS — Z7982 Long term (current) use of aspirin: Secondary | ICD-10-CM | POA: Diagnosis not present

## 2022-07-23 DIAGNOSIS — H9190 Unspecified hearing loss, unspecified ear: Secondary | ICD-10-CM | POA: Diagnosis present

## 2022-07-23 DIAGNOSIS — E538 Deficiency of other specified B group vitamins: Secondary | ICD-10-CM | POA: Diagnosis present

## 2022-07-23 DIAGNOSIS — F419 Anxiety disorder, unspecified: Secondary | ICD-10-CM | POA: Diagnosis present

## 2022-07-23 DIAGNOSIS — G249 Dystonia, unspecified: Secondary | ICD-10-CM | POA: Diagnosis present

## 2022-07-23 DIAGNOSIS — Z79899 Other long term (current) drug therapy: Secondary | ICD-10-CM | POA: Diagnosis not present

## 2022-07-23 DIAGNOSIS — R29898 Other symptoms and signs involving the musculoskeletal system: Secondary | ICD-10-CM | POA: Diagnosis not present

## 2022-07-23 LAB — CBC
HCT: 32.9 % — ABNORMAL LOW (ref 39.0–52.0)
Hemoglobin: 10.7 g/dL — ABNORMAL LOW (ref 13.0–17.0)
MCH: 29.2 pg (ref 26.0–34.0)
MCHC: 32.5 g/dL (ref 30.0–36.0)
MCV: 89.6 fL (ref 80.0–100.0)
Platelets: 181 10*3/uL (ref 150–400)
RBC: 3.67 MIL/uL — ABNORMAL LOW (ref 4.22–5.81)
RDW: 13 % (ref 11.5–15.5)
WBC: 8.3 10*3/uL (ref 4.0–10.5)
nRBC: 0 % (ref 0.0–0.2)

## 2022-07-23 NOTE — Progress Notes (Signed)
  Progress Note   Patient: Corey Morrison DXA:[id number] DOB: June 06, 1956 DOA: 07/21/2022     0 DOS: the patient was seen and examined on 07/23/2022   Brief hospital course: Jerl Munyan is a 66 y.o. male with medical history significant for [name]'s disease, coronary artery disease, dyslipidemia, anxiety, vitamin B12 deficiency and atrial fibrillation, who presented to the emergency room with acute onset of worsening generalized weakness specifically in both lower extremities with subsequent significant difficulty with ambulation. Vital signs are stable, EKG showed sinus rhythm.  Chest x-ray has no acute changes. Lab test showed hemoglobin 9.9, creatinine normal. Neurology consult is obtained, patient is a placed on observation.  Also ordered PT/OT.  8/26.  Patient is seen by neurology, Sinemet dose increased.  Assessment and Plan: * Unable to ambulate [name]'s disease [facility]) Patient is evaluated by neurology, condition is caused by [name] disease.  Sinemet dose increased, patient seems to be better with his strength.  Continue PT/OT, still recommended nursing home placement.   Depression Continue home medicines.   Vitamin B12 deficiency Continue vitamin B12, no iron deficiency.  Dyslipidemia Zetia.   GERD without esophagitis PPI.        Subjective:  He still has significant tremor, leg strength seem to be better today. No short of breath cough.  Physical Exam: Vitals:   07/22/22 1517 07/22/22 2226 07/23/22 0445 07/23/22 0743  BP: 121/67 128/75 138/83 94/60  Pulse: 64 64 70 73  Resp: 16 16 16 18   Temp: 98.7 F (37.1 C) 98.2 F (36.8 C) 98.2 F (36.8 C) 98 F (36.7 C)  TempSrc:      SpO2: 100% 100% 97% 96%  Weight:      Height:       General exam: Appears calm and comfortable  Respiratory system: Clear to auscultation. Respiratory effort normal. Cardiovascular system: S1 & S2 heard, RRR. No JVD, murmurs, rubs, gallops or clicks. No pedal  edema. Gastrointestinal system: Abdomen is nondistended, soft and nontender. No organomegaly or masses felt. Normal bowel sounds heard. Central nervous system: Alert and oriented. No focal neurological deficits. Extremities: tremor Skin: No rashes, lesions or ulcers Psychiatry: Judgement and insight appear normal. Mood & affect appropriate.    Data Reviewed:  Lab results reviewed.  Family Communication:   Disposition: Status is: Observation The patient will require care spanning > 2 midnights and should be moved to inpatient because: Severity of disease, need close monitoring after medication adjustment.  Planned Discharge Destination: Skilled nursing facility    Time spent: 35 minutes  Author: , MD 07/23/2022 11:03 AM  For on call review [email or web address]/25/[email or web address].

## 2022-07-23 NOTE — TOC Progression Note (Addendum)
Transition of Care [facility]) - Progression Note    Patient Details  Name: Shykeem Resurreccion MRN: [id number] Date of Birth: 1956/07/21  Transition [facility]) CM/SW Contact  Kemper Durie, RN Phone Number: 07/23/2022, 3:27 PM  Clinical Narrative:      PT/OT recommended patient be placed at SNF for short term rehab.  Patient notified, prefers to go back to [facility] as he was receiving home health PT/OT/SLP prior to admission to hospital.  Call placed to [place] at [facility], informed that if SNF is recommended, he will have to go there prior to return to [facility].  Spoke with patient at bedside, provided update and inquired about facility choices.  He defers decision to friend [name].  Call placed to [facility], voice message left.  Will follow up tomorrow to determine SNF preference and to possibly initiate bed search.     1623: Spoke with friend Caryn Bee, updated of recommendations, he agrees.  First choice of placement is [facility].  He will research and notify RNCM of alternate choices to initiate bed search.     Expected Discharge Plan and Services                                                 Social Determinants of Health (SDOH) Interventions    Readmission Risk Interventions     No data to display

## 2022-07-23 NOTE — Progress Notes (Signed)
Subjective: States that he feels his tremor is worse on the higher dose of Sinemet. He gives inconsistent responses regarding whether the overall effect of the higher dose has improved his symptoms, but does state that he would like to continue the trial at least into tomorrow. RN did witness one episode of BUE and neck dyskinesia today at 3:35, but no further such events. Most recent Sinemet dose given at 5:10 PM. Exam performed at 6:25 PM.   Objective: Current vital signs: BP 135/85 (BP Location: Right Arm)   Pulse 90   Temp 98.2 F (36.8 C) (Oral)   Resp 18   Ht 5\' 9"  (1.753 m)   Wt 64.8 kg   SpO2 100%   BMI 21.10 kg/m  Vital signs in last 24 hours: Temp:  [97.9 F (36.6 C)-98.2 F (36.8 C)] 98.2 F (36.8 C) (08/26 1923) Pulse Rate:  [61-90] 90 (08/26 1923) Resp:  [16-18] 18 (08/26 1923) BP: (94-138)/(60-85) 135/85 (08/26 1923) SpO2:  [96 %-100 %] 100 % (08/26 1923)  Intake/Output from previous day: 08/25 0701 - 08/26 0700 In: -  Out: 2050 [Urine:2050] Intake/Output this shift: No intake/output data recorded. Nutritional status:  Diet Order             Diet Heart Room service appropriate? Yes; Fluid consistency: Thin  Diet effective now                  Physical Exam  HEENT-  [place]/AT   Lungs- Respirations unlabored Extremities- No edema    Neurological Examination Mental Status: Awake, alert and oriented. Speech is with  less hesitancy than was noted yesterday. Also less hypophonic than yesterday. Otherwise it is fluent and nondysarthric with intact naming and comprehension. Thought content is linear. No evidence for psychosis or hallucinations.  Cranial Nerves: II: Makes eye contact during interview  III,IV, VI: EOMI with mildly hypometric saccades and jerky smooth pursuits noted.  VII: Smile is symmetric and more expressive than yesterday. Staring quality of facies seems somewhat less than yesterday.   VIII: Hearing intact to conversation IX,X: Hypophonic  speech is improved since yesterday XI: Symmetric shoulder shrug  Motor: BUE 4+/5 proximally and distally BLE 4+/5 proximally and distally Asymmetric cogwheel rigidity of BUE is noted, but seems slightly less than yesterday.   BLE rigidity is present as also noted yesterday.  No pronator drift.  Decreased finger excursion and slowing with alternating finger-tap test, worse on the left, but this is somewhat improved since yesterday.  No significant rest tremor noted. Has a new action tremor in distal BUE that was not present yesterday.  Sensory: Intact to gross touch x 4.  Deep Tendon Reflexes: 3+ and symmetric throughout Cerebellar: No ataxia with FNF bilaterally, but with significant slowing, although less bradykinetic than yesterday.  Gait: Deferred  Lab Results: Results for orders placed or performed during the hospital encounter of 07/21/22 (from the past 48 hour(s))  Basic metabolic panel     Status: Abnormal   Collection Time: 07/22/22  6:06 AM  Result Value Ref Range   Sodium 143 135 - 145 mmol/L   Potassium 4.1 3.5 - 5.1 mmol/L   Chloride 108 98 - 111 mmol/L   CO2 30 22 - 32 mmol/L   Glucose, Bld 96 70 - 99 mg/dL    Comment: Glucose reference range applies only to samples taken after fasting for at least 8 hours.   BUN 26 (H) 8 - 23 mg/dL   Creatinine, Ser 1.06 0.61 - 1.24  mg/dL   Calcium 8.5 (L) 8.9 - 10.3 mg/dL   GFR, Estimated >63 >01 mL/min    Comment: (NOTE) Calculated using the CKD-EPI Creatinine Equation (2021)    Anion gap 5 5 - 15    Comment: Performed at [facility], [address] [place], [place] [address]  CBC     Status: Abnormal   Collection Time: 07/22/22  6:06 AM  Result Value Ref Range   WBC 8.6 4.0 - 10.5 K/uL   RBC 3.41 (L) 4.22 - 5.81 MIL/uL   Hemoglobin 9.9 (L) 13.0 - 17.0 g/dL   HCT 32.3 (L) 55.7 - 32.2 %   MCV 90.6 80.0 - 100.0 fL   MCH 29.0 26.0 - 34.0 pg   MCHC 32.0 30.0 - 36.0 g/dL   RDW 02.5 42.7 - 06.2 %   Platelets 190 150  - 400 K/uL   nRBC 0.0 0.0 - 0.2 %    Comment: Performed at [facility], [address], [place], [place] [address]  SARS Coronavirus 2 by RT PCR (hospital order, performed in [facility] hospital lab) *cepheid single result test* Anterior Nasal Swab     Status: None   Collection Time: 07/22/22  9:16 AM   Specimen: Anterior Nasal Swab  Result Value Ref Range   SARS Coronavirus 2 by RT PCR NEGATIVE NEGATIVE    Comment: (NOTE) SARS-CoV-2 target nucleic acids are NOT DETECTED.  The SARS-CoV-2 RNA is generally detectable in upper and lower respiratory specimens during the acute phase of infection. The lowest concentration of SARS-CoV-2 viral copies this assay can detect is 250 copies / mL. A negative result does not preclude SARS-CoV-2 infection and should not be used as the sole basis for treatment or other patient management decisions.  A negative result may occur with improper specimen collection / handling, submission of specimen other than nasopharyngeal swab, presence of viral mutation(s) within the areas targeted by this assay, and inadequate number of viral copies (<250 copies / mL). A negative result must be combined with clinical observations, patient history, and epidemiological information.  Fact Sheet for Patients:   RoadLapTop.co.za  Fact Sheet for Healthcare Providers: http://kim-Wacha.com/  This test is not yet approved or  cleared by the [place] FDA and has been authorized for detection and/or diagnosis of SARS-CoV-2 by FDA under an Emergency Use Authorization (EUA).  This EUA will remain in effect (meaning this test can be used) for the duration of the COVID-19 declaration under Section 564(b)(1) of the Act, 21 U.S.C. section 360bbb-3(b)(1), unless the authorization is terminated or revoked sooner.  Performed at [facility], [address] [place], [place] [address]   Iron and TIBC      Status: None   Collection Time: 07/22/22 10:39 AM  Result Value Ref Range   Iron 53 45 - 182 ug/dL   TIBC 616 073 - 710 ug/dL   Saturation Ratios 19 17.9 - 39.5 %   UIBC 231 ug/dL    Comment: Performed at [facility], [address] [place], [place] [address]  Ferritin     Status: None   Collection Time: 07/22/22 10:39 AM  Result Value Ref Range   Ferritin 99 24 - 336 ng/mL    Comment: Performed at [facility], [address] [place], [place] [address]  CBC     Status: Abnormal   Collection Time: 07/23/22  4:52 AM  Result Value Ref Range   WBC 8.3 4.0 - 10.5 K/uL  RBC 3.67 (L) 4.22 - 5.81 MIL/uL   Hemoglobin 10.7 (L) 13.0 - 17.0 g/dL   HCT 32.9 (L) 39.0 - 52.0 %   MCV 89.6 80.0 - 100.0 fL   MCH 29.2 26.0 - 34.0 pg   MCHC 32.5 30.0 - 36.0 g/dL   RDW 13.0 11.5 - 15.5 %   Platelets 181 150 - 400 K/uL   nRBC 0.0 0.0 - 0.2 %    Comment: Performed at [facility], [address] [place], [place] [address]    Recent Results (from the past 240 hour(s))  SARS Coronavirus 2 by RT PCR (hospital order, performed in [facility] hospital lab) *cepheid single result test* Anterior Nasal Swab     Status: None   Collection Time: 07/22/22  9:16 AM   Specimen: Anterior Nasal Swab  Result Value Ref Range Status   SARS Coronavirus 2 by RT PCR NEGATIVE NEGATIVE Final    Comment: (NOTE) SARS-CoV-2 target nucleic acids are NOT DETECTED.  The SARS-CoV-2 RNA is generally detectable in upper and lower respiratory specimens during the acute phase of infection. The lowest concentration of SARS-CoV-2 viral copies this assay can detect is 250 copies / mL. A negative result does not preclude SARS-CoV-2 infection and should not be used as the sole basis for treatment or other patient management decisions.  A negative result may occur with improper specimen collection / handling, submission of specimen other than nasopharyngeal swab, presence of viral mutation(s) within  the areas targeted by this assay, and inadequate number of viral copies (<250 copies / mL). A negative result must be combined with clinical observations, patient history, and epidemiological information.  Fact Sheet for Patients:   https://www.patel.info/  Fact Sheet for Healthcare Providers: https://hall.com/  This test is not yet approved or  cleared by the [place] FDA and has been authorized for detection and/or diagnosis of SARS-CoV-2 by FDA under an Emergency Use Authorization (EUA).  This EUA will remain in effect (meaning this test can be used) for the duration of the COVID-19 declaration under Section 564(b)(1) of the Act, 21 U.S.C. section 360bbb-3(b)(1), unless the authorization is terminated or revoked sooner.  Performed at [facility], [address] [place], [place] [address]     Lipid Panel No results for input(s): "CHOL", "TRIG", "HDL", "CHOLHDL", "VLDL", "LDLCALC" in the last 72 hours.  Studies/Results: DG Chest Portable 1 View  Result Date: 07/22/2022 CLINICAL DATA:  Weakness EXAM: PORTABLE CHEST 1 VIEW COMPARISON:  07/08/2022 FINDINGS: Heart and mediastinal contours are within normal limits. No focal opacities or effusions. No acute bony abnormality. IMPRESSION: No active disease. Electronically Signed   By: Rolm Baptise M.D.   On: 07/22/2022 01:26    Medications: Scheduled:  aspirin EC  81 mg Oral Daily   capsaicin  1 Application Topical BID   carbidopa-levodopa  2 tablet Oral QID   Carbidopa-Levodopa ER  1 tablet Oral QHS   cholecalciferol  2,000 Units Oral Daily   citalopram  40 mg Oral Daily   cyanocobalamin  1,000 mcg Oral Daily   enoxaparin (LOVENOX) injection  40 mg Subcutaneous Q24H   entacapone  200 mg Oral 5 X Daily   ezetimibe  10 mg Oral Daily   fluticasone  1 spray Each Nare Daily   midodrine  5 mg Oral TID WC   pantoprazole  40 mg Oral Daily   psyllium  1 packet Oral QHS    risperiDONE  0.5 mg Oral BID   rOPINIRole  1 mg Oral TID   sertraline  100 mg Oral Daily    Assessment:  66 y.o. male with a history of [name]'s disease managed with Sinemet, Requip and entacapone who presents with a c/c of more rapid wearing off of Sinemet doses over the last several months, significantly worse over the past 2 weeks. He takes his Sinemet 4 times per day spaced out every 4 hours. He states that he used to not have much of a wearing off effect, but that this has progressively worsened so that now, the wearing off has become so significant that he starts to feel frozen about 1 hour before his next dose is due. He states that it usually takes about 1 hour for each dose to kick in after it is taken, giving him only about a 2 hour window after each dose where he can function reasonably well. He endorses decreased dexterity, decreased speed of movement, increased rigidity, slower speech, increased hypophonia and worsened impairment of his ambulation during the wearing-off period of each Sinemet dose. Also reports more spells of freezing. He does not take an extended release formulation and also does not take it before sleep or in the middle of the night. He states that dyskinesias and tremor have not been much of a problem for him. He sees Neurologist Dr. Malvin Johns of the [facility] outpatient.  - Today, the patient states that he feels his tremor is worse on the higher dose of Sinemet. He gives inconsistent responses regarding whether the overall effect of the higher dose has improved his symptoms, but does state that he would like to continue the trial at least into tomorrow. RN did witness one episode of BUE and neck dyskinesia today at 3:35, but no further such events. Most recent Sinemet dose given at 5:10 PM. Exam performed at 6:25 PM.  - Exam today (Saturday) again reveals multiple findings consistent with his diagnosis of [name]'s disease. Exam was performed approximately 3.5 hours  after his most recent dose of Sinemet. There is some improvement in motor function compared to Friday, when he was on the lower dose of Sinemet - Most likely etiology for his complaints at presentation is progressively earlier and more severe dopaminergic medication wearing-off symptoms. This is a typical feature of [name]'s disease as it progresses, requiring compensatory medication adjustments over time.  - The patient states that he is willing to take the risk of increased dyskinesias in return for less rapid wearing off of his Sinemet doses.      Recommendations:  - Increase his immediate-release Sinemet doses to 2 tablets 4 times per day. Continue with the current dosing schedule Will continue the trial for at least one more day.  - Continue with his current nighttime dose of Sinemet CR.  - Continue with current dosing regimens of Comtan and Requip - PT/OT.  - Monitor for possible continued improvements of his motor exam findings with the increased Sinemet dosing regimen. Observe for possible dyskinesias.    LOS: 0 days   @Electronically  signed: Dr. 07/23/2022  7:56 PM

## 2022-07-24 DIAGNOSIS — G20 Parkinson's disease: Secondary | ICD-10-CM | POA: Diagnosis not present

## 2022-07-24 DIAGNOSIS — R262 Difficulty in walking, not elsewhere classified: Secondary | ICD-10-CM | POA: Diagnosis not present

## 2022-07-24 NOTE — TOC Progression Note (Signed)
Transition of Care [facility]) - Progression Note    Patient Details  Name: Corey Morrison MRN: [id number] Date of Birth: [birth year]  Transition of [facility]) CM/SW Contact  Kemper Durie, RN Phone Number: 07/24/2022, 11:28 AM  Clinical Narrative:     Mikki Santee, only grants permission to submit bed search to [facility] at this time, search initiated.  He will notify [facility] team when he would like to expand search.  FL2 completed, will need EMS transport to facility when medically stable for discharge.        Expected Discharge Plan and Services                                                 Social Determinants of Health (SDOH) Interventions    Readmission Risk Interventions    07/23/2022    4:25 PM  Readmission Risk Prevention Plan  Transportation Screening Complete  Medication Review (RN Care Manager) Complete  PCP or Specialist appointment within 3-5 days of discharge Complete  HRI or Home Care Consult Complete  SW Recovery Care/Counseling Consult Complete  Palliative Care Screening Complete  Skilled Nursing Facility Complete

## 2022-07-24 NOTE — NC FL2 (Signed)
[place] MEDICAID FL2 LEVEL OF CARE SCREENING TOOL     IDENTIFICATION  Patient Name: Corey Morrison Birthdate: 01-Sep-1956 Sex: male Admission Date (Current Location): 07/21/2022  [facility] and [place] Number:  Chiropodist and Address:  [facility], [address] [place], [place] [address]      Provider Number: [id number]  Attending Physician Name and Address:  Marrion Coy, MD  Relative Name and Phone Number:  [name] - [phone]    Current Level of Care: Hospital Recommended Level of Care: Skilled Nursing Facility Prior Approval Number:    Date Approved/Denied:   PASRR Number: [id number]  Discharge Plan: SNF    Current Diagnoses: Patient Active Problem List   Diagnosis Date Noted   [name] disease, symptomatic (HCC) 07/23/2022   Unable to ambulate 07/21/2022   GERD without esophagitis 07/21/2022   Dyslipidemia 07/21/2022   Vitamin B12 deficiency 07/21/2022   Depression 07/21/2022   Weakness    Nocturnal cough 06/25/2022   Insomnia 06/25/2022   Hypotension 06/23/2022   Paroxysmal atrial fibrillation (HCC) 06/23/2022   Normocytic anemia 06/23/2022   Ambulatory dysfunction 06/22/2022   [name]'s disease (HCC) 06/11/2022    Orientation RESPIRATION BLADDER Height & Weight     Self, Time, Situation, Place  Normal Continent Weight: 64.8 kg Height:  5\' 9"  (175.3 cm)  BEHAVIORAL SYMPTOMS/MOOD NEUROLOGICAL BOWEL NUTRITION STATUS      Continent Diet  AMBULATORY STATUS COMMUNICATION OF NEEDS Skin   Extensive Assist Verbally Normal                       Personal Care Assistance Level of Assistance  Bathing, Feeding, Dressing Bathing Assistance: Limited assistance Feeding assistance: Limited assistance Dressing Assistance: Limited assistance     Functional Limitations Info             SPECIAL CARE FACTORS FREQUENCY  PT (By licensed PT), OT (By licensed OT)     PT Frequency: 5 times a week OT Frequency: 5  times a week            Contractures Contractures Info: Not present    Additional Factors Info  Code Status, Allergies Code Status Info: DNR Allergies Info: NKA           Current Medications (07/24/2022):  This is the current hospital active medication list Current Facility-Administered Medications  Medication Dose Route Frequency Provider Last Rate Last Admin   acetaminophen (TYLENOL) tablet 650 mg  650 mg Oral Q6H PRN Mansy, Jan A, MD   650 mg at 07/24/22 0018   Or   acetaminophen (TYLENOL) suppository 650 mg  650 mg Rectal Q6H PRN Mansy, Jan A, MD       aspirin EC tablet 81 mg  81 mg Oral Daily Mansy, Jan A, MD   81 mg at 07/24/22 0921   capsaicin (ZOSTRIX) 0.025 % cream 1 Application  1 Application Topical BID Mansy, Jan A, MD   1 Application at 07/24/22 07/26/22   carbidopa-levodopa (SINEMET IR) 25-100 MG per tablet immediate release 2 tablet  2 tablet Oral QID [id number], MD   2 tablet at 07/24/22 0921   Carbidopa-Levodopa ER (SINEMET CR) 25-100 MG tablet controlled release 1 tablet  1 tablet Oral QHS Mansy, Jan A, MD   1 tablet at 07/23/22 2121   cholecalciferol (VITAMIN D3) 25 MCG (1000 UNIT) tablet 2,000 Units  2,000 Units Oral Daily Mansy, Jan A, MD   2,000 Units at 07/24/22 [phone]  citalopram (CELEXA) tablet 40 mg  40 mg Oral Daily Mansy, Jan A, MD   40 mg at 07/24/22 0093   cyanocobalamin (VITAMIN B12) tablet 1,000 mcg  1,000 mcg Oral Daily Mansy, Jan A, MD   1,000 mcg at 07/24/22 0921   enoxaparin (LOVENOX) injection 40 mg  40 mg Subcutaneous Q24H Mansy, Jan A, MD   40 mg at 07/24/22 8182   entacapone (COMTAN) tablet 200 mg  200 mg Oral 5 X Daily Mansy, Jan A, MD   200 mg at 07/24/22 9937   ezetimibe (ZETIA) tablet 10 mg  10 mg Oral Daily Mansy, Jan A, MD   10 mg at 07/24/22 0921   fluticasone (FLONASE) 50 MCG/ACT nasal spray 1 spray  1 spray Each Nare Daily Mansy, Jan A, MD   1 spray at 07/24/22 1696   magnesium hydroxide (MILK OF MAGNESIA) suspension 30 mL  30 mL Oral  Daily PRN Mansy, Jan A, MD   30 mL at 07/24/22 0934   melatonin tablet 5 mg  5 mg Oral QHS PRN Marrion Coy, MD   5 mg at 07/22/22 2130   midodrine (PROAMATINE) tablet 5 mg  5 mg Oral TID WC Mansy, Jan A, MD   5 mg at 07/24/22 7893   morphine (PF) 2 MG/ML injection 2 mg  2 mg Intravenous Q4H PRN Mansy, Jan A, MD       ondansetron [facility]) tablet 4 mg  4 mg Oral Q6H PRN Mansy, Jan A, MD       Or   ondansetron [facility]) injection 4 mg  4 mg Intravenous Q6H PRN Mansy, Jan A, MD       pantoprazole (PROTONIX) EC tablet 40 mg  40 mg Oral Daily Mansy, Jan A, MD   40 mg at 07/24/22 8101   polyethylene glycol (MIRALAX / GLYCOLAX) packet 17 g  17 g Oral Daily PRN Mansy, Jan A, MD   17 g at 07/24/22 0934   psyllium (HYDROCIL/METAMUCIL) 1 packet  1 packet Oral QHS Mansy, Jan A, MD   1 packet at 07/22/22 2129   risperiDONE (RISPERDAL) tablet 0.5 mg  0.5 mg Oral BID Mansy, Jan A, MD   0.5 mg at 07/24/22 7510   rOPINIRole (REQUIP) tablet 1 mg  1 mg Oral TID Mansy, Jan A, MD   1 mg at 07/24/22 2585   sertraline (ZOLOFT) tablet 100 mg  100 mg Oral Daily Mansy, Jan A, MD   100 mg at 07/24/22 2778   traZODone (DESYREL) tablet 25 mg  25 mg Oral QHS PRN Mansy, Jan A, MD   25 mg at 07/22/22 2130     Discharge Medications: Please see discharge summary for a list of discharge medications.  Relevant Imaging Results:  Relevant Lab Results:   Additional Information SSN [id number]  Kemper Durie, RN

## 2022-07-24 NOTE — Progress Notes (Signed)
  Progress Note   Patient: Corey Morrison OEV:[id number] DOB: 1956-09-30 DOA: 07/21/2022     1 DOS: the patient was seen and examined on 07/24/2022   Brief hospital course: Corey Morrison is a 66 y.o. male with medical history significant for [name]'s disease, coronary artery disease, dyslipidemia, anxiety, vitamin B12 deficiency and atrial fibrillation, who presented to the emergency room with acute onset of worsening generalized weakness specifically in both lower extremities with subsequent significant difficulty with ambulation. Vital signs are stable, EKG showed sinus rhythm.  Chest x-ray has no acute changes. Lab test showed hemoglobin 9.9, creatinine normal. Neurology consult is obtained, patient is a placed on observation.  Also ordered PT/OT.  8/26.  Patient is seen by neurology, Sinemet dose increased.  Assessment and Plan: Unable to ambulate [name]'s disease [facility]) Patient is evaluated by neurology, condition is caused by [name] disease.   Continue increased dose of Sinemet.  Continue PT/OT, patient will need nursing home placement.   Depression No change in treatment plan.   Vitamin B12 deficiency Continue vitamin B12, no iron deficiency.   Dyslipidemia Zetia.   GERD without esophagitis PPI.      Subjective:  Patient feels stronger, still have difficulty with walking.  Physical Exam: Vitals:   07/23/22 1633 07/23/22 1923 07/24/22 0418 07/24/22 0736  BP: 123/72 135/85 108/65 90/60  Pulse: 61 90 66 66  Resp: 17 18 18 17   Temp: 97.9 F (36.6 C) 98.2 F (36.8 C)  98.2 F (36.8 C)  TempSrc:  Oral    SpO2: 100% 100% 99% 97%  Weight:      Height:       General exam: Appears calm and comfortable  Respiratory system: Clear to auscultation. Respiratory effort normal. Cardiovascular system: S1 & S2 heard, RRR. No JVD, murmurs, rubs, gallops or clicks. No pedal edema. Gastrointestinal system: Abdomen is nondistended, soft and nontender. No organomegaly or masses  felt. Normal bowel sounds heard. Central nervous system: Alert and oriented. No focal neurological deficits. Extremities: Symmetric 5 x 5 power. Skin: No rashes, lesions or ulcers Psychiatry: Judgement and insight appear normal. Mood & affect appropriate.  ' Data Reviewed:  There are no new results to review at this time.  Family Communication: Patient has friend at the POA, no need to update him per patient.  Disposition: Status is: Inpatient Remains inpatient appropriate because: Severity of disease.  Planned Discharge Destination: Skilled nursing facility    Time spent: 35 minutes  Author: , MD 07/24/2022 10:14 AM  For on call review www.[email or web address]/26/2022.

## 2022-07-24 NOTE — Progress Notes (Addendum)
Subjective: The patient states that his movements are better on the higher dose of Sinemet. He is having dyskinesias as a side effect but states that these are a worthwhile trade-off for improvement in his motor function.   Objective: Current vital signs: BP 107/60 (BP Location: Right Arm)   Pulse 62   Temp 97.6 F (36.4 C) (Oral)   Resp 16   Ht 5\' 9"  (1.753 m)   Wt 64.8 kg   SpO2 100%   BMI 21.10 kg/m  Vital signs in last 24 hours: Temp:  [97.6 F (36.4 C)-98.2 F (36.8 C)] 97.6 F (36.4 C) (08/27 1528) Pulse Rate:  [62-66] 62 (08/27 1528) Resp:  [16-18] 16 (08/27 1528) BP: (90-108)/(60-65) 107/60 (08/27 1528) SpO2:  [97 %-100 %] 100 % (08/27 1528)  Intake/Output from previous day: 08/26 0701 - 08/27 0700 In: -  Out: 1200 [Urine:1200] Intake/Output this shift: Total I/O In: 240 [P.O.:240] Out: -  Nutritional status:  Diet Order             Diet Heart Room service appropriate? Yes; Fluid consistency: Thin  Diet effective now                  Physical Exam  HEENT-  [place]/AT   Lungs- Respirations unlabored Extremities- No edema    Neurological Examination Mental Status: Awake, alert and oriented. Speech is with less hesitancy than was noted yesterday and Friday. Also less hypophonic. Speech is fluent and nondysarthric with intact naming and comprehension. Thought content is linear. No evidence for psychosis or hallucinations.  Cranial Nerves: II: Makes eye contact during interview  III,IV, VI: EOMI with mildly hypometric saccades and jerky smooth pursuits noted.  VII: Smile is symmetric and more expressive than yesterday and Friday. Staring quality of facies seems the same as yesterday.   VIII: Hearing intact to conversation IX,X: Hypophonic speech is improved since yesterday XI: Symmetric shoulder shrug  Motor: BUE 4+/5 proximally and distally BLE 4+/5 proximally and distally Asymmetric cogwheel rigidity of BUE is noted, but seems slightly less than on  Friday.   BLE rigidity is present as also noted yesterday.  No pronator drift.  Decreased finger excursion and slowing with alternating finger-tap test, worse on the left, but this is somewhat improved since yesterday as well as Friday.  No significant rest tremor noted.  Dyskinesias to neck, BUE and to a lesser extent his BLE are noted. They are fairly prominent but are not disturbing to the patient.  Deep Tendon Reflexes: 2+ and symmetric throughout Cerebellar: No ataxia with FNF bilaterally, less bradykinetic than yesterday.  Gait: Can stand with own power but has trouble initiating change in position. Shuffling, magnetic gait with instability requiring examiner to hold patient's hands to stabilize    Lab Results: Results for orders placed or performed during the hospital encounter of 07/21/22 (from the past 48 hour(s))  CBC     Status: Abnormal   Collection Time: 07/23/22  4:52 AM  Result Value Ref Range   WBC 8.3 4.0 - 10.5 K/uL   RBC 3.67 (L) 4.22 - 5.81 MIL/uL   Hemoglobin 10.7 (L) 13.0 - 17.0 g/dL   HCT 07/25/22 (L) 39.0 - 30.0 %   MCV 89.6 80.0 - 100.0 fL   MCH 29.2 26.0 - 34.0 pg   MCHC 32.5 30.0 - 36.0 g/dL   RDW 92.3 30.0 - 76.2 %   Platelets 181 150 - 400 K/uL   nRBC 0.0 0.0 - 0.2 %  Comment: Performed at [facility], [address], [place], [place] [address]    Recent Results (from the past 240 hour(s))  SARS Coronavirus 2 by RT PCR (hospital order, performed in [facility] hospital lab) *cepheid single result test* Anterior Nasal Swab     Status: None   Collection Time: 07/22/22  9:16 AM   Specimen: Anterior Nasal Swab  Result Value Ref Range Status   SARS Coronavirus 2 by RT PCR NEGATIVE NEGATIVE Final    Comment: (NOTE) SARS-CoV-2 target nucleic acids are NOT DETECTED.  The SARS-CoV-2 RNA is generally detectable in upper and lower respiratory specimens during the acute phase of infection. The lowest concentration of SARS-CoV-2 viral copies this assay  can detect is 250 copies / mL. A negative result does not preclude SARS-CoV-2 infection and should not be used as the sole basis for treatment or other patient management decisions.  A negative result may occur with improper specimen collection / handling, submission of specimen other than nasopharyngeal swab, presence of viral mutation(s) within the areas targeted by this assay, and inadequate number of viral copies (<250 copies / mL). A negative result must be combined with clinical observations, patient history, and epidemiological information.  Fact Sheet for Patients:   RoadLapTop.co.za  Fact Sheet for Healthcare Providers: http://kim-Carthen.com/  This test is not yet approved or  cleared by the [place] FDA and has been authorized for detection and/or diagnosis of SARS-CoV-2 by FDA under an Emergency Use Authorization (EUA).  This EUA will remain in effect (meaning this test can be used) for the duration of the COVID-19 declaration under Section 564(b)(1) of the Act, 21 U.S.C. section 360bbb-3(b)(1), unless the authorization is terminated or revoked sooner.  Performed at [facility], [address] [place], [place] [address]     Lipid Panel No results for input(s): "CHOL", "TRIG", "HDL", "CHOLHDL", "VLDL", "LDLCALC" in the last 72 hours.  Studies/Results: No results found.  Medications: Scheduled:  aspirin EC  81 mg Oral Daily   capsaicin  1 Application Topical BID   carbidopa-levodopa  2 tablet Oral QID   Carbidopa-Levodopa ER  1 tablet Oral QHS   cholecalciferol  2,000 Units Oral Daily   citalopram  40 mg Oral Daily   cyanocobalamin  1,000 mcg Oral Daily   enoxaparin (LOVENOX) injection  40 mg Subcutaneous Q24H   entacapone  200 mg Oral 5 X Daily   ezetimibe  10 mg Oral Daily   fluticasone  1 spray Each Nare Daily   midodrine  5 mg Oral TID WC   pantoprazole  40 mg Oral Daily   psyllium  1 packet  Oral QHS   risperiDONE  0.5 mg Oral BID   rOPINIRole  1 mg Oral TID   sertraline  100 mg Oral Daily   Assessment:  66 y.o. male with a history of [name]'s disease managed with Sinemet, Requip and entacapone who presents with a c/c of more rapid wearing off of Sinemet doses over the last several months, significantly worse over the past 2 weeks. He takes his Sinemet 4 times per day spaced out every 4 hours. He states that he used to not have much of a wearing off effect, but that this has progressively worsened so that now, the wearing off has become so significant that he starts to feel frozen about 1 hour before his next dose is due. He states that it usually takes about 1 hour for each dose to kick in after it is  taken, giving him only about a 2 hour window after each dose where he can function reasonably well. He endorses decreased dexterity, decreased speed of movement, increased rigidity, slower speech, increased hypophonia and worsened impairment of his ambulation during the wearing-off period of each Sinemet dose. Also reports more spells of freezing. He does not take an extended release formulation and also does not take it before sleep or in the middle of the night. He states that dyskinesias and tremor have not been much of a problem for him. He sees Neurologist Dr. Malvin Johns of the [facility] [place] [facility] outpatient.  - Today, the patient states that his movements are better on the higher dose of Sinemet. He is having dyskinesias as a side effect but states that these are a worthwhile trade-off for improvement in his motor function. .  - Exam today (Sunday) again reveals improvement in his facial expressivity and finger tapping dexterity as well as less bradykinesia. However, he is experiencing dyskinetic movements of his neck and bilateral upper extremities, as well as less prominent dyskinesias of his BLE. Exam was performed approximately 2.25 hours after his most recent dose of Sinemet. There continues  to be improvement in his motor function compared to Friday, when he was on the lower dose of Sinemet, although at the expense of dyskinesias.  - Most likely etiology for his complaints at presentation is progressively earlier and more severe dopaminergic medication wearing-off symptoms. This is a typical feature of [name]'s disease as it progresses, requiring compensatory medication adjustments over time.  - The patient states that he is willing to take the risk of increased dyskinesias in return for less rapid wearing off of his Sinemet doses.      Recommendations:  - Continue his immediate-release Sinemet at the increased dose of 2 tablets 4 times per day (was 1.5 tabs 4x per day).  - Continue with his current nighttime dose of Sinemet CR.  - Continue with current dosing regimens of Comtan and Requip - Consider starting amantadine if dyskinesias are also noted tomorrow  - PT/OT.  - Monitor for possible continued improvements of his motor exam findings with the increased Sinemet dosing regimen. Observe for possible continuing dyskinesias.  - Restart his home risperidone to prevent recrudescence of his hallucinations. Monitor for possible worsening motor function.    LOS: 1 day   @Electronically  signed: Dr. 07/24/2022  8:50 PM

## 2022-07-25 ENCOUNTER — Encounter: Payer: Self-pay | Admitting: *Deleted

## 2022-07-25 DIAGNOSIS — F32A Depression, unspecified: Secondary | ICD-10-CM | POA: Diagnosis not present

## 2022-07-25 DIAGNOSIS — E538 Deficiency of other specified B group vitamins: Secondary | ICD-10-CM | POA: Diagnosis not present

## 2022-07-25 DIAGNOSIS — G249 Dystonia, unspecified: Secondary | ICD-10-CM

## 2022-07-25 DIAGNOSIS — R29898 Other symptoms and signs involving the musculoskeletal system: Secondary | ICD-10-CM

## 2022-07-25 DIAGNOSIS — G20 Parkinson's disease: Secondary | ICD-10-CM | POA: Diagnosis not present

## 2022-07-25 DIAGNOSIS — G252 Other specified forms of tremor: Secondary | ICD-10-CM

## 2022-07-25 DIAGNOSIS — R258 Other abnormal involuntary movements: Secondary | ICD-10-CM

## 2022-07-25 DIAGNOSIS — T50905D Adverse effect of unspecified drugs, medicaments and biological substances, subsequent encounter: Secondary | ICD-10-CM

## 2022-07-25 DIAGNOSIS — R262 Difficulty in walking, not elsewhere classified: Secondary | ICD-10-CM | POA: Diagnosis not present

## 2022-07-25 LAB — URINALYSIS, COMPLETE (UACMP) WITH MICROSCOPIC
Bilirubin Urine: NEGATIVE
Glucose, UA: NEGATIVE mg/dL
Ketones, ur: 5 mg/dL — AB
Leukocytes,Ua: NEGATIVE
Nitrite: NEGATIVE
Protein, ur: NEGATIVE mg/dL
Specific Gravity, Urine: 1.02 (ref 1.005–1.030)
Squamous Epithelial / HPF: NONE SEEN (ref 0–5)
pH: 5 (ref 5.0–8.0)

## 2022-07-25 LAB — TSH: TSH: 1.875 u[IU]/mL (ref 0.350–4.500)

## 2022-07-25 MED ORDER — LACTULOSE 10 GM/15ML PO SOLN
20.0000 g | Freq: Once | ORAL | Status: AC
  Administered 2022-07-25: 20 g via ORAL
  Filled 2022-07-25: qty 30

## 2022-07-25 MED ORDER — CARBIDOPA-LEVODOPA 25-100 MG PO TABS
1.0000 | ORAL_TABLET | Freq: Every day | ORAL | Status: DC
Start: 2022-07-25 — End: 2022-07-28
  Administered 2022-07-25 – 2022-07-28 (×17): 1 via ORAL
  Filled 2022-07-25 (×17): qty 1

## 2022-07-25 MED ORDER — MIDODRINE HCL 5 MG PO TABS
7.5000 mg | ORAL_TABLET | Freq: Three times a day (TID) | ORAL | Status: DC
  Administered 2022-07-26 – 2022-07-28 (×7): 7.5 mg via ORAL
  Filled 2022-07-25 (×7): qty 2

## 2022-07-25 MED ORDER — ENTACAPONE 200 MG PO TABS
200.0000 mg | ORAL_TABLET | Freq: Every day | ORAL | Status: DC
  Administered 2022-07-25 – 2022-07-28 (×17): 200 mg via ORAL
  Filled 2022-07-25 (×19): qty 1

## 2022-07-25 NOTE — TOC Progression Note (Signed)
Transition of Care [facility]) - Progression Note    Patient Details  Name: Corey Morrison MRN: [id number] Date of Birth: 1956/02/15  [facility] CM/SW Contact  Truddie Hidden, RN Phone Number: 07/25/2022, 9:42 AM  Clinical Narrative:    Bed offer received from [facility], patient's choice. [employer] auth started.         Expected Discharge Plan and Services                                                 Social Determinants of Health (SDOH) Interventions    Readmission Risk Interventions    07/23/2022    4:25 PM  Readmission Risk Prevention Plan  Transportation Screening Complete  Medication Review (RN Care Manager) Complete  PCP or Specialist appointment within 3-5 days of discharge Complete  HRI or Home Care Consult Complete  SW Recovery Care/Counseling Consult Complete  Palliative Care Screening Complete  Skilled Nursing Facility Complete

## 2022-07-25 NOTE — Telephone Encounter (Signed)
This encounter was created in error - please disregard.

## 2022-07-25 NOTE — Progress Notes (Signed)
  Progress Note   Patient: Corey Morrison YCX:[id number] DOB: 1956/08/27 DOA: 07/21/2022     2 DOS: the patient was seen and examined on 07/25/2022   Brief hospital course: Corey Morrison is a 66 y.o. male with medical history significant for [name]'s disease, coronary artery disease, dyslipidemia, anxiety, vitamin B12 deficiency and atrial fibrillation, who presented to the emergency room with acute onset of worsening generalized weakness specifically in both lower extremities with subsequent significant difficulty with ambulation. Vital signs are stable, EKG showed sinus rhythm.  Chest x-ray has no acute changes. Lab test showed hemoglobin 9.9, creatinine normal. Neurology consult is obtained, patient is a placed on observation.  Also ordered PT/OT.  8/26.  Patient is seen by neurology, Sinemet dose increased.  Assessment and Plan: Unable to ambulate [name]'s disease [facility]) Patient is evaluated by neurology, condition is caused by [name] disease.   Patient condition appears to be improving, less rigidity, able to walk better. Currently on trazodone and melatonin for sleep.   Depression Continue current treatment.   Vitamin B12 deficiency Continue vitamin B12, no iron deficiency.   Dyslipidemia Zetia.   GERD without esophagitis PPI.      Subjective:  Patient was able to sleep intermittently.  Still has weakness.  But the less rigidity.  Physical Exam: Vitals:   07/24/22 1528 07/24/22 2107 07/25/22 0440 07/25/22 0817  BP: 107/60 (!) 103/59 105/67 110/67  Pulse: 62 82 73   Resp: 16  17   Temp: 97.6 F (36.4 C) 97.8 F (36.6 C) 98.5 F (36.9 C) 97.6 F (36.4 C)  TempSrc: Oral Oral Oral Oral  SpO2: 100% 90% 97% 97%  Weight:      Height:       General exam: Appears calm and comfortable  Respiratory system: Clear to auscultation. Respiratory effort normal. Cardiovascular system: S1 & S2 heard, RRR. No JVD, murmurs, rubs, gallops or clicks. No pedal  edema. Gastrointestinal system: Abdomen is nondistended, soft and nontender. No organomegaly or masses felt. Normal bowel sounds heard. Central nervous system: Alert and oriented. No focal neurological deficits. Extremities: Symmetric 5 x 5 power. Skin: No rashes, lesions or ulcers Psychiatry: Judgement and insight appear normal. Mood & affect appropriate.   Data Reviewed:  There are no new results to review at this time.  Family Communication:   Disposition: Status is: Inpatient Remains inpatient appropriate because: Severity of disease, pending nursing home placement.  Planned Discharge Destination: Skilled nursing facility    Time spent: 29 minutes  Author: Marrion Coy, MD 07/25/2022 10:41 AM  For on call review [email or web address].

## 2022-07-25 NOTE — Progress Notes (Signed)
Occupational Therapy Treatment Patient Details Name: Corey Morrison MRN: [id number] DOB: [birth year] Today's Date: 07/25/2022   History of present illness Pt is a 66 y/o M admitted on 07/21/22 after presenting to the ED with c/o acute onset of worsening generalized weakness in BLE & subsequent difficulty with ambulation. PMH: parkinson's disease, CAD, dyslipidemia, anxiety, vit B12 deficiency, a-fib   OT comments  Corey Morrison was seen for OT treatment on this date. Upon arrival to room pt reclined in bed, agreeable to tx. Pt requires MIN A exit bed and sit<>stand. MOD A + RW for funcitonal mobility - requires assist to manage RW and MAX cues to navigate hallway/room. Decreased safety awareness and STM deficits - pt requests to return to bed and walks past bed toward door, cues to ID bed location. Pt making good progress toward goals, will continue to follow POC. Discharge recommendation remains appropriate.     Recommendations for follow up therapy are one component of a multi-disciplinary discharge planning process, led by the attending physician.  Recommendations may be updated based on patient status, additional functional criteria and insurance authorization.    Follow Up Recommendations  Skilled nursing-short term rehab (<3 hours/day)    Assistance Recommended at Discharge Intermittent Supervision/Assistance  Patient can return home with the following  A little help with bathing/dressing/bathroom;A lot of help with walking and/or transfers   Equipment Recommendations  BSC/3in1    Recommendations for Other Services      Precautions / Restrictions Precautions Precautions: Fall Restrictions Weight Bearing Restrictions: No       Mobility Bed Mobility Overal bed mobility: Needs Assistance Bed Mobility: Supine to Sit, Sit to Supine     Supine to sit: Min assist Sit to supine: Min assist        Transfers Overall transfer level: Needs assistance Equipment used: Rolling walker (2  wheels) Transfers: Sit to/from Stand Sit to Stand: Min assist           General transfer comment: assist to stabilize RW as pt pulls into standing     Balance Overall balance assessment: Needs assistance Sitting-balance support: No upper extremity supported, Feet supported Sitting balance-Leahy Scale: Fair     Standing balance support: Bilateral upper extremity supported, During functional activity Standing balance-Leahy Scale: Poor                             ADL either performed or assessed with clinical judgement   ADL Overall ADL's : Needs assistance/impaired                                       General ADL Comments: MOD A + RW for funcitonal mobility - requires assist to manage RW and MAX cues to navigate hallway/room.      Cognition Arousal/Alertness: Awake/alert Behavior During Therapy: WFL for tasks assessed/performed Overall Cognitive Status: Impaired/Different from baseline Area of Impairment: Problem solving, Safety/judgement                         Safety/Judgement: Decreased awareness of safety, Decreased awareness of deficits   Problem Solving: Slow processing, Difficulty sequencing, Requires verbal cues, Requires tactile cues General Comments: pt requests to return to bed following mobility, then begins walking toward hallway, requires cues to navigate in hallway/room  Pertinent Vitals/ Pain       Pain Assessment Pain Assessment: No/denies pain   Frequency  Min 2X/week        Progress Toward Goals  OT Goals(current goals can now be found in the care plan section)  Progress towards OT goals: Progressing toward goals  Acute Rehab OT Goals Patient Stated Goal: to return to PLOF OT Goal Formulation: With patient Time For Goal Achievement: 08/05/22 Potential to Achieve Goals: Good ADL Goals Pt Will Perform Grooming: standing;with modified independence Pt Will Perform Lower Body  Dressing: with modified independence;sit to/from stand Pt Will Transfer to Toilet: with modified independence;ambulating;regular height toilet  Plan Discharge plan remains appropriate;Frequency remains appropriate    Co-evaluation                 AM-PAC OT "6 Clicks" Daily Activity     Outcome Measure   Help from another person eating meals?: None Help from another person taking care of personal grooming?: A Lot Help from another person toileting, which includes using toliet, bedpan, or urinal?: A Lot Help from another person bathing (including washing, rinsing, drying)?: A Little Help from another person to put on and taking off regular upper body clothing?: A Little Help from another person to put on and taking off regular lower body clothing?: A Lot 6 Click Score: 16    End of Session    OT Visit Diagnosis: Other abnormalities of gait and mobility (R26.89);Muscle weakness (generalized) (M62.81)   Activity Tolerance Patient tolerated treatment well   Patient Left in bed;with call bell/phone within reach;with bed alarm set   Nurse Communication Mobility status        Time: 7322-0254 OT Time Calculation (min): 18 min  Charges: OT General Charges $OT Visit: 1 Visit OT Treatments $Therapeutic Activity: 8-22 mins  Kathie Dike, M.S. OTR/L  07/25/22, 2:25 PM  ascom [phone]

## 2022-07-25 NOTE — Progress Notes (Addendum)
Physical Therapy Treatment Patient Details Name: Corey Morrison MRN: [id number] DOB: [birth year] Today's Date: 07/25/2022   History of Present Illness Pt is a 66 y/o M admitted on 07/21/22 after presenting to the ED with c/o acute onset of worsening generalized weakness in BLE & subsequent difficulty with ambulation. PMH: parkinson's disease, CAD, dyslipidemia, anxiety, vit B12 deficiency, a-fib    PT Comments    Arrived at pt room.  He is side lying in bed and trying to reach lunch tray that arrived.  Pt agrees to OOB to eat.  Assisted to EOB with min a x 1 and cues for hand placements and to reach for rails but overall only needs assist to get upper body up off bed.  Steady in sitting.  He is able to stand with min a x 1 and take several steps in place.  He is able to step to chair and remains standing for a brief period of time.  Less festinating gait noted today but he did not walk as far as last session as he was hungry and wanting to eat.  Cued to sit and does not reach back for chair despite cues.  He does have a brief period of L hand irregular movements where he does seem to extend and spread fingers out.  When questioned he stated he didn't "feel right" but it passed quickly.  He remains up in chair with needs met and safety precautions in place while eating.  RN and MD aware through secure chat and feels it is Parkinsons related.   Recommendations for follow up therapy are one component of a multi-disciplinary discharge planning process, led by the attending physician.  Recommendations may be updated based on patient status, additional functional criteria and insurance authorization.  Follow Up Recommendations  Skilled nursing-short term rehab (<3 hours/day)     Assistance Recommended at Discharge Frequent or constant Supervision/Assistance  Patient can return home with the following A lot of help with walking and/or transfers;A lot of help with bathing/dressing/bathroom;Assist for  transportation;Assistance with cooking/housework;Help with stairs or ramp for entrance;Direct supervision/assist for medications management   Equipment Recommendations       Recommendations for Other Services       Precautions / Restrictions Precautions Precautions: Fall Restrictions Weight Bearing Restrictions: No     Mobility  Bed Mobility Overal bed mobility: Needs Assistance Bed Mobility: Supine to Sit     Supine to sit: Min assist     General bed mobility comments: cues for hand placements    Transfers Overall transfer level: Needs assistance Equipment used: Rolling walker (2 wheels) Transfers: Sit to/from Stand Sit to Stand: Min assist                Ambulation/Gait Ambulation/Gait assistance: Min assist, Mod assist Gait Distance (Feet): 5 Feet Assistive device: Rolling walker (2 wheels) Gait Pattern/deviations: Step-to pattern Gait velocity: decreased     General Gait Details: less fesitnating gait today but limited by fatigue and motivation to eat lunch   Stairs             Wheelchair Mobility    Modified Rankin (Stroke Patients Only)       Balance Overall balance assessment: Needs assistance Sitting-balance support: No upper extremity supported, Feet supported Sitting balance-Leahy Scale: Fair   Postural control: Posterior lean Standing balance support: Single extremity supported, During functional activity Standing balance-Leahy Scale: Fair  Cognition Arousal/Alertness: Awake/alert Behavior During Therapy: WFL for tasks assessed/performed Overall Cognitive Status: No family/caregiver present to determine baseline cognitive functioning                                          Exercises      General Comments        Pertinent Vitals/Pain Pain Assessment Pain Assessment: No/denies pain    Home Living                          Prior Function             PT Goals (current goals can now be found in the care plan section) Progress towards PT goals: Progressing toward goals    Frequency    Min 2X/week      PT Plan Current plan remains appropriate    Co-evaluation              AM-PAC PT "6 Clicks" Mobility   Outcome Measure  Help needed turning from your back to your side while in a flat bed without using bedrails?: A Little Help needed moving from lying on your back to sitting on the side of a flat bed without using bedrails?: A Little Help needed moving to and from a bed to a chair (including a wheelchair)?: A Little Help needed standing up from a chair using your arms (e.g., wheelchair or bedside chair)?: A Little Help needed to walk in hospital room?: A Lot Help needed climbing 3-5 steps with a railing? : A Lot 6 Click Score: 16    End of Session Equipment Utilized During Treatment: Gait belt Activity Tolerance: Patient tolerated treatment well Patient left: in chair;with chair alarm set;with call bell/phone within reach Nurse Communication: Mobility status PT Visit Diagnosis: Unsteadiness on feet (R26.81);Difficulty in walking, not elsewhere classified (R26.2);Muscle weakness (generalized) (M62.81);Other abnormalities of gait and mobility (R26.89)     Time: 0110-0120 PT Time Calculation (min) (ACUTE ONLY): 10 min  Charges:  $Therapeutic Activity: 8-22 mins                   Chesley Noon, PTA 07/25/22, 1:37 PM

## 2022-07-25 NOTE — Progress Notes (Addendum)
Neurology progress note  S: Patient reports peak dose dyskinesias since increasing his sinemet that feel intolerable for him (in contrast to yesterday). He is not bradykinetic today and RAM intact, little tremor, but significant cogwheeling rigidity L>R. He did not have any dyskinesias during my exam.  I reviewed Dr. Daisy Blossom outpatient neurology notes extensively and it appears that he was actually on the following regimen (or at least this is what was prescribed):  - Sinemet 25/100 one tab 6x daily - Entacapone 200mg  6x daily - No sinemet ER 25/250 - No requip (stopped in May 2/2 hallucinations)  He has never been on amantadine. He takes midodrine 7.5mg  tid (9am/noon/4pm)  He is still having burning pain in his feet and feeling generally weak.   O:  Vitals:   07/25/22 0817 07/25/22 1421  BP: 110/67 94/63  Pulse:  75  Resp:    Temp: 97.6 F (36.4 C)   SpO2: 97%    Gen: A&Ox4, NAD, flat affect, masked facies CV: RRR Resp: CTAB  Neurologic exam Mental status: A&Ox4, follows multi-step commands Speech: hypophonic without dysarthria or aphasia CN: PERRL, VFF by confrontation, EOMI, sensation intact, face symmetric at rest, hearing intact to voice Motor: 4/5 strength diffusely BUE, 4-/5 diffusely BLE. Minimal resting tremor L>R. Severe cogwheeling rigidity LUE, moderate on RUE.  Sensory: SILT Reflexes: 2+ symm throughout Coordination: rapid alternating movements intact, bradykinetic on FNF Gait: deferred  A/P: 66 yo man with hx longstanding [name]'s disease (15 yrs) admitted with decreased dexterity, decreased speed of movement, increased rigidity, slower speech, increased hypophonia and worsened impairment of his ambulation associated with early wearing off of sinemet doses. He has not historically had significant dyskinesias. I reviewed Dr. 71 most recent notes and it appears that either our understanding of his prior to admission meds was incorrect or he was not taking  his regimen as prescribed. We increased his sinemet and he began to have significant peak-dose dyskinesias. I am going to adjust his current medications to the regimen prescribed by Dr. Daisy Blossom last visit and we will make further medication modifications based on his exam after that.  # Idiopathic [name]'s disease  - I will adjust his current medications to the regimen prescribed by Dr. Malvin Johns last visit:  - Change sinemet IR to one tab 6x daily (0700, 1000, 1300, 1600, 1900, 2200) - Change entacapone to 200mg  6x daily (administer with each dose of sinemet) - Stop sinemet ER 25/250 - Stop requip - Continue celexa 40mg  daily - Change midodrine to 7.5mg  tid (0900, 1200, 1600. do not take any later than 4pm. do not lie down or take a nap after taking this medication) - Continue risperidone 0.5mg  bid, antipsychotics can worsen [name]'s but are prescribed for him for hallucinations, benefit likely outweighs the risk - PT eval and treat  #Atrial fibrillation - Currently in NSR - Not on anticoagulation per cardiology notes 2/2 frequent falls  # Hx B12 deficiency - Last level 661 in July  # Numbness and burning pain in BLE - Check A1c, TSH - Will consider further workup +/- symptomatic tx after PD meds optimized  Will continue to follow. If motor sx (bradykinesia, rigidity) worsen on above regimen, will need to further adjust carbidopa-levodopa and entacapone. If his dyskinesias persist, will consider adding amantadine  , MD [facility] [phone]  If 7pm- 7am, please page neurology on call as listed in AMION.

## 2022-07-26 ENCOUNTER — Ambulatory Visit: Payer: Medicare Other

## 2022-07-26 DIAGNOSIS — R29898 Other symptoms and signs involving the musculoskeletal system: Secondary | ICD-10-CM | POA: Diagnosis not present

## 2022-07-26 DIAGNOSIS — E538 Deficiency of other specified B group vitamins: Secondary | ICD-10-CM | POA: Diagnosis not present

## 2022-07-26 DIAGNOSIS — F32A Depression, unspecified: Secondary | ICD-10-CM | POA: Diagnosis not present

## 2022-07-26 DIAGNOSIS — T50905D Adverse effect of unspecified drugs, medicaments and biological substances, subsequent encounter: Secondary | ICD-10-CM | POA: Diagnosis not present

## 2022-07-26 DIAGNOSIS — R262 Difficulty in walking, not elsewhere classified: Secondary | ICD-10-CM | POA: Diagnosis not present

## 2022-07-26 DIAGNOSIS — G249 Dystonia, unspecified: Secondary | ICD-10-CM | POA: Diagnosis not present

## 2022-07-26 DIAGNOSIS — G20 Parkinson's disease: Secondary | ICD-10-CM | POA: Diagnosis not present

## 2022-07-26 LAB — BASIC METABOLIC PANEL
Anion gap: 4 — ABNORMAL LOW (ref 5–15)
BUN: 36 mg/dL — ABNORMAL HIGH (ref 8–23)
CO2: 31 mmol/L (ref 22–32)
Calcium: 9 mg/dL (ref 8.9–10.3)
Chloride: 106 mmol/L (ref 98–111)
Creatinine, Ser: 0.94 mg/dL (ref 0.61–1.24)
GFR, Estimated: >60 mL/min (ref >60)
Glucose, Bld: 92 mg/dL (ref 70–99)
Potassium: 4.5 mmol/L (ref 3.5–5.1)
Sodium: 141 mmol/L (ref 135–145)

## 2022-07-26 LAB — HEMOGLOBIN A1C
Hgb A1c MFr Bld: 5.4 % (ref 4.8–5.6)
Mean Plasma Glucose: 108.28 mg/dL

## 2022-07-26 LAB — CBC
HCT: 32.8 % — ABNORMAL LOW (ref 39.0–52.0)
Hemoglobin: 10.3 g/dL — ABNORMAL LOW (ref 13.0–17.0)
MCH: 28.8 pg (ref 26.0–34.0)
MCHC: 31.4 g/dL (ref 30.0–36.0)
MCV: 91.6 fL (ref 80.0–100.0)
Platelets: 189 10*3/uL (ref 150–400)
RBC: 3.58 MIL/uL — ABNORMAL LOW (ref 4.22–5.81)
RDW: 12.9 % (ref 11.5–15.5)
WBC: 9.3 10*3/uL (ref 4.0–10.5)
nRBC: 0 % (ref 0.0–0.2)

## 2022-07-26 LAB — MAGNESIUM: Magnesium: 2.5 mg/dL — ABNORMAL HIGH (ref 1.7–2.4)

## 2022-07-26 MED ORDER — LACTULOSE 10 GM/15ML PO SOLN
20.0000 g | Freq: Every day | ORAL | Status: DC
  Administered 2022-07-26 – 2022-07-28 (×3): 20 g via ORAL
  Filled 2022-07-26 (×3): qty 30

## 2022-07-26 MED ORDER — GABAPENTIN 100 MG PO CAPS
100.0000 mg | ORAL_CAPSULE | Freq: Three times a day (TID) | ORAL | Status: DC
  Administered 2022-07-26 – 2022-07-27 (×2): 100 mg via ORAL
  Filled 2022-07-26 (×2): qty 1

## 2022-07-26 MED ORDER — OXYCODONE-ACETAMINOPHEN 5-325 MG PO TABS
1.0000 | ORAL_TABLET | ORAL | Status: DC | PRN
  Administered 2022-07-26 (×2): 1 via ORAL
  Filled 2022-07-26 (×3): qty 1

## 2022-07-26 NOTE — TOC Progression Note (Signed)
Transition of Care [facility]) - Progression Note    Patient Details  Name: Corey Morrison MRN: [id number] Date of Birth: 1956-11-27  Transition of Care [facility]) CM/SW Contact  Truddie Hidden, RN Phone Number: 07/26/2022, 1:31 PM  Clinical Narrative:    Spoke with patient at bedside. Patient accepted [employer]. [facility] auth approved [id number] 8/29-8/31.         Expected Discharge Plan and Services                                                 Social Determinants of Health (SDOH) Interventions    Readmission Risk Interventions    07/23/2022    4:25 PM  Readmission Risk Prevention Plan  Transportation Screening Complete  Medication Review (RN Care Manager) Complete  PCP or Specialist appointment within 3-5 days of discharge Complete  HRI or Home Care Consult Complete  SW Recovery Care/Counseling Consult Complete  Palliative Care Screening Complete  Skilled Nursing Facility Complete

## 2022-07-26 NOTE — Care Management Important Message (Signed)
Important Message  Patient Details  Name: Renold Kozar MRN: [id number] Date of Birth: 1956/04/24   Medicare Important Message Given:  Yes     Olegario Messier A Shonn Farruggia 07/26/2022, 1:35 PM

## 2022-07-26 NOTE — Progress Notes (Signed)
  Progress Note   Patient: Corey Morrison VVO:[id number] DOB: 05/22/1956 DOA: 07/21/2022     3 DOS: the patient was seen and examined on 07/26/2022   Brief hospital course: Corey Morrison is a 66 y.o. male with medical history significant for [name]'s disease, coronary artery disease, dyslipidemia, anxiety, vitamin B12 deficiency and atrial fibrillation, who presented to the emergency room with acute onset of worsening generalized weakness specifically in both lower extremities with subsequent significant difficulty with ambulation. Vital signs are stable, EKG showed sinus rhythm.  Chest x-ray has no acute changes. Lab test showed hemoglobin 9.9, creatinine normal. Neurology consult is obtained, patient is a placed on observation.  Also ordered PT/OT.  8/26.  Patient is seen by neurology, Sinemet dose increased.  Assessment and Plan: Unable to ambulate [name]'s disease [facility]) Patient is evaluated by neurology, condition is caused by [name] disease.   Patient condition appears to be improving, less rigidity, able to walk better. Patient has some visual hallucination, currently taking risperidone. On trazodone and melatonin for sleep.  Orthostatic hypotension secondary to [name] disease. On midodrine, blood pressure is better.   Depression Continue current treatment.   Vitamin B12 deficiency Continue vitamin B12, no iron deficiency.   Dyslipidemia Zetia.   GERD without esophagitis PPI.  Patient overall condition has been improving, currently pending nursing placement.    Subjective:  Patient still has some intermittent tremor, intermittent visual hallucination. Less rigidity, able to walk better.  Physical Exam: Vitals:   07/25/22 1927 07/25/22 2350 07/26/22 0535 07/26/22 0845  BP: 122/70 112/78 113/73 116/73  Pulse: 71 68 68   Resp: 16 16 16 15   Temp: 98.6 F (37 C) 98.7 F (37.1 C) 99.1 F (37.3 C) 98 F (36.7 C)  TempSrc: Oral Axillary Oral Oral  SpO2: 97%  97% 97% 97%  Weight:      Height:       General exam: Appears calm and comfortable  Respiratory system: Clear to auscultation. Respiratory effort normal. Cardiovascular system: S1 & S2 heard, RRR. No JVD, murmurs, rubs, gallops or clicks. No pedal edema. Gastrointestinal system: Abdomen is nondistended, soft and nontender. No organomegaly or masses felt. Normal bowel sounds heard. Central nervous system: Alert and oriented x2. No focal neurological deficits. Extremities: Symmetric 5 x 5 power. Skin: No rashes, lesions or ulcers Psychiatry: Judgement and insight appear normal. Mood & affect appropriate.   Data Reviewed:  There are no new results to review at this time.  Family Communication: None  Disposition: Status is: Inpatient Remains inpatient appropriate because: Severity of disease.  Pending nursing home placement.  Planned Discharge Destination: Skilled nursing facility    Time spent: 35 minutes  Author: , MD 07/26/2022 9:37 AM  For on call review [email or web address].

## 2022-07-26 NOTE — Progress Notes (Signed)
Neurology progress note  S: After medication adjustments yesterday to: - Sinemet 25/100 one tab 6x daily - Entacapone 200mg  6x daily - No sinemet ER 25/250 - No requip (stopped in May 2/2 hallucinations)  Patient's motor sx of PD (tremor, rigidity, bradykinesia) are significantly improved and he has had no dyskinesias. Hallucination are worse, he is seeing objects moving and shadows that look like people in the hall  Patient's chief complaint is numbness/paresthesias in BLE feet and burning pain in BLE more proximally  O:  Vitals:   07/26/22 1253 07/26/22 1710  BP: 113/65 128/85  Pulse: 69 66  Resp:    Temp:    SpO2:     Gen: A&Ox4, NAD, flat affect, masked facies CV: RRR Resp: CTAB  Neurologic exam Mental status: A&Ox4, follows multi-step commands Speech: hypophonic without dysarthria or aphasia CN: PERRL, VFF by confrontation, EOMI, sensation intact, face symmetric at rest, hearing intact to voice Motor: 4/5 strength diffusely BUE, 4-/5 diffusely BLE.No resting tremor today. Cogwheeling rigidity significantly improved, now minimal on R and mild-to-mod on L Sensory: SILT Reflexes: 2+ symm BUE, 1+ bilat patellae, absent achilles Coordination: rapid alternating movements intact, bradykinesia on FNF improved Gait: deferred  A/P: 66 yo man with hx longstanding [name]'s disease (15 yrs) admitted with decreased dexterity, decreased speed of movement, increased rigidity, slower speech, increased hypophonia and worsened impairment of his ambulation associated with early wearing off of sinemet doses. He has not historically had significant dyskinesias. I reviewed Dr. 71 most recent notes and it appears that either our understanding of his prior to admission meds was incorrect or he was not taking his regimen as prescribed. We increased his sinemet and he began to have significant peak-dose dyskinesias. On 8/29 his medications were adjusted to what Dr. 9/29 had recommended in  his last note and his motor PD sx vastly improved and his dyskinesias resolved.  He should not be on celexa and zoloft. Celexa was started by ED in Jul and discontinued 2 wks later 2/2 change in therapy but was carried over in notes. I am d/c'ing celexa.  I think hallucinations are multifactorial... he has them at baseline and prob worsened by hospital delirium. And percocet given prn for BLE neuropathic pain. I told him today we should focus on changing one medicaton either for pain or hallucinations and he picked pain. I am going to start gabapentin...it is the only neuropathic pain medication that can help PD (lyrica can worsen it). Given his mental status I am going to start at v low dose and uptitrate him slowly. Hopefully this will enable Aug to d/c his (deliriogenic) percocet. After we get him some pain relief we can see how his hallucinations are doing. Requip worsened his hallucinations before and I just stopped it yesterday so we may see some improvement on that. If his hallucinations get worse or are disturbing/scary to him we could consider risperidone increase but that will worsen his Parkinsons sx. Given that he has had PD for 15 years, it is not going to be possible to normalize his function or eliminate side effects, so goal is to optimize him as best we can  # Idiopathic [name]'s disease - Sinemet IR to one tab 6x daily (0700, 1000, 1300, 1600, 1900, 2200) - Entacapone to 200mg  6x daily (administer with each dose of sinemet) - Stopped sinemet ER 25/250 on 8/28 - Stopped requip on 8/28 - Midodrine 7.5mg  tid (0900, 1200, 1600. do not take any later than 4pm. do not lie  down or take a nap after taking this medication) - Continue risperidone 0.5mg  bid, antipsychotics can worsen [name]'s but are prescribed for him for hallucinations, benefit likely outweighs the risk. If hallucinations become worse or disturbing to him we may need to increase it but will try to avoid since that will  worsen his PD sx. - PT eval and treat  # Depression - D/c celexa - Continue zoloft 100mg  daily  # Severe peripheral neuropathy. A1c, TSH, B12 WNL. - Start gabapentin 100mg  tid, if no excess sedation or other SE will increase tmrw - Will need further workup as outpatient - sx are v severe and he would likely benefit from NCS/EMG and further workup based on those results - Wean percocet when able  #Atrial fibrillation - Currently in NSR - Not on anticoagulation per cardiology notes 2/2 frequent falls  # Hx B12 deficiency - Last level 661 in July, continue po supp  Will continue to follow.   , MD [facility] [phone]  If 7pm- 7am, please page neurology on call as listed in AMION.

## 2022-07-26 NOTE — Progress Notes (Signed)
PT Cancellation Note  Patient Details Name: Corey Morrison MRN: [id number] DOB: March 18, 1956   Cancelled Treatment:    Reason Eval/Treat Not Completed: Patient at procedure or test/unavailable (Treatment session attempted. Upon arrival to room, patient currently with neurologist and unavailable.  Will re-attempt at later date/time as appropriate and available.)  Virlan Kempker H. Manson Passey, PT, DPT, NCS 07/26/22, 3:09 PM [phone]

## 2022-07-27 DIAGNOSIS — I951 Orthostatic hypotension: Secondary | ICD-10-CM | POA: Diagnosis not present

## 2022-07-27 DIAGNOSIS — F32A Depression, unspecified: Secondary | ICD-10-CM | POA: Diagnosis not present

## 2022-07-27 DIAGNOSIS — R443 Hallucinations, unspecified: Secondary | ICD-10-CM | POA: Diagnosis not present

## 2022-07-27 DIAGNOSIS — G629 Polyneuropathy, unspecified: Secondary | ICD-10-CM

## 2022-07-27 DIAGNOSIS — G6289 Other specified polyneuropathies: Secondary | ICD-10-CM

## 2022-07-27 DIAGNOSIS — G20 Parkinson's disease: Secondary | ICD-10-CM | POA: Diagnosis not present

## 2022-07-27 LAB — URINE CULTURE: Culture: NO GROWTH

## 2022-07-27 MED ORDER — GABAPENTIN 100 MG PO CAPS
200.0000 mg | ORAL_CAPSULE | Freq: Three times a day (TID) | ORAL | Status: DC
  Administered 2022-07-27 – 2022-07-28 (×3): 200 mg via ORAL
  Filled 2022-07-27 (×3): qty 2

## 2022-07-27 MED ORDER — OXYCODONE-ACETAMINOPHEN 5-325 MG PO TABS
1.0000 | ORAL_TABLET | Freq: Three times a day (TID) | ORAL | Status: DC | PRN
  Administered 2022-07-27: 1 via ORAL
  Filled 2022-07-27: qty 1

## 2022-07-27 NOTE — TOC Progression Note (Signed)
Transition of Care [facility]) - Progression Note    Patient Details  Name: Corey Morrison MRN: [id number] Date of Birth: [birth year]  Transition of Care [facility]) CM/SW Contact  Truddie Hidden, RN Phone Number: 07/27/2022, 9:05 AM  Clinical Narrative:    [place] [facility] pending. Additional requested information submitted.         Expected Discharge Plan and Services                                                 Social Determinants of Health (SDOH) Interventions    Readmission Risk Interventions    07/23/2022    4:25 PM  Readmission Risk Prevention Plan  Transportation Screening Complete  Medication Review (RN Care Manager) Complete  PCP or Specialist appointment within 3-5 days of discharge Complete  HRI or Home Care Consult Complete  SW Recovery Care/Counseling Consult Complete  Palliative Care Screening Complete  Skilled Nursing Facility Complete

## 2022-07-27 NOTE — Assessment & Plan Note (Signed)
Multifactorial.  PT, OT recommends SNF and TOC working on placement.

## 2022-07-27 NOTE — Progress Notes (Signed)
Neurology progress note  S: Patient's motor sx of PD (tremor, rigidity, bradykinesia) are significantly improved and he has had no dyskinesias. BLE pain improved since starting gabapentin 100mg  tid yesterday; no side effects or sedation. Hallucinations improved from yesterday but still persist (he has some hallucinations at baseline). Patient is waiting for placement.  O:  Vitals:   07/27/22 0738 07/27/22 1537  BP: (!) 110/59 (!) 160/83  Pulse: 72 81  Resp: 14 17  Temp: 98 F (36.7 C) 98.3 F (36.8 C)  SpO2: 96% 100%   Gen: A&Ox4, NAD, flat affect, masked facies CV: RRR Resp: CTAB  Neurologic exam Mental status: A&Ox4, follows multi-step commands Speech: hypophonic without dysarthria or aphasia CN: PERRL, VFF by confrontation, EOMI, sensation intact, face symmetric at rest, hearing intact to voice Motor: 4/5 strength diffusely BUE, 4-/5 diffusely BLE.No resting tremor today. Cogwheeling rigidity significantly improved, now minimal on R and mild-to-mod on L Sensory: SILT Reflexes: 2+ symm BUE, 1+ bilat patellae, absent achilles Coordination: rapid alternating movements intact, bradykinesia on FNF improved Gait: deferred  A/P: 66 yo man with hx longstanding [name]'s disease (15 yrs) admitted with decreased dexterity, decreased speed of movement, increased rigidity, slower speech, increased hypophonia and worsened impairment of his ambulation associated with early wearing off of sinemet doses. He has not historically had significant dyskinesias. I reviewed Dr. 71 most recent notes and it appears that either our understanding of his prior to admission meds was incorrect or he was not taking his regimen as prescribed. We increased his sinemet and he began to have significant peak-dose dyskinesias. On 8/29 his medications were adjusted to what Dr. 9/29 had recommended in his last note and his motor PD sx vastly improved and his dyskinesias resolved.  He should not be on celexa  and zoloft. Celexa was started by ED in Jul and discontinued 2 wks later 2/2 change in therapy but was carried over in notes. celexa was d/c'd.  His hallucinations have improved since stopping requip. He has some hallucinations at baseline, likely augmented by some degree of hospital delirium. They are currently at a reasonable level and not causing him significant distress. Continue risperidone 0.5mg  bid. If hallucinations worsen may have to consider small dose increase but try to avoid this as antipsychotics will worsen PD motor sx. Avoid deliriogenic medications.   He has severe BLE neuropathic pain for which gabapentin was started yesterday at 100mg  tid. I picked gabapentin bc it is the only neuropathic pain medication that can help PD (lyrica can worsen it). He tolerated 100mg  tid well without side effects so I will increase it to 200mg  tid and would recommend continuing that dose for the rest of admission and at discharge. If needed it can be gently uptitrated in clinic. Given improved pain on gabapentin patient should be tapered off prn narcotics as soon as possible since these will worsen his delirium and hallucinations.  Given that he has had PD for 15 years, it is not going to be possible to normalize his function or eliminate side effects, so goal is to optimize him as best we can. He is doing much better than he was at admission and early on in hospitalization. No further inpatient neuro recommendations at this time.  # Idiopathic [name]'s disease - Sinemet IR one tab 6x daily (0700, 1000, 1300, 1600, 1900, 2200) - Entacapone 200mg  6x daily (administer with each dose of sinemet) - Stopped sinemet ER 25/250 on 8/28 - Stopped requip on 8/28 - Midodrine 7.5mg  tid (0900,  1200, 1600. do not take any later than 4pm. do not lie down or take a nap after taking this medication) - Continue risperidone 0.5mg  bid - PT eval and treat; patient currently awaiting placement  # Depression - D/c'd  celexa - Continue zoloft 100mg  daily  # Severe peripheral neuropathy. A1c, TSH, B12 WNL. - Increase gabapentin to 200mg  tid - Will need further workup as outpatient - sx are v severe and he would likely benefit from NCS/EMG and further workup based on those results - Wean percocet when able  #Atrial fibrillation - Currently in NSR - Not on anticoagulation per cardiology notes 2/2 frequent falls  # Hx B12 deficiency - Last level 661 in July, continue po supp  All medications will need to be prescribed at discharge. He was not taking most of his medications as prescribed which resulted in poor control of PD sx and excess side effects. Compliance will obviously be improved after discharge to facility.  Patient may f/u with established outpatient neurologist Dr.  Neurology to sign off but please re-engage if additional neurologic concerns arise.  August, MD [facility] [phone]  If 7pm- 7am, please page neurology on call as listed in AMION.

## 2022-07-27 NOTE — TOC Progression Note (Signed)
Transition of Care [facility]) - Progression Note    Patient Details  Name: Corey Morrison MRN: [id number] Date of Birth: 1956-04-04  Transition of Care [facility]) CM/SW Contact  Truddie Hidden, RN Phone Number: 07/27/2022, 3:42 PM  Clinical Narrative:    Signed 30 day convalescent stay progress note submitted to [place] [facility]. Awaiting [place] [facility].         Expected Discharge Plan and Services                                                 Social Determinants of Health (SDOH) Interventions    Readmission Risk Interventions    07/23/2022    4:25 PM  Readmission Risk Prevention Plan  Transportation Screening Complete  Medication Review (RN Care Manager) Complete  PCP or Specialist appointment within 3-5 days of discharge Complete  HRI or Home Care Consult Complete  SW Recovery Care/Counseling Consult Complete  Palliative Care Screening Complete  Skilled Nursing Facility Complete

## 2022-07-27 NOTE — Progress Notes (Signed)
Physical Therapy Treatment Patient Details Name: Corey Morrison MRN: [id number] DOB: 11-Jul-1956 Today's Date: 07/27/2022   History of Present Illness Pt is a 66 y/o M admitted on 07/21/22 after presenting to the ED with c/o acute onset of worsening generalized weakness in BLE & subsequent difficulty with ambulation. PMH: parkinson's disease, CAD, dyslipidemia, anxiety, vit B12 deficiency, a-fib    PT Comments    Patient sleeping upon arrival to room (generally drowsy if not directly engaged), but easily awakens to voice.  Eager for OOB efforts. Requiring min/mod assist for sit/stand, standing balance and bed/chair transfer with RW.  Continues with moderate posterior bias and generally festinating gait pattern (improves with direct cuing).  Limited ability to correct indep. Significant drop in BP with SPT (symptomatic), requiring reclined position in recliner for recovery. See vitals flowsheet for details; RN informed/aware.  Additional gait/standing activity deferred at this time as a result.    Recommendations for follow up therapy are one component of a multi-disciplinary discharge planning process, led by the attending physician.  Recommendations may be updated based on patient status, additional functional criteria and insurance authorization.  Follow Up Recommendations  Skilled nursing-short term rehab (<3 hours/day) Can patient physically be transported by private vehicle: Yes   Assistance Recommended at Discharge Frequent or constant Supervision/Assistance  Patient can return home with the following A lot of help with walking and/or transfers;A lot of help with bathing/dressing/bathroom;Assist for transportation;Assistance with cooking/housework;Help with stairs or ramp for entrance;Direct supervision/assist for medications management   Equipment Recommendations       Recommendations for Other Services       Precautions / Restrictions Precautions Precautions:  Fall Restrictions Weight Bearing Restrictions: No     Mobility  Bed Mobility Overal bed mobility: Needs Assistance Bed Mobility: Supine to Sit     Supine to sit: Mod assist     General bed mobility comments: cuing for larger-scale, exaggerated movements; difficulty dissociating LEs from trunk    Transfers Overall transfer level: Needs assistance Equipment used: Rolling walker (2 wheels) Transfers: Sit to/from Stand, Bed to chair/wheelchair/BSC Sit to Stand: Mod assist, Min assist Stand pivot transfers: Mod assist, Min assist         General transfer comment: assist for forward trunk lean, anterior weight translation, lift off and initial standing balance; cuing for larger, more purposeful steps (versus festinating steps).  Additional mobilization efforts deferred due to symptomatic orthostasis    Ambulation/Gait                   Stairs             Wheelchair Mobility    Modified Rankin (Stroke Patients Only)       Balance Overall balance assessment: Needs assistance Sitting-balance support: No upper extremity supported, Feet supported Sitting balance-Leahy Scale: Good     Standing balance support: Bilateral upper extremity supported Standing balance-Leahy Scale: Poor                              Cognition Arousal/Alertness: Suspect due to medications (drowsy, closes eyes if not directly spoken to) Behavior During Therapy: [facility] for tasks assessed/performed                                            Exercises      General Comments  Pertinent Vitals/Pain Pain Assessment Pain Assessment: No/denies pain    Home Living                          Prior Function            PT Goals (current goals can now be found in the care plan section) Acute Rehab PT Goals Patient Stated Goal: get better PT Goal Formulation: With patient Time For Goal Achievement: 08/06/22 Potential to Achieve Goals:  Fair Progress towards PT goals: Progressing toward goals    Frequency    Min 2X/week      PT Plan Current plan remains appropriate    Co-evaluation              AM-PAC PT "6 Clicks" Mobility   Outcome Measure  Help needed turning from your back to your side while in a flat bed without using bedrails?: A Little Help needed moving from lying on your back to sitting on the side of a flat bed without using bedrails?: A Lot Help needed moving to and from a bed to a chair (including a wheelchair)?: A Lot Help needed standing up from a chair using your arms (e.g., wheelchair or bedside chair)?: A Lot Help needed to walk in hospital room?: A Lot Help needed climbing 3-5 steps with a railing? : A Lot 6 Click Score: 13    End of Session Equipment Utilized During Treatment: Gait belt Activity Tolerance: Patient tolerated treatment well Patient left: in chair;with chair alarm set;with call bell/phone within reach Nurse Communication: Mobility status PT Visit Diagnosis: Unsteadiness on feet (R26.81);Difficulty in walking, not elsewhere classified (R26.2);Muscle weakness (generalized) (M62.81);Other abnormalities of gait and mobility (R26.89)     Time: 6283-6629 PT Time Calculation (min) (ACUTE ONLY): 27 min  Charges:  $Therapeutic Activity: 23-37 mins                    Ruari Duggan H. Manson Passey, PT, DPT, NCS 07/27/22, 4:28 PM [phone]

## 2022-07-27 NOTE — Progress Notes (Signed)
To Whom It May Concern:  Please be advised that the above-named patient will require a short-term nursing home stay - anticipated 30 days or less for rehabilitation and strengthening.  The plan is for return home.  

## 2022-07-27 NOTE — Progress Notes (Signed)
Occupational Therapy Treatment Patient Details Name: Corey Morrison MRN: [id number] DOB: 1956-02-24 Today's Date: 07/27/2022   History of present illness Pt is a 66 y/o M admitted on 07/21/22 after presenting to the ED with c/o acute onset of worsening generalized weakness in BLE & subsequent difficulty with ambulation. PMH: parkinson's disease, CAD, dyslipidemia, anxiety, vit B12 deficiency, a-fib   OT comments  Corey Morrison was seen for OT treatment on this date. Upon arrival to room pt reclined in chair, eager for tx. Pt requires MIN A + RW for toielt t/f in room - difficulty navigating tight spaces, improved to CGA + RW for hallway mobility navigation. CGA hand hygiene in standing, no UE support. MAX A don B shoes in sitting. Pt making good progress toward goals, will continue to follow POC. Discharge recommendation remains appropriate.     Recommendations for follow up therapy are one component of a multi-disciplinary discharge planning process, led by the attending physician.  Recommendations may be updated based on patient status, additional functional criteria and insurance authorization.    Follow Up Recommendations  Skilled nursing-short term rehab (<3 hours/day)    Assistance Recommended at Discharge Intermittent Supervision/Assistance  Patient can return home with the following  A little help with bathing/dressing/bathroom;A lot of help with walking and/or transfers   Equipment Recommendations  BSC/3in1    Recommendations for Other Services      Precautions / Restrictions Precautions Precautions: Fall Restrictions Weight Bearing Restrictions: No       Mobility Bed Mobility Overal bed mobility: Needs Assistance Bed Mobility: Sit to Supine       Sit to supine: Mod assist   General bed mobility comments: assist to initiate    Transfers Overall transfer level: Needs assistance Equipment used: Rolling walker (2 wheels) Transfers: Sit to/from Stand Sit to Stand: Min  assist                 Balance Overall balance assessment: Needs assistance Sitting-balance support: No upper extremity supported, Feet supported Sitting balance-Leahy Scale: Fair     Standing balance support: During functional activity, No upper extremity supported Standing balance-Leahy Scale: Fair                             ADL either performed or assessed with clinical judgement   ADL Overall ADL's : Needs assistance/impaired                                       General ADL Comments: MIN A + RW for toielt t/f in room - difficulty navigating tight spaces, improved to CGA + RW for hallway mobility navigation. CGA hand hygiene in standing, no UE support. MAX A don B shoes in sitting      Cognition Arousal/Alertness: Awake/alert Behavior During Therapy: WFL for tasks assessed/performed Overall Cognitive Status: Impaired/Different from baseline Area of Impairment: Safety/judgement, Orientation                 Orientation Level: Time       Safety/Judgement: Decreased awareness of safety                         Pertinent Vitals/ Pain       Pain Assessment Pain Assessment: No/denies pain   Frequency  Min 2X/week        Progress Toward  Goals  OT Goals(current goals can now be found in the care plan section)  Progress towards OT goals: Progressing toward goals  Acute Rehab OT Goals Patient Stated Goal: to return to PLOF OT Goal Formulation: With patient Time For Goal Achievement: 08/05/22 Potential to Achieve Goals: Good ADL Goals Pt Will Perform Grooming: standing;with modified independence Pt Will Perform Lower Body Dressing: with modified independence;sit to/from stand Pt Will Transfer to Toilet: with modified independence;ambulating;regular height toilet  Plan Discharge plan remains appropriate;Frequency remains appropriate    Co-evaluation                 AM-PAC OT "6 Clicks" Daily Activity      Outcome Measure   Help from another person eating meals?: None Help from another person taking care of personal grooming?: A Little Help from another person toileting, which includes using toliet, bedpan, or urinal?: A Lot Help from another person bathing (including washing, rinsing, drying)?: A Little Help from another person to put on and taking off regular upper body clothing?: A Little Help from another person to put on and taking off regular lower body clothing?: A Lot 6 Click Score: 17    End of Session    OT Visit Diagnosis: Other abnormalities of gait and mobility (R26.89);Muscle weakness (generalized) (M62.81)   Activity Tolerance Patient tolerated treatment well   Patient Left in bed;with call bell/phone within reach;with bed alarm set   Nurse Communication Mobility status        Time: 8182-9937 OT Time Calculation (min): 17 min  Charges: OT General Charges $OT Visit: 1 Visit OT Treatments $Self Care/Home Management : 8-22 mins  Kathie Dike, M.S. OTR/L  07/27/22, 3:42 PM  ascom [phone]

## 2022-07-27 NOTE — Assessment & Plan Note (Signed)
Multifactorial.  Likely worsened by hospital delirium. Neurology has started him on risperidone 0.5 mg twice daily and seem to help

## 2022-07-27 NOTE — Assessment & Plan Note (Signed)
On midodrine 

## 2022-07-27 NOTE — Plan of Care (Signed)

## 2022-07-27 NOTE — Assessment & Plan Note (Addendum)
Continue gabapentin.  We will start weaning his Percocet.  I have change Percocet from every 4 hours to every 8 hours today

## 2022-07-27 NOTE — Progress Notes (Signed)
  Progress Note   Patient: Corey Morrison OXB:[id number] DOB: 10-04-1956 DOA: 07/21/2022     4 DOS: the patient was seen and examined on 07/27/2022   Brief hospital course: Corey Morrison is a 66 y.o. male with medical history significant for [name]'s disease, coronary artery disease, dyslipidemia, anxiety, vitamin B12 deficiency and atrial fibrillation, who presented to the emergency room with acute onset of worsening generalized weakness specifically in both lower extremities with subsequent significant difficulty with ambulation. Vital signs are stable, EKG showed sinus rhythm.  Chest x-ray has no acute changes. Lab test showed hemoglobin 9.9, creatinine normal. Neurology consult is obtained, patient is a placed on observation.  Also ordered PT/OT.  8/26.  Patient is seen by neurology, Sinemet dose increased. 8/30: Waiting for SNF placement, awaiting [place] PASSR   Assessment and Plan: * Unable to ambulate-resolved as of 07/27/2022 - The patient has been having progressive bilateral lower extremity weakness. - This could be related deteriorating [name]'s disease. - The patient will be admitted to a medical bed. - PT consult will be obtained. - Neurology consult will be obtained.  Will defer further radiographic evaluation and medications adjustments to neurology. - I notified Dr. Otelia Limes about the patient.  Orthostatic hypotension On midodrine  Ambulatory dysfunction Multifactorial.  PT, OT recommends SNF and TOC working on placement.  [name]'s disease [facility]) Neurology seen and appreciate the recommendation.  Continue Sinemet IR and Entacapone Stopped Sinemet ER and Requip PT and OT recommends SNF and TOC working on placement   Peripheral neuropathy Continue gabapentin.  We will start weaning his Percocet.  I have change Percocet from every 4 hours to every 8 hours today  Hallucination Multifactorial.  Likely worsened by hospital delirium. Neurology has started him on  risperidone 0.5 mg twice daily and seem to help  Depression Stopped Celexa, continue Zoloft  Vitamin B12 deficiency - continue vitamin B12  Dyslipidemia We will continue Zetia  GERD without esophagitis - We will continue PPI therapy.  Paroxysmal atrial fibrillation (HCC) In normal sinus rhythm now.  Not on anticoagulation due to history of recurrent falls        Subjective: Drowsy but follows simple commands.  Very hard of hearing  Physical Exam: Vitals:   07/26/22 1951 07/27/22 0539 07/27/22 0738 07/27/22 1537  BP: 129/75 113/63 (!) 110/59 (!) 160/83  Pulse: 68 61 72 81  Resp: 16 16 14 17   Temp: 97.7 F (36.5 C)  98 F (36.7 C) 98.3 F (36.8 C)  TempSrc:      SpO2: 98% 96% 96% 100%  Weight:      Height:       66 year old male lying in the bed comfortably without any acute distress Lungs clear to auscultation bilaterally Cardiovascular regular rate and rhythm Abdomen soft, benign Neuro: 4 out of 5 strength throughout.  No focal deficit. Skin no rash or lesion  Data Reviewed:  There are no new results to review at this time.  Family Communication: None  Disposition: Status is: Inpatient Remains inpatient appropriate because: Waiting for SNF placement.   Planned Discharge Destination: Skilled nursing facility    DVT prophylaxis-Lovenox Time spent: 35 minutes  Author: 71, MD 07/27/2022 4:42 PM  For on call review [email or web address]/[email or web address].

## 2022-07-27 NOTE — Assessment & Plan Note (Signed)
In normal sinus rhythm now.  Not on anticoagulation due to history of recurrent falls

## 2022-07-28 DIAGNOSIS — I951 Orthostatic hypotension: Secondary | ICD-10-CM | POA: Diagnosis not present

## 2022-07-28 DIAGNOSIS — G20 Parkinson's disease: Secondary | ICD-10-CM | POA: Diagnosis not present

## 2022-07-28 DIAGNOSIS — R262 Difficulty in walking, not elsewhere classified: Secondary | ICD-10-CM | POA: Diagnosis not present

## 2022-07-28 MED ORDER — MIDODRINE HCL 2.5 MG PO TABS: 7.5000 mg | ORAL_TABLET | Freq: Three times a day (TID) | ORAL | 0 refills | Status: AC

## 2022-07-28 MED ORDER — SERTRALINE HCL 100 MG PO TABS: 100.0000 mg | ORAL_TABLET | Freq: Every day | ORAL | 0 refills | Status: DC

## 2022-07-28 MED ORDER — RISPERIDONE 0.5 MG PO TABS: ORAL_TABLET | ORAL | 0 refills | Status: DC

## 2022-07-28 MED ORDER — CARBIDOPA-LEVODOPA 25-100 MG PO TABS: 1.0000 | ORAL_TABLET | Freq: Every day | ORAL | 0 refills | Status: DC

## 2022-07-28 MED ORDER — ENTACAPONE 200 MG PO TABS: 200.0000 mg | ORAL_TABLET | Freq: Every day | ORAL | 0 refills | Status: DC

## 2022-07-28 MED ORDER — GABAPENTIN 100 MG PO CAPS: 200.0000 mg | ORAL_CAPSULE | Freq: Three times a day (TID) | ORAL | 0 refills | Status: DC

## 2022-07-28 NOTE — TOC Progression Note (Addendum)
Transition of Care [facility]) - Progression Note    Patient Details  Name: Corey Morrison MRN: [id number] Date of Birth: 1955/12/21  Transition of Care [facility]) CM/SW Contact  Truddie Hidden, RN Phone Number: 07/28/2022, 11:24 AM  Clinical Narrative:    Lorenza Cambridge issued [id number] E 8/31-9/30. Spoke with Verlon Au at [facility]. Patient will be accepted today. MD notified.  Attempt to contact Rick, patient's POA. No answer Unable to leave a message.        Expected Discharge Plan and Services                                                 Social Determinants of Health (SDOH) Interventions    Readmission Risk Interventions    07/23/2022    4:25 PM  Readmission Risk Prevention Plan  Transportation Screening Complete  Medication Review (RN Care Manager) Complete  PCP or Specialist appointment within 3-5 days of discharge Complete  HRI or Home Care Consult Complete  SW Recovery Care/Counseling Consult Complete  Palliative Care Screening Complete  Skilled Nursing Facility Complete

## 2022-07-28 NOTE — Assessment & Plan Note (Signed)
Multifactorial.  Likely worsened by hospital delirium. Continue risperidone 0.5 mg twice daily

## 2022-07-28 NOTE — Progress Notes (Signed)
  Progress Note   Patient: Corey Morrison IRJ:[id number] DOB: 21-Feb-1956 DOA: 07/21/2022     5 DOS: the patient was seen and examined on 07/28/2022   Brief hospital course: Corey Morrison is a 66 y.o. male with medical history significant for [name]'s disease, coronary artery disease, dyslipidemia, anxiety, vitamin B12 deficiency and atrial fibrillation, who presented to the emergency room with acute onset of worsening generalized weakness specifically in both lower extremities with subsequent significant difficulty with ambulation. Vital signs are stable, EKG showed sinus rhythm.  Chest x-ray has no acute changes. Lab test showed hemoglobin 9.9, creatinine normal. Neurology consult is obtained, patient is a placed on observation.  Also ordered PT/OT.  8/26.  Patient is seen by neurology, Sinemet dose increased. 8/30-8/31: Waiting for SNF placement, awaiting [place] PASSR   Assessment and Plan: * Unable to ambulate-resolved as of 07/27/2022 - The patient has been having progressive bilateral lower extremity weakness. - This could be related deteriorating [name]'s disease. - The patient will be admitted to a medical bed. - PT consult will be obtained. - Neurology consult will be obtained.  Will defer further radiographic evaluation and medications adjustments to neurology. - I notified Dr. Otelia Limes about the patient.  Orthostatic hypotension Continue midodrine  Ambulatory dysfunction Multifactorial -likely [name]'s contributing beyond severe peripheral neuropathy.  PT, OT recommends SNF and TOC working on placement.  [name]'s disease [facility]) Neurology seen and appreciate the recommendation.  Continue Sinemet IR and Entacapone. Stopped Sinemet ER and Requip PT and OT recommends SNF and TOC working on placement.   Peripheral neuropathy Increase gabapentin to 200 mg p.o. 3 times daily.  Continue weaning his Percocet as able. I have change Percocet from every 4 hours to every 8  hours.  Hallucination Multifactorial.  Likely worsened by hospital delirium. Continue risperidone 0.5 mg twice daily   Depression Stopped Celexa, continue Zoloft  Vitamin B12 deficiency - continue vitamin B12.  Dyslipidemia We will continue Zetia  GERD without esophagitis Continue Protonix  Paroxysmal atrial fibrillation (HCC) In normal sinus rhythm now.  Not on anticoagulation due to history of recurrent falls.        Subjective: Remains about same.  No new issues, difficulty hearing  Physical Exam: Vitals:   07/27/22 1537 07/27/22 2105 07/28/22 0405 07/28/22 0719  BP: (!) 160/83 104/67 110/64 114/60  Pulse: 81 65 67 64  Resp: 17 17 17 14   Temp: 98.3 F (36.8 C) (!) 97.3 F (36.3 C) 98.3 F (36.8 C) 99.2 F (37.3 C)  TempSrc:      SpO2: 100% 97% 97% 98%  Weight:      Height:       66 year old male lying in the bed comfortably without any acute distress, hard of hearing Lungs clear to auscultation bilaterally Cardiovascular regular rate and rhythm Abdomen soft, benign Neuro: 4 out of 5 strength throughout.  No focal deficit. Skin no rash or lesion  Data Reviewed:  There are no new results to review at this time.  Family Communication: None  Disposition: Status is: Inpatient Remains inpatient appropriate because: Waiting for placement   Planned Discharge Destination: Skilled nursing facility    DVT prophylaxis-Lovenox Time spent: 25 minutes  Author: 71, MD 07/28/2022 11:05 AM  For on call review [email or web address]/[email or web address].

## 2022-07-28 NOTE — Assessment & Plan Note (Signed)
Stopped Celexa, continue Zoloft 

## 2022-07-28 NOTE — Assessment & Plan Note (Signed)
Continue midodrine  

## 2022-07-28 NOTE — Discharge Summary (Signed)
Physician Discharge Summary   Patient: Corey Morrison MRN: [id number] DOB: [birth year]  Admit date:     07/21/2022  Discharge date: 07/28/22  Discharge Physician: Delfino Lovett   PCP: [facility]   Recommendations at discharge:   Follow-up with outpatient providers as requested  Discharge Diagnoses: Active Problems:   Ambulatory dysfunction   Orthostatic hypotension   [name]'s disease (HCC)   Paroxysmal atrial fibrillation (HCC)   GERD without esophagitis   Dyslipidemia   Vitamin B12 deficiency   Depression   [name] disease, symptomatic (HCC)   Hallucination   Peripheral neuropathy  Hospital Course: Corey Morrison is a 66 y.o. male with medical history significant for [name]'s disease, coronary artery disease, dyslipidemia, anxiety, vitamin B12 deficiency and atrial fibrillation, who presented to the emergency room with acute onset of worsening generalized weakness specifically in both lower extremities with subsequent significant difficulty with ambulation. Vital signs are stable, EKG showed sinus rhythm.  Chest x-ray has no acute changes. Lab test showed hemoglobin 9.9, creatinine normal. Neurology consult is obtained, patient is a placed on observation.  Also ordered PT/OT.  8/26.  Patient is seen by neurology, Sinemet dose increased. 8/30-8/31: Waiting for SNF placement, awaiting [place] PASSR  Assessment and Plan: * Unable to ambulate Ambulatory dysfunction Multifactorial -likely [name]'s contributing beyond severe peripheral neuropathy.  PT, OT recommends SNF and and he is being discharged to [facility]  Orthostatic hypotension Continue midodrine as ordered  [name]'s disease [facility]) Neurology seen and appreciate the recommendation.  Continue Sinemet IR and Entacapone. Stopped Sinemet ER and Requip PT and OT recommends SNF  Peripheral neuropathy Increase gabapentin to 200 mg p.o. 3 times daily.  I have weaned off  Percocet  Hallucination Multifactorial.  Likely worsened by hospital delirium. Continue risperidone 0.5 mg twice daily   Depression Stopped Celexa, continue Zoloft  Vitamin B12 deficiency - continue vitamin B12.  Dyslipidemia continue Zetia  GERD without esophagitis Continue Protonix  Paroxysmal atrial fibrillation (HCC) In normal sinus rhythm now.  Not on anticoagulation due to history of recurrent falls.         Consultants: Neurology Disposition: Skilled nursing facility Diet recommendation:  Discharge Diet Orders (From admission, onward)     Start     Ordered   07/28/22 0000  Diet - low sodium heart healthy        07/28/22 1215           Carb modified diet DISCHARGE MEDICATION: Allergies as of 07/28/2022   No Known Allergies      Medication List     STOP taking these medications    citalopram 40 MG tablet Commonly known as: CELEXA   rOPINIRole 1 MG tablet Commonly known as: REQUIP       TAKE these medications    acetaminophen 325 MG tablet Commonly known as: TYLENOL Take 650 mg by mouth every 6 (six) hours as needed for mild pain or moderate pain.   aspirin EC 81 MG tablet Take 81 mg by mouth daily.   Biofreeze 4 % Gel Generic drug: Menthol (Topical Analgesic) SMARTSIG:sparingly Topical 3 Times Daily   capsicum 0.075 % topical cream Commonly known as: ZOSTRIX Apply 1 Application topically 2 (two) times daily. (Apply to feet and thighs)   carbidopa-levodopa 25-100 MG tablet Commonly known as: SINEMET IR Take 1 tablet by mouth 6 (six) times daily. What changed:  how much to take when to take this additional instructions Another medication with the same name was removed. Continue taking this  medication, and follow the directions you see here.   cyanocobalamin 1000 MCG tablet Commonly known as: VITAMIN B12 Take 1,000 mcg by mouth daily.   entacapone 200 MG tablet Commonly known as: COMTAN Take 1 tablet (200 mg total) by  mouth 6 (six) times daily. What changed:  when to take this additional instructions   esomeprazole 20 MG capsule Commonly known as: NEXIUM Take 20 mg by mouth daily.   ezetimibe 10 MG tablet Commonly known as: ZETIA Take 10 mg by mouth daily.   fluticasone 50 MCG/ACT nasal spray Commonly known as: FLONASE Place 1 spray into both nostrils daily.   gabapentin 100 MG capsule Commonly known as: NEURONTIN Take 2 capsules (200 mg total) by mouth 3 (three) times daily.   melatonin 5 MG Tabs Take 1 tablet (5 mg total) by mouth at bedtime.   midodrine 2.5 MG tablet Commonly known as: PROAMATINE Take 3 tablets (7.5 mg total) by mouth 3 (three) times daily. What changed:  medication strength how much to take when to take this   polyethylene glycol 17 g packet Commonly known as: MIRALAX / GLYCOLAX Take 17 g by mouth daily as needed for mild constipation.   psyllium 0.52 g capsule Commonly known as: REGULOID Take 0.52 g by mouth at bedtime.   risperiDONE 0.5 MG tablet Commonly known as: RISPERDAL TAKE 1 TABLET BY MOUTH 2 TIMES PER DAY What changed:  how much to take how to take this when to take this additional instructions   sertraline 100 MG tablet Commonly known as: ZOLOFT Take 1 tablet (100 mg total) by mouth daily.   Vitamin D 50 MCG (2000 UT) Caps Take 2,000 Units by mouth daily.        Contact information for after-discharge care     Destination     [facility] Preferred SNF .   Service: Skilled Nursing Contact information: [address] [place] [address] [phone]                    Discharge Exam: Ceasar Mons Weights   07/21/22 1751 07/22/22 0055  Weight: 63.5 kg 44.52 kg   66 year old male lying in the bed comfortably without any acute distress, hard of hearing Lungs clear to auscultation bilaterally Cardiovascular regular rate and  rhythm Abdomen soft, benign Neuro: 4 out of 5 strength throughout.  No focal deficit. Skin no rash or lesion  Condition at discharge: fair  The results of significant diagnostics from this hospitalization (including imaging, microbiology, ancillary and laboratory) are listed below for reference.   Imaging Studies: DG Chest Portable 1 View  Result Date: 07/22/2022 CLINICAL DATA:  Weakness EXAM: PORTABLE CHEST 1 VIEW COMPARISON:  07/08/2022 FINDINGS: Heart and mediastinal contours are within normal limits. No focal opacities or effusions. No acute bony abnormality. IMPRESSION: No active disease. Electronically Signed   By: Charlett Nose M.D.   On: 07/22/2022 01:26   DG Chest Portable 1 View  Result Date: 07/08/2022 CLINICAL DATA:  Dizziness and weakness.  Atrial fibrillation. EXAM: PORTABLE CHEST 1 VIEW COMPARISON:  07/07/2022 FINDINGS: The heart size and mediastinal contours are within normal limits. Both lungs are clear. The visualized skeletal structures are unremarkable. IMPRESSION: No active disease. Electronically Signed   By: Danae Orleans M.D.   On: 07/08/2022 19:19   DG Chest Port 1 View  Result Date: 07/07/2022 CLINICAL DATA:  Hypotension.  Dizziness. EXAM: PORTABLE CHEST 1 VIEW COMPARISON:  Chest 06/22/2022 FINDINGS: Heart size and vascularity normal. Lungs clear without infiltrate or effusion. No interval change. Left coronary artery stent. Chronic fracture right seventh rib unchanged. IMPRESSION: No active disease. Electronically Signed   By: Marlan Palau M.D.   On: 07/07/2022 11:50    Microbiology: Results for orders placed or performed during the hospital encounter of 07/21/22  SARS Coronavirus 2 by RT PCR (hospital order, performed in [facility] hospital lab) *cepheid single result test* Anterior Nasal Swab     Status: None   Collection Time: 07/22/22  9:16 AM   Specimen: Anterior Nasal Swab  Result Value Ref Range Status   SARS Coronavirus 2 by RT PCR NEGATIVE NEGATIVE  Final    Comment: (NOTE) SARS-CoV-2 target nucleic acids are NOT DETECTED.  The SARS-CoV-2 RNA is generally detectable in upper and lower respiratory specimens during the acute phase of infection. The lowest concentration of SARS-CoV-2 viral copies this assay can detect is 250 copies / mL. A negative result does not preclude SARS-CoV-2 infection and should not be used as the sole basis for treatment or other patient management decisions.  A negative result may occur with improper specimen collection / handling, submission of specimen other than nasopharyngeal swab, presence of viral mutation(s) within the areas targeted by this assay, and inadequate number of viral copies (<250 copies / mL). A negative result must be combined with clinical observations, patient history, and epidemiological information.  Fact Sheet for Patients:   RoadLapTop.co.za  Fact Sheet for Healthcare Providers: http://kim-Priola.com/  This test is not yet approved or  cleared by the [place] FDA and has been authorized for detection and/or diagnosis of SARS-CoV-2 by FDA under an Emergency Use Authorization (EUA).  This EUA will remain in effect (meaning this test can be used) for the duration of the COVID-19 declaration under Section 564(b)(1) of the Act, 21 U.S.C. section 360bbb-3(b)(1), unless the authorization is terminated or revoked sooner.  Performed at [facility], [address] [place], [place] [address]   Urine Culture     Status: None   Collection Time: 07/25/22  7:33 PM   Specimen: Urine, Clean Catch  Result Value Ref Range Status   Specimen Description   Final    URINE, CLEAN CATCH Performed at [facility], [address] [place], [place] [address]    Special Requests   Final    NONE Performed at [facility], [address] [place], [place] [address]    Culture   Final    NO GROWTH Performed at [facility], [address], [place], [place] [address]    Report Status 07/27/2022 FINAL  Final    Labs: CBC: Recent Labs  Lab 07/21/22 1800 07/22/22 0606 07/23/22 0452 07/26/22 0506  WBC 10.3 8.6 8.3 9.3  HGB 10.9* 9.9* 10.7* 10.3*  HCT 34.9* 30.9* 32.9* 32.8*  MCV 91.8 90.6 89.6 91.6  PLT 208 190 181 189   Basic Metabolic Panel: Recent Labs  Lab 07/21/22 1800 07/22/22 0606 07/26/22 0506  NA 139 143 141  K 4.2 4.1 4.5  CL 104 108 106  CO2 29 30 31   GLUCOSE 128* 96 92  BUN 29* 26* 36*  CREATININE 1.15 1.06 0.94  CALCIUM 9.0 8.5* 9.0  MG  --   --  2.5*   Liver Function Tests: No results for input(s): "AST", "ALT", "ALKPHOS", "BILITOT", "PROT", "ALBUMIN" in the last 168 hours. CBG: No results for input(s): "GLUCAP" in the last 168  hours.  Discharge time spent: greater than 30 minutes.  Signed: Delfino Lovett, MD [facility] 07/28/2022

## 2022-07-28 NOTE — Assessment & Plan Note (Signed)
Neurology seen and appreciate the recommendation.  Continue Sinemet IR and Entacapone. Stopped Sinemet ER and Requip PT and OT recommends SNF and TOC working on placement.

## 2022-07-28 NOTE — Assessment & Plan Note (Addendum)
Increase gabapentin to 200 mg p.o. 3 times daily.  Continue weaning his Percocet as able. I have change Percocet from every 4 hours to every 8 hours.

## 2022-07-28 NOTE — TOC Transition Note (Signed)
Transition of Care [facility]) - CM/SW Discharge Note   Patient Details  Name: Corey Morrison MRN: [id number] Date of Birth: [birth year]  Transition of Care [facility]) CM/SW Contact:  Truddie Hidden, RN Phone Number: 07/28/2022, 1:40 PM   Clinical Narrative:    Sherron Monday with [name], Patient's POA. [name] stated he would find transportation for patient. Discharge summary sent in HUB. Nurse and MD notified. TOC signing off.          Patient Goals and CMS Choice        Discharge Placement                       Discharge Plan and Services                                     Social Determinants of Health (SDOH) Interventions     Readmission Risk Interventions    07/23/2022    4:25 PM  Readmission Risk Prevention Plan  Transportation Screening Complete  Medication Review (RN Care Manager) Complete  PCP or Specialist appointment within 3-5 days of discharge Complete  HRI or Home Care Consult Complete  SW Recovery Care/Counseling Consult Complete  Palliative Care Screening Complete  Skilled Nursing Facility Complete

## 2022-07-28 NOTE — Progress Notes (Signed)
Report called to Amy, RN at [facility]. Will assist patient with getting dressed. PIV removed. Patients POA to pick patient up when ready to drive [facility] to [facility].

## 2022-07-28 NOTE — Progress Notes (Signed)
[facility]) Hospital Liaison note:  Notified via Epic workque from Dr. Delfino Lovett of request for [facility] Palliative Care services. This Clinical research associate has attempted to call POA without success since the voicemail box is full. Will continue to try to make contact. Will also continue to follow for disposition.  Please call with any outpatient palliative questions or concerns.  Thank you for the opportunity to participate in this patient's care.  Thank you, Abran Cantor, LPN [facility] Liaison

## 2022-07-28 NOTE — Assessment & Plan Note (Signed)
Multifactorial -likely [name]'s contributing beyond severe peripheral neuropathy.  PT, OT recommends SNF and TOC working on placement.

## 2022-07-28 NOTE — Assessment & Plan Note (Signed)
In normal sinus rhythm now.  Not on anticoagulation due to history of recurrent falls 

## 2022-07-28 NOTE — Assessment & Plan Note (Signed)
continue vitamin B12. ?

## 2022-07-28 NOTE — Assessment & Plan Note (Signed)
Continue Protonix °

## 2022-09-06 ENCOUNTER — Emergency Department
Admission: EM | Admit: 2022-09-06 | Discharge: 2022-09-06 | Discharge status: Against Medical Advice | Payer: Medicare Other | Attending: Emergency Medicine | Admitting: Emergency Medicine

## 2022-09-06 ENCOUNTER — Encounter: Payer: Self-pay | Admitting: Emergency Medicine

## 2022-09-06 DIAGNOSIS — R2689 Other abnormalities of gait and mobility: Secondary | ICD-10-CM | POA: Diagnosis present

## 2022-09-06 DIAGNOSIS — G20C Parkinsonism, unspecified: Secondary | ICD-10-CM | POA: Diagnosis not present

## 2022-09-06 DIAGNOSIS — Z5321 Procedure and treatment not carried out due to patient leaving prior to being seen by health care provider: Secondary | ICD-10-CM | POA: Insufficient documentation

## 2022-09-06 LAB — CBC WITH DIFFERENTIAL/PLATELET
Abs Immature Granulocytes: 0.03 10*3/uL (ref 0.00–0.07)
Basophils Absolute: 0 10*3/uL (ref 0.0–0.1)
Basophils Relative: 0 %
Eosinophils Absolute: 0.1 10*3/uL (ref 0.0–0.5)
Eosinophils Relative: 1 %
HCT: 32.4 % — ABNORMAL LOW (ref 39.0–52.0)
Hemoglobin: 10.2 g/dL — ABNORMAL LOW (ref 13.0–17.0)
Immature Granulocytes: 0 %
Lymphocytes Relative: 12 %
Lymphs Abs: 1 10*3/uL (ref 0.7–4.0)
MCH: 28.9 pg (ref 26.0–34.0)
MCHC: 31.5 g/dL (ref 30.0–36.0)
MCV: 91.8 fL (ref 80.0–100.0)
Monocytes Absolute: 0.6 10*3/uL (ref 0.1–1.0)
Monocytes Relative: 8 %
Neutro Abs: 6.3 10*3/uL (ref 1.7–7.7)
Neutrophils Relative %: 79 %
Platelets: 207 10*3/uL (ref 150–400)
RBC: 3.53 MIL/uL — ABNORMAL LOW (ref 4.22–5.81)
RDW: 15 % (ref 11.5–15.5)
WBC: 8.1 10*3/uL (ref 4.0–10.5)
nRBC: 0 % (ref 0.0–0.2)

## 2022-09-06 LAB — COMPREHENSIVE METABOLIC PANEL
ALT: 9 U/L (ref 0–44)
AST: 15 U/L (ref 15–41)
Albumin: 3.7 g/dL (ref 3.5–5.0)
Alkaline Phosphatase: 66 U/L (ref 38–126)
Anion gap: 7 (ref 5–15)
BUN: 28 mg/dL — ABNORMAL HIGH (ref 8–23)
CO2: 27 mmol/L (ref 22–32)
Calcium: 9 mg/dL (ref 8.9–10.3)
Chloride: 107 mmol/L (ref 98–111)
Creatinine, Ser: 1.03 mg/dL (ref 0.61–1.24)
GFR, Estimated: >60 mL/min (ref >60)
Glucose, Bld: 103 mg/dL — ABNORMAL HIGH (ref 70–99)
Potassium: 4.3 mmol/L (ref 3.5–5.1)
Sodium: 141 mmol/L (ref 135–145)
Total Bilirubin: 0.5 mg/dL (ref 0.3–1.2)
Total Protein: 7.1 g/dL (ref 6.5–8.1)

## 2022-09-06 NOTE — ED Triage Notes (Addendum)
Pt presents via EMS with gait issues. He notes a hx of parkinsons and its "getting worse". He notes being complaint with his medications and has a PT but they recommend he come to the ED for eval. Denies falls, injury, fevers, chills, N/V/D, CP or SOB.

## 2022-09-06 NOTE — ED Provider Triage Note (Signed)
  Emergency Medicine Provider Triage Evaluation Note  Corey Morrison , a 66 y.o.male,  was evaluated in triage.  Pt complains of gait issues.  Patient states that he has a history of [name]'s and feels like his symptoms are getting worse.  He states that he is compliant with all of his medications for [name]'s.  He was seen by a physical therapist, who recommended he come to emergency department for evaluation.   Review of Systems  Positive: Gait issues Negative: Denies fever, chest pain, vomiting  Physical Exam   Vitals:   09/06/22 1924  BP: 112/74  Pulse: 87  Resp: 18  Temp: 98.8 F (37.1 C)  SpO2: 99%   Gen:   Awake, no distress   Resp:  Normal effort  MSK:   Moves extremities without difficulty  Other:    Medical Decision Making  Given the patient's initial medical screening exam, the following diagnostic evaluation has been ordered. The patient will be placed in the appropriate treatment space, once one is available, to complete the evaluation and treatment. I have discussed the plan of care with the patient and I have advised the patient that an ED physician or mid-level practitioner will reevaluate their condition after the test results have been received, as the results may give them additional insight into the type of treatment they may need.    Diagnostics: Labs  Treatments: none immediately   Teodoro Spray, [place] 09/06/22 2040

## 2022-10-17 ENCOUNTER — Emergency Department
Admission: EM | Admit: 2022-10-17 | Discharge: 2022-10-17 | Disposition: A | Discharge status: Home / Self Care | Payer: Medicare Other | Attending: Emergency Medicine | Admitting: Emergency Medicine

## 2022-10-17 ENCOUNTER — Emergency Department: Payer: Medicare Other

## 2022-10-17 ENCOUNTER — Other Ambulatory Visit: Payer: Self-pay

## 2022-10-17 DIAGNOSIS — R55 Syncope and collapse: Secondary | ICD-10-CM | POA: Diagnosis not present

## 2022-10-17 DIAGNOSIS — I447 Left bundle-branch block, unspecified: Secondary | ICD-10-CM | POA: Diagnosis not present

## 2022-10-17 DIAGNOSIS — Z1152 Encounter for screening for COVID-19: Secondary | ICD-10-CM | POA: Diagnosis not present

## 2022-10-17 DIAGNOSIS — R0602 Shortness of breath: Secondary | ICD-10-CM | POA: Diagnosis not present

## 2022-10-17 DIAGNOSIS — R42 Dizziness and giddiness: Secondary | ICD-10-CM | POA: Diagnosis present

## 2022-10-17 LAB — CBC
HCT: 33.3 % — ABNORMAL LOW (ref 39.0–52.0)
Hemoglobin: 10.6 g/dL — ABNORMAL LOW (ref 13.0–17.0)
MCH: 28.8 pg (ref 26.0–34.0)
MCHC: 31.8 g/dL (ref 30.0–36.0)
MCV: 90.5 fL (ref 80.0–100.0)
Platelets: 190 10*3/uL (ref 150–400)
RBC: 3.68 MIL/uL — ABNORMAL LOW (ref 4.22–5.81)
RDW: 15 % (ref 11.5–15.5)
WBC: 10.8 10*3/uL — ABNORMAL HIGH (ref 4.0–10.5)
nRBC: 0 % (ref 0.0–0.2)

## 2022-10-17 LAB — BASIC METABOLIC PANEL
Anion gap: 7 (ref 5–15)
BUN: 23 mg/dL (ref 8–23)
CO2: 27 mmol/L (ref 22–32)
Calcium: 9 mg/dL (ref 8.9–10.3)
Chloride: 110 mmol/L (ref 98–111)
Creatinine, Ser: 0.95 mg/dL (ref 0.61–1.24)
GFR, Estimated: >60 mL/min (ref >60)
Glucose, Bld: 106 mg/dL — ABNORMAL HIGH (ref 70–99)
Potassium: 4.2 mmol/L (ref 3.5–5.1)
Sodium: 144 mmol/L (ref 135–145)

## 2022-10-17 LAB — RESP PANEL BY RT-PCR (FLU A&B, COVID) ARPGX2
Influenza A by PCR: NEGATIVE
Influenza B by PCR: NEGATIVE
SARS Coronavirus 2 by RT PCR: NEGATIVE

## 2022-10-17 MED ORDER — SODIUM CHLORIDE 0.9 % IV BOLUS
500.0000 mL | Freq: Once | INTRAVENOUS | Status: AC
  Administered 2022-10-17: 500 mL via INTRAVENOUS

## 2022-10-17 NOTE — ED Notes (Signed)
Updated Rick, patient's POA with patient's permission.

## 2022-10-17 NOTE — ED Provider Notes (Signed)
.     Sharyn Creamer, MD 10/17/22 1057

## 2022-10-17 NOTE — ED Notes (Signed)
Transportation from [place] to pick up patient around 1235  Updated patient  Pt expressing concern for a deep cough, requesting covid testing - notified provider, see new orders.

## 2022-10-17 NOTE — Discharge Instructions (Addendum)
Your test today appear quite reassuring.  I suspect he may have had an episode of low blood pressure possibly related to your [name]'s disease.  Please continue to follow-up with doctors making house calls, and also with Dr. Malvin Johns of neurology.  Please come back to the emergency room right away if you have trouble breathing, develop chest pain, passout, have a fever, develop abdominal pain or other new concerns or symptoms arise.

## 2022-10-17 NOTE — ED Provider Notes (Signed)
Flu and covid negative   Sharyn Creamer, MD 10/17/22 1541

## 2022-10-17 NOTE — ED Notes (Signed)
Patient dressed and brief changed at discharge. BP slightly elevated, Dr. Fanny Bien aware and IS OK with discharge.   Wheelchair to lobby and assisted into [place] back to facility with Windell Moulding, driver.   Discussed discharge with patient and with facility staff member, Amalia Hailey.

## 2022-10-17 NOTE — ED Provider Notes (Signed)
[facility] Provider Note    Event Date/Time   First MD Initiated Contact with Patient 10/17/22 [phone]     (approximate)   History   Episode of weakness  HPI  Corey Morrison is a 66 y.o. male who has a history of notable [name]'s disease atrial fibrillation  He resides at [place] [facility] [address]  Tells me that today he was getting assessed and they were getting ready to transport him today to get an MRI that his neurologist had ordered just a few days ago.  As he was preparing he suddenly started to feel lightheaded and fatigued.  It lasted perhaps may be a few minutes and went away.  Tells me has had the same thing happen many many times, and they always tell him its related to his blood pressure and [name]'s disease.  He reports right now he feels perfectly fine.  He felt a like slightly short of breath when it happened and a little lightheaded, but it lasted only briefly and he feels much better now.  Reports issues with blood pressure tied to his [name]'s that have caused same in the past.  Denies recent illness but his [name]'s continues to progress, he has an MRI scheduled with neurology.  He suffers occasional hallucinations, occasional episodes of weakness, has been progressively becoming slowly more weak in his lower legs over the course of months to years and is now to the point that he is almost needing a wheelchair   No chest pain.  No leg swelling.  Did not fall or become injured but he reports he stumbled yesterday reaching for something and did fall and struck his head but suffered no injury from it.  He has a small scab on his nose but reports that is from him scratching and picking at a small area not from an injury  Physical Exam   Triage Vital Signs: ED Triage Vitals [10/17/22 0944]  Enc Vitals Group     BP 132/71     Pulse Rate 64     Resp 20     Temp 98.4 F (36.9 C)     Temp Source Oral     SpO2 98 %     Weight      Height       Head Circumference      Peak Flow      Pain Score 0     Pain Loc      Pain Edu?      Excl. in GC?     Most recent vital signs: Vitals:   10/17/22 0944  BP: 132/71  Pulse: 64  Resp: 20  Temp: 98.4 F (36.9 C)  SpO2: 98%     General: Awake, no distress.  Normocephalic atraumatic.  A small scab over the anterior nose without surrounding erythema CV:  Good peripheral perfusion.  Normal heart tones and rate Resp:  Normal effort.  Clear bilaterally Abd:  No distention.  Soft nontender nondistended Other:  Moves all extremities, [name]'s-like features including facial movements, his cognition is clear though he is oriented to the president being Duane Boston, the month being November, acknowledges that sometimes he struggles a little bit to keep track of what year it is, but knows its roughly 2023     ED Results / Procedures / Treatments   Labs (all labs ordered are listed, but only abnormal results are displayed) Labs Reviewed  CBC - Abnormal; Notable for the following components:  Result Value   WBC 10.8 (*)    RBC 3.68 (*)    Hemoglobin 10.6 (*)    HCT 33.3 (*)    All other components within normal limits  BASIC METABOLIC PANEL - Abnormal; Notable for the following components:   Glucose, Bld 106 (*)    All other components within normal limits     EKG  And interpreted by me at 940 heart rate 70 QRS 140 QTc 510 Normal sinus rhythm.  Left bundle branch block.  Compared to previous EKG from August 24 of this year no noted changes found.   RADIOLOGY  CT head interpreted by me as negative for acute intracranial hemorrhage, negative for acute gross pathology  CXR no acute disease noted    PROCEDURES:  Critical Care performed: No  Procedures   MEDICATIONS ORDERED IN ED: Medications  sodium chloride 0.9 % bolus 500 mL (500 mLs Intravenous New Bag/Given 10/17/22 1020)     IMPRESSION / MDM / ASSESSMENT AND PLAN / ED COURSE  I reviewed the triage vital  signs and the nursing notes.                              Patient evidently was hypotensive and had a low oxygen saturation while at the nursing facility.  However, the patient remarkably has normalized his vital signs at this point.  He reports he had no chest pain, no headache, nothing out of the usual except for this brief episode lasting a few minutes of feeling fatigued lightheaded.  He reports the same occurring multiple times in the past.  Based on the clinical history he provides in his medical history I suspect he may be having vasovagal episodes orthostatic symptoms as he reports he was preparing to get ready to go get an MRI which occurred.    Differential diagnosis includes, but is not limited to, orthostatic hypotension, vasovagal episode, arrhythmia, anemia, electrolyte abnormality or imbalance etc.  Given he had a fall yesterday will also obtain a CT of the head to exclude intracranial hemorrhage though this seems to be low pretest probability.  We will continue to monitor him here, but his vital signs are all normal and he is completely asymptomatic.  I do not find any exam findings or clinical history to suggest that he would have had a pulmonary embolism, dissection, ACS, acute vascular emergency, hemorrhage, stroke or central neurologic cause etc.  Patient reports he is his own medical decision maker.  His friend serve as his contacts.  I discussed with him and he reports this happened to him many times the past, he is very comfortable with the plan that if he has initial testing here that is unrevealing for concerning cause that he would like to return back to his care facility today.  I think this is quite reasonable.  Patient's presentation is most consistent with acute presentation with potential threat to life or bodily function.  The patient is on the cardiac monitor to evaluate for evidence of arrhythmia and/or significant heart rate  changes.  ----------------------------------------- 11:00 AM on 10/17/2022 -----------------------------------------   Resting comfortably at this time.  Reviewed results with patient is comfortable with plan to return to his care facility.  Advises that he can go via wheelchair Zenaida Niece, or he can have a friend come pick him up.  Return precautions and treatment recommendations and follow-up discussed with the patient who is agreeable with the plan.  FINAL CLINICAL IMPRESSION(S) / ED DIAGNOSES   Final diagnoses:  Vasovagal episode  Intermittent lightheadedness     Rx / DC Orders   ED Discharge Orders     None        Note:  This document was prepared using Dragon voice recognition software and may include unintentional dictation errors.   Sharyn Creamer, MD 10/17/22 1101

## 2022-10-17 NOTE — ED Notes (Signed)
Discussed discharge instructions with Amalia Hailey, facility staff member at [facility].

## 2022-10-17 NOTE — ED Notes (Signed)
MD at bedside. 

## 2022-10-17 NOTE — ED Triage Notes (Addendum)
BIBA from [place] of [address]   During morning rounds facility staff reports patient appeared pale and was SOB. Room air saturations found to be in the 80s for EMS, initially placed on NRB. Titrated down to room air upon arrival to ER with saturations in mid 90s.   Initially hypotensive with BP 71/52 for EMS - arrives normotensive with BP 132/71  Possible unwitnessed fall yesterday, patient unsure if he struck head.   Pt alert and oriented x 3 (baseline for patient).   PMH afib.

## 2022-11-06 ENCOUNTER — Emergency Department: Payer: Medicare Other

## 2022-11-06 ENCOUNTER — Other Ambulatory Visit: Payer: Self-pay

## 2022-11-06 ENCOUNTER — Emergency Department
Admission: EM | Admit: 2022-11-06 | Discharge: 2022-11-06 | Disposition: A | Discharge status: Home / Self Care | Payer: Medicare Other | Attending: Emergency Medicine | Admitting: Emergency Medicine

## 2022-11-06 DIAGNOSIS — Z20822 Contact with and (suspected) exposure to covid-19: Secondary | ICD-10-CM | POA: Diagnosis not present

## 2022-11-06 DIAGNOSIS — G20B1 Parkinson's disease with dyskinesia, without mention of fluctuations: Secondary | ICD-10-CM

## 2022-11-06 DIAGNOSIS — R531 Weakness: Secondary | ICD-10-CM | POA: Diagnosis present

## 2022-11-06 LAB — COMPREHENSIVE METABOLIC PANEL
ALT: 5 U/L (ref 0–44)
AST: 14 U/L — ABNORMAL LOW (ref 15–41)
Albumin: 3.6 g/dL (ref 3.5–5.0)
Alkaline Phosphatase: 59 U/L (ref 38–126)
Anion gap: 6 (ref 5–15)
BUN: 25 mg/dL — ABNORMAL HIGH (ref 8–23)
CO2: 27 mmol/L (ref 22–32)
Calcium: 8.8 mg/dL — ABNORMAL LOW (ref 8.9–10.3)
Chloride: 108 mmol/L (ref 98–111)
Creatinine, Ser: 0.88 mg/dL (ref 0.61–1.24)
GFR, Estimated: >60 mL/min (ref >60)
Glucose, Bld: 98 mg/dL (ref 70–99)
Potassium: 4 mmol/L (ref 3.5–5.1)
Sodium: 141 mmol/L (ref 135–145)
Total Bilirubin: 0.5 mg/dL (ref 0.3–1.2)
Total Protein: 6.6 g/dL (ref 6.5–8.1)

## 2022-11-06 LAB — CBC WITH DIFFERENTIAL/PLATELET
Abs Immature Granulocytes: 0.03 10*3/uL (ref 0.00–0.07)
Basophils Absolute: 0 10*3/uL (ref 0.0–0.1)
Basophils Relative: 0 %
Eosinophils Absolute: 0.2 10*3/uL (ref 0.0–0.5)
Eosinophils Relative: 2 %
HCT: 33 % — ABNORMAL LOW (ref 39.0–52.0)
Hemoglobin: 10.5 g/dL — ABNORMAL LOW (ref 13.0–17.0)
Immature Granulocytes: 0 %
Lymphocytes Relative: 16 %
Lymphs Abs: 1.1 10*3/uL (ref 0.7–4.0)
MCH: 29.7 pg (ref 26.0–34.0)
MCHC: 31.8 g/dL (ref 30.0–36.0)
MCV: 93.2 fL (ref 80.0–100.0)
Monocytes Absolute: 0.5 10*3/uL (ref 0.1–1.0)
Monocytes Relative: 7 %
Neutro Abs: 5.4 10*3/uL (ref 1.7–7.7)
Neutrophils Relative %: 75 %
Platelets: 175 10*3/uL (ref 150–400)
RBC: 3.54 MIL/uL — ABNORMAL LOW (ref 4.22–5.81)
RDW: 15.2 % (ref 11.5–15.5)
WBC: 7.2 10*3/uL (ref 4.0–10.5)
nRBC: 0 % (ref 0.0–0.2)

## 2022-11-06 LAB — RESP PANEL BY RT-PCR (RSV, FLU A&B, COVID)  RVPGX2
Influenza A by PCR: NEGATIVE
Influenza B by PCR: NEGATIVE
Resp Syncytial Virus by PCR: NEGATIVE
SARS Coronavirus 2 by RT PCR: NEGATIVE

## 2022-11-06 LAB — URINALYSIS, ROUTINE W REFLEX MICROSCOPIC
Bilirubin Urine: NEGATIVE
Glucose, UA: NEGATIVE mg/dL
Hgb urine dipstick: NEGATIVE
Ketones, ur: 5 mg/dL — AB
Leukocytes,Ua: NEGATIVE
Nitrite: NEGATIVE
Protein, ur: NEGATIVE mg/dL
Specific Gravity, Urine: 1.016 (ref 1.005–1.030)
pH: 7 (ref 5.0–8.0)

## 2022-11-06 MED ORDER — LACTATED RINGERS IV BOLUS
1000.0000 mL | Freq: Once | INTRAVENOUS | Status: AC
  Administered 2022-11-06: 1000 mL via INTRAVENOUS

## 2022-11-06 MED ORDER — CARBIDOPA-LEVODOPA ER 25-100 MG PO TBCR
2.0000 | EXTENDED_RELEASE_TABLET | ORAL | Status: AC
  Administered 2022-11-06: 2 via ORAL
  Filled 2022-11-06: qty 2

## 2022-11-06 NOTE — ED Provider Notes (Signed)
[facility] Provider Note    Event Date/Time   First MD Initiated Contact with Patient 11/06/22 0827     (approximate)   History   Chief Complaint: Weakness   HPI  Corey Morrison is a 66 y.o. male with a history of paroxysmal atrial fibrillation, [name]'s disease who was brought to the ED due to having an episode of feeling generalized weakness while walking at his residential facility.  He did not fall or pass out.  He denies any acute symptoms and reports being in his usual state of health prior to this happening.  The episode was brief and he feels back to normal and denies any pain currently.  Denies head injury.  No headache or chest pain or abdominal pain.     Physical Exam   Triage Vital Signs: ED Triage Vitals  Enc Vitals Group     BP 11/06/22 0833 136/80     Pulse Rate 11/06/22 0833 72     Resp 11/06/22 0833 15     Temp 11/06/22 0840 97.8 F (36.6 C)     Temp Source 11/06/22 0840 Oral     SpO2 11/06/22 0833 99 %     Weight 11/06/22 0829 159 lb (72.1 kg)     Height 11/06/22 0829 5\' 9"  (1.753 m)     Head Circumference --      Peak Flow --      Pain Score --      Pain Loc --      Pain Edu? --      Excl. in GC? --     Most recent vital signs: Vitals:   11/06/22 0833 11/06/22 0840  BP: 136/80   Pulse: 72   Resp: 15   Temp:  97.8 F (36.6 C)  SpO2: 99%     General: Awake, no distress.  CV:  Good peripheral perfusion.  Regular rate. Resp:  Normal effort.  Clear to auscultation bilaterally Abd:  No distention.  Soft nontender Other:  Full range of motion all extremities.  No deformities or bony point tenderness.  No evidence of head trauma.  Alert, oriented, clear speech.   ED Results / Procedures / Treatments   Labs (all labs ordered are listed, but only abnormal results are displayed) Labs Reviewed  COMPREHENSIVE METABOLIC PANEL - Abnormal; Notable for the following components:      Result Value   BUN 25 (*)    Calcium  8.8 (*)    AST 14 (*)    All other components within normal limits  CBC WITH DIFFERENTIAL/PLATELET - Abnormal; Notable for the following components:   RBC 3.54 (*)    Hemoglobin 10.5 (*)    HCT 33.0 (*)    All other components within normal limits  URINALYSIS, ROUTINE W REFLEX MICROSCOPIC - Abnormal; Notable for the following components:   Color, Urine AMBER (*)    APPearance CLEAR (*)    Ketones, ur 5 (*)    All other components within normal limits  RESP PANEL BY RT-PCR (RSV, FLU A&B, COVID)  RVPGX2     EKG    RADIOLOGY Chest x-ray interpreted by me, appears normal.  Radiology report reviewed   PROCEDURES:  Procedures   MEDICATIONS ORDERED IN ED: Medications  lactated ringers bolus 1,000 mL (1,000 mLs Intravenous New Bag/Given 11/06/22 0833)  Carbidopa-Levodopa ER (SINEMET CR) 25-100 MG tablet controlled release 2 tablet (2 tablets Oral Given 11/06/22 0841)     IMPRESSION / MDM / ASSESSMENT  AND PLAN / ED COURSE  I reviewed the triage vital signs and the nursing notes.                              Differential diagnosis includes, but is not limited to, dehydration, AKI, electrolyte abnormality, anemia, viral illness, fluctuating [name]'s symptoms, UTI, orthostatic symptoms  Patient's presentation is most consistent with acute presentation with potential threat to life or bodily function.  Patient presents with episode of generalized weakness causing him to have to sit down.  He did not fall, no trauma.  No acute pain complaints or other acute symptoms.  Vital signs and exam are reassuring.  Labs and chest x-ray are unremarkable, viral swab is negative.  He does not require admission and can be discharged back to his residential facility.       FINAL CLINICAL IMPRESSION(S) / ED DIAGNOSES   Final diagnoses:  Generalized weakness  [name]'s disease with dyskinesia, unspecified whether manifestations fluctuate     Rx / DC Orders   ED Discharge Orders      None        Note:  This document was prepared using Dragon voice recognition software and may include unintentional dictation errors.   Sharman Cheek, MD 11/06/22 [phone]

## 2022-11-06 NOTE — ED Notes (Addendum)
[facility] notified of pts discharge and report given.

## 2022-11-06 NOTE — ED Triage Notes (Signed)
Pt brought in via ems from "the [facility]". Pt was walking, felt weak, and sat down. Facility worker states pt had seizure like activity. Pt denies losing consciousness. Ems denies post ictal state. Pt denies hx of seizures. Pt denies pain.

## 2022-11-06 NOTE — Discharge Instructions (Signed)
Your lab tests and chest xray were all okay today.  Please continue taking your medications and be sure to stay well-hydrated.

## 2022-11-06 NOTE — ED Notes (Signed)
This RN spoke with pts facility again. They state pt does not have family available to transport and the facility does not have transportation on weekends.

## 2022-11-22 ENCOUNTER — Other Ambulatory Visit: Payer: Self-pay

## 2022-11-22 ENCOUNTER — Emergency Department: Payer: Medicare Other

## 2022-11-22 DIAGNOSIS — I4891 Unspecified atrial fibrillation: Secondary | ICD-10-CM | POA: Insufficient documentation

## 2022-11-22 DIAGNOSIS — S0003XA Contusion of scalp, initial encounter: Secondary | ICD-10-CM | POA: Diagnosis not present

## 2022-11-22 DIAGNOSIS — W19XXXA Unspecified fall, initial encounter: Secondary | ICD-10-CM | POA: Diagnosis not present

## 2022-11-22 DIAGNOSIS — G20C Parkinsonism, unspecified: Secondary | ICD-10-CM | POA: Diagnosis not present

## 2022-11-22 DIAGNOSIS — S0990XA Unspecified injury of head, initial encounter: Secondary | ICD-10-CM | POA: Diagnosis present

## 2022-11-22 LAB — BASIC METABOLIC PANEL
Anion gap: 7 (ref 5–15)
BUN: 32 mg/dL — ABNORMAL HIGH (ref 8–23)
CO2: 28 mmol/L (ref 22–32)
Calcium: 8.8 mg/dL — ABNORMAL LOW (ref 8.9–10.3)
Chloride: 107 mmol/L (ref 98–111)
Creatinine, Ser: 1.05 mg/dL (ref 0.61–1.24)
GFR, Estimated: >60 mL/min (ref >60)
Glucose, Bld: 103 mg/dL — ABNORMAL HIGH (ref 70–99)
Potassium: 4.5 mmol/L (ref 3.5–5.1)
Sodium: 142 mmol/L (ref 135–145)

## 2022-11-22 LAB — CBC WITH DIFFERENTIAL/PLATELET
Abs Immature Granulocytes: 0.05 10*3/uL (ref 0.00–0.07)
Basophils Absolute: 0 10*3/uL (ref 0.0–0.1)
Basophils Relative: 0 %
Eosinophils Absolute: 0.1 10*3/uL (ref 0.0–0.5)
Eosinophils Relative: 1 %
HCT: 31.7 % — ABNORMAL LOW (ref 39.0–52.0)
Hemoglobin: 10.1 g/dL — ABNORMAL LOW (ref 13.0–17.0)
Immature Granulocytes: 1 %
Lymphocytes Relative: 10 %
Lymphs Abs: 1 10*3/uL (ref 0.7–4.0)
MCH: 29.9 pg (ref 26.0–34.0)
MCHC: 31.9 g/dL (ref 30.0–36.0)
MCV: 93.8 fL (ref 80.0–100.0)
Monocytes Absolute: 0.7 10*3/uL (ref 0.1–1.0)
Monocytes Relative: 7 %
Neutro Abs: 7.6 10*3/uL (ref 1.7–7.7)
Neutrophils Relative %: 81 %
Platelets: 191 10*3/uL (ref 150–400)
RBC: 3.38 MIL/uL — ABNORMAL LOW (ref 4.22–5.81)
RDW: 15 % (ref 11.5–15.5)
WBC: 9.4 10*3/uL (ref 4.0–10.5)
nRBC: 0 % (ref 0.0–0.2)

## 2022-11-22 LAB — URINALYSIS, ROUTINE W REFLEX MICROSCOPIC
Bilirubin Urine: NEGATIVE
Glucose, UA: NEGATIVE mg/dL
Hgb urine dipstick: NEGATIVE
Ketones, ur: 5 mg/dL — AB
Leukocytes,Ua: NEGATIVE
Nitrite: NEGATIVE
Protein, ur: NEGATIVE mg/dL
Specific Gravity, Urine: 1.025 (ref 1.005–1.030)
pH: 5 (ref 5.0–8.0)

## 2022-11-22 LAB — TROPONIN I (HIGH SENSITIVITY)
Troponin I (High Sensitivity): 9 ng/L (ref <18)
Troponin I (High Sensitivity): 7 ng/L (ref <18)

## 2022-11-22 NOTE — ED Triage Notes (Signed)
Pt comes via EMs from [facility]. Pt had fall today 2nd one today. Pt states hx of parkinson. Pt is not on thinners or no loc. Pt has hematoma to back of head. Staff states pt has not been himself and falls are becoming more frequent.

## 2022-11-22 NOTE — ED Notes (Signed)
Pt assisted to bathroom to urinate as well as collect specimen ordered. Pt cleaned and provided with new dry brief. Pt assisted back into wheelchair and back out to lobby to continue awaiting room.

## 2022-11-22 NOTE — ED Provider Triage Note (Signed)
Emergency Medicine Provider Triage Evaluation Note  Corey Morrison , a 66 y.o. male  was evaluated in triage.  Pt complains of frequent falls. H/o Parkinsons. Not on thinners.  Patient denies any pain anywhere.  Reports that he is passing out and that is the reason why he is falling.  Review of Systems  Positive: Syncope Negative: Headache, neck pain, numbness, tingling, nausea, vomiting  Physical Exam  There were no vitals taken for this visit. Gen:   Awake, no distress   Resp:  Normal effort  MSK:   Moves extremities without difficulty  Other:  Hematoma to back of head  Medical Decision Making  Medically screening exam initiated at 6:24 PM.  Appropriate orders placed.  Corey Morrison was informed that the remainder of the evaluation will be completed by another provider, this initial triage assessment does not replace that evaluation, and the importance of remaining in the ED until their evaluation is complete.     Jackelyn Hoehn, PA-C 11/22/22 1828

## 2022-11-23 ENCOUNTER — Encounter: Payer: Self-pay | Admitting: Emergency Medicine

## 2022-11-23 ENCOUNTER — Emergency Department
Admission: EM | Admit: 2022-11-23 | Discharge: 2022-11-23 | Disposition: A | Discharge status: Skilled Nursing Facility | Payer: Medicare Other | Attending: Emergency Medicine | Admitting: Emergency Medicine

## 2022-11-23 DIAGNOSIS — S0003XA Contusion of scalp, initial encounter: Secondary | ICD-10-CM

## 2022-11-23 DIAGNOSIS — W19XXXA Unspecified fall, initial encounter: Secondary | ICD-10-CM

## 2022-11-23 DIAGNOSIS — S0990XA Unspecified injury of head, initial encounter: Secondary | ICD-10-CM

## 2022-11-23 NOTE — ED Notes (Signed)
Patietn has to go back by ems.  I gave  him a sandwich tray, as he has not eaten.

## 2022-11-23 NOTE — ED Notes (Signed)
See triage note.  He was unable to tell me what he was here for.  When I told  him he remembered falling twice.  He is alert and in no distress.

## 2022-11-23 NOTE — ED Notes (Signed)
Patient discharged to home per MD order. Patient in stable condition, and deemed medically cleared by ED provider for discharge. Discharge instructions reviewed with patient/family using "Teach Back"; verbalized understanding of medication education and administration, and information about follow-up care. Denies further concerns. ° °

## 2022-11-23 NOTE — ED Notes (Signed)
Attempted to contact [name] to transport patient without success. Will attempt family,

## 2022-11-23 NOTE — ED Notes (Signed)
Called [facility] for transport back to [facility] °

## 2022-11-23 NOTE — ED Provider Notes (Signed)
[facility] Provider Note    Event Date/Time   First MD Initiated Contact with Patient 11/23/22 [phone]     (approximate)   History   Fall   HPI  Corey Morrison is a 66 y.o. male history of [name]'s, falling, atrial fibrillation  He has a history of frequent falls.  He fell yesterday striking the back right of his head.  Did not lose consciousness.  He reports he follows regularly, recalls meeting me from a previous ER visit recently.  He has continued to feel like his [name]'s slowly worsens, and he sees Dr. Malvin Johns from neurology.  No numbness or weakness.  Or at least nothing new.  No neck pain.  Denies any other injury.  No precipitating factors.   [facility] at the [facility]  Physical Exam   Triage Vital Signs: ED Triage Vitals  Enc Vitals Group     BP 11/22/22 1824 132/79     Pulse Rate 11/22/22 1824 82     Resp 11/22/22 1824 18     Temp 11/22/22 1824 97.9 F (36.6 C)     Temp Source 11/22/22 1824 Oral     SpO2 11/22/22 1824 100 %     Weight --      Height --      Head Circumference --      Peak Flow --      Pain Score 11/22/22 1825 4     Pain Loc --      Pain Edu? --      Excl. in GC? --     Most recent vital signs: Vitals:   11/23/22 0627 11/23/22 0729  BP: 127/69 109/61  Pulse: 72 80  Resp: 16 16  Temp: 98.3 F (36.8 C) 97.9 F (36.6 C)  SpO2: 100% 95%     General: Awake, no distress.  Seated in wheelchair without distress.  [name]'s features of the face CV:  Good peripheral perfusion.  Resp:  Normal effort.  Clear bilateral Abd:  No distention.  Other:  Slow parkinsonian movements.  Slight tremor.  [name]'s-like face.  No focal deficits.  No midline cervical tenderness.  Normocephalic atraumatic except for a small area of scalp contusion or hematoma over the right posterior scalp.  No laceration or bleeding.  He is alert and well-oriented. ED Results / Procedures / Treatments   Labs (all labs ordered are listed, but  only abnormal results are displayed) Labs Reviewed  BASIC METABOLIC PANEL - Abnormal; Notable for the following components:      Result Value   Glucose, Bld 103 (*)    BUN 32 (*)    Calcium 8.8 (*)    All other components within normal limits  URINALYSIS, ROUTINE W REFLEX MICROSCOPIC - Abnormal; Notable for the following components:   Color, Urine AMBER (*)    APPearance CLEAR (*)    Ketones, ur 5 (*)    All other components within normal limits  CBC WITH DIFFERENTIAL/PLATELET - Abnormal; Notable for the following components:   RBC 3.38 (*)    Hemoglobin 10.1 (*)    HCT 31.7 (*)    All other components within normal limits  CBC  CBG MONITORING, ED  TROPONIN I (HIGH SENSITIVITY)  TROPONIN I (HIGH SENSITIVITY)   Labs reviewed.  Chronic anemia is noted.  Urinalysis without evidence of acute infection.  Chemistry panel shows mild hypocalcemia otherwise no acute abnormalities noted.  EKG interpreted by me, EKG time 1830 Left bundle branch block.  No  evidence of acute new ischemia  RADIOLOGY CT head interpreted by me as negative for acute gross intracranial pathology   Per radiology: IMPRESSION: CT HEAD:   1. No acute intracranial abnormality. 2. Soft tissue injury along the right parietal scalp. No evidence of underlying calvarial fracture.   CT CERVICAL SPINE:   No acute fracture or traumatic subluxation.      PROCEDURES:  Critical Care performed: No  Procedures   MEDICATIONS ORDERED IN ED: Medications - No data to display   IMPRESSION / MDM / [place] / ED COURSE  I reviewed the triage vital signs and the nursing notes.                              Differential diagnosis includes, but is not limited to, probable fall secondary to movement disorder or [name]'s, possible need for rule out of intracranial hemorrhage, subdural, cervical injury.  Patient reports no other injury.  Falls frequently.  No precipitating factors no obvious precipitating  illness or evidence to support of new or acute medical concern.  He is alert well-oriented without distress, small injury includes a slight hematoma over the back of the right scalp but otherwise no other injuries noted.  Patient's presentation is most consistent with acute complicated illness / injury requiring diagnostic workup.     Patient comfortable with plan to return to the [facility]. Return precautions and treatment recommendations and follow-up discussed with the patient who is agreeable with the plan.      FINAL CLINICAL IMPRESSION(S) / ED DIAGNOSES   Final diagnoses:  Contusion of scalp, initial encounter  Minor head injury, initial encounter  Fall, initial encounter     Rx / DC Orders   ED Discharge Orders     None        Note:  This document was prepared using Dragon voice recognition software and may include unintentional dictation errors.   Delman Kitten, MD 11/23/22 [phone]

## 2022-11-23 NOTE — Discharge Instructions (Addendum)
As we discussed, I would also recommend that you call Dr. Daisy Blossom office.  Would have you discussed with him considering if it might be good for you to be seen at the [name] as your [name]'s symptoms seem be be slowing progressing.

## 2022-12-11 ENCOUNTER — Emergency Department
Admission: EM | Admit: 2022-12-11 | Discharge: 2022-12-11 | Disposition: A | Discharge status: Home / Self Care | Payer: Medicare Other | Attending: Emergency Medicine | Admitting: Emergency Medicine

## 2022-12-11 ENCOUNTER — Emergency Department: Payer: Medicare Other

## 2022-12-11 DIAGNOSIS — S0990XA Unspecified injury of head, initial encounter: Secondary | ICD-10-CM

## 2022-12-11 DIAGNOSIS — Z7901 Long term (current) use of anticoagulants: Secondary | ICD-10-CM | POA: Diagnosis not present

## 2022-12-11 DIAGNOSIS — W19XXXA Unspecified fall, initial encounter: Secondary | ICD-10-CM | POA: Diagnosis not present

## 2022-12-11 DIAGNOSIS — R4182 Altered mental status, unspecified: Secondary | ICD-10-CM | POA: Diagnosis present

## 2022-12-11 DIAGNOSIS — S0181XA Laceration without foreign body of other part of head, initial encounter: Secondary | ICD-10-CM | POA: Insufficient documentation

## 2022-12-11 DIAGNOSIS — R404 Transient alteration of awareness: Secondary | ICD-10-CM | POA: Insufficient documentation

## 2022-12-11 DIAGNOSIS — I4891 Unspecified atrial fibrillation: Secondary | ICD-10-CM | POA: Diagnosis not present

## 2022-12-11 LAB — BASIC METABOLIC PANEL
Anion gap: 5 (ref 5–15)
BUN: 34 mg/dL — ABNORMAL HIGH (ref 8–23)
CO2: 26 mmol/L (ref 22–32)
Calcium: 9.1 mg/dL (ref 8.9–10.3)
Chloride: 109 mmol/L (ref 98–111)
Creatinine, Ser: 1.13 mg/dL (ref 0.61–1.24)
GFR, Estimated: >60 mL/min (ref >60)
Glucose, Bld: 93 mg/dL (ref 70–99)
Potassium: 4.7 mmol/L (ref 3.5–5.1)
Sodium: 140 mmol/L (ref 135–145)

## 2022-12-11 LAB — CBC WITH DIFFERENTIAL/PLATELET
Abs Immature Granulocytes: 0.03 10*3/uL (ref 0.00–0.07)
Basophils Absolute: 0 10*3/uL (ref 0.0–0.1)
Basophils Relative: 0 %
Eosinophils Absolute: 0.1 10*3/uL (ref 0.0–0.5)
Eosinophils Relative: 1 %
HCT: 33.7 % — ABNORMAL LOW (ref 39.0–52.0)
Hemoglobin: 10.8 g/dL — ABNORMAL LOW (ref 13.0–17.0)
Immature Granulocytes: 0 %
Lymphocytes Relative: 12 %
Lymphs Abs: 0.9 10*3/uL (ref 0.7–4.0)
MCH: 30.3 pg (ref 26.0–34.0)
MCHC: 32 g/dL (ref 30.0–36.0)
MCV: 94.4 fL (ref 80.0–100.0)
Monocytes Absolute: 0.5 10*3/uL (ref 0.1–1.0)
Monocytes Relative: 6 %
Neutro Abs: 6 10*3/uL (ref 1.7–7.7)
Neutrophils Relative %: 81 %
Platelets: 189 10*3/uL (ref 150–400)
RBC: 3.57 MIL/uL — ABNORMAL LOW (ref 4.22–5.81)
RDW: 14.6 % (ref 11.5–15.5)
WBC: 7.5 10*3/uL (ref 4.0–10.5)
nRBC: 0 % (ref 0.0–0.2)

## 2022-12-11 LAB — URINALYSIS, ROUTINE W REFLEX MICROSCOPIC
Bilirubin Urine: NEGATIVE
Glucose, UA: NEGATIVE mg/dL
Hgb urine dipstick: NEGATIVE
Ketones, ur: NEGATIVE mg/dL
Leukocytes,Ua: NEGATIVE
Nitrite: NEGATIVE
Protein, ur: NEGATIVE mg/dL
Specific Gravity, Urine: 1.016 (ref 1.005–1.030)
pH: 6 (ref 5.0–8.0)

## 2022-12-11 LAB — TROPONIN I (HIGH SENSITIVITY)
Troponin I (High Sensitivity): 9 ng/L (ref <18)
Troponin I (High Sensitivity): 7 ng/L (ref <18)

## 2022-12-11 NOTE — ED Provider Notes (Addendum)
[facility] Provider Note    Event Date/Time   First MD Initiated Contact with Patient 12/11/22 [phone]     (approximate)   History   Fall and Altered Mental Status   HPI  Corey Morrison is a 67 y.o. male with a history of [name] disease and atrial fibrillation not on anticoagulation who presents after a fall and with possible altered mental status.  EMS reports that the patient fell around 6 AM and had a small laceration to his left forehead.  He decided he did not want to come to the hospital for this.  Subsequently however staff at his facility noticed that he was altered.  At that time he was not speaking and appeared very confused.  His mental status has now returned closer to baseline.  The patient did have urinary incontinence during this episode.  EMS reports a possible seizure history although I do not see this documented in the patient's paperwork from the facility or in his prior records here.  I reviewed the past medical records.  The patient was most recently seen in the ED on 12/27 for a fall, and for generalized weakness on 12/10.  He was seen by Dr. Melrose Nakayama from neurology on 1/11 and is being treated for [name]'s with dyskinesia.  He is on Sinemet and Risperdal.   Physical Exam   Triage Vital Signs: ED Triage Vitals [12/11/22 0738]  Enc Vitals Group     BP      Pulse      Resp      Temp      Temp src      SpO2      Weight      Height      Head Circumference      Peak Flow      Pain Score 0     Pain Loc      Pain Edu?      Excl. in [place]?     Most recent vital signs: Vitals:   12/11/22 1117 12/11/22 1200  BP: (!) 146/77 131/74  Pulse:  84  Resp:  14  Temp:  98.2 F (36.8 C)  SpO2:  100%     General: Alert, oriented x 2, comfortable appearing. CV:  Good peripheral perfusion.  Resp:  Normal effort.  Abd:  No distention.  Other:  EOMI.  PERRLA.  No facial droop.  Normal speech.  Motor and sensory intact in all extremities.   No pronator drift.  Mild bilateral tremor but no ataxia.   ED Results / Procedures / Treatments   Labs (all labs ordered are listed, but only abnormal results are displayed) Labs Reviewed  BASIC METABOLIC PANEL - Abnormal; Notable for the following components:      Result Value   BUN 34 (*)    All other components within normal limits  CBC WITH DIFFERENTIAL/PLATELET - Abnormal; Notable for the following components:   RBC 3.57 (*)    Hemoglobin 10.8 (*)    HCT 33.7 (*)    All other components within normal limits  URINALYSIS, ROUTINE W REFLEX MICROSCOPIC - Abnormal; Notable for the following components:   Color, Urine YELLOW (*)    APPearance CLEAR (*)    All other components within normal limits  TROPONIN I (HIGH SENSITIVITY)  TROPONIN I (HIGH SENSITIVITY)     EKG  ED ECG REPORT I, Arta Silence, the attending physician, personally viewed and interpreted this ECG.  Date: 12/11/2022 EKG Time: 1202  Rate: 82 Rhythm: Atrial fibrillation QRS Axis: normal Intervals: LBBB ST/T Wave abnormalities: normal Narrative Interpretation: no evidence of acute ischemia; no significant change when compared to EKG of 11/22/2022    RADIOLOGY  CT head: I independently viewed and interpreted the images; there is no ICH and radiology report indicates no acute abnormality.  CT cervical spine: No acute fracture   PROCEDURES:  Critical Care performed: No  ..Laceration Repair  Date/Time: 12/11/2022 12:02 PM  Performed by: Arta Silence, MD Authorized by: Arta Silence, MD   Consent:    Consent obtained:  Verbal   Consent given by:  Healthcare agent Universal protocol:    Patient identity confirmed:  Arm band Anesthesia:    Anesthesia method:  None Laceration details:    Location:  Face   Face location:  Forehead   Length (cm):  2 Skin repair:    Repair method:  Tissue adhesive Approximation:    Approximation:  Close Repair type:    Repair type:   Simple Post-procedure details:    Procedure completion:  Tolerated well, no immediate complications    MEDICATIONS ORDERED IN ED: Medications - No data to display   IMPRESSION / MDM / [place] / ED COURSE  I reviewed the triage vital signs and the nursing notes.  67 year old male with PMH as noted above presents after an unwitnessed fall, subsequently with an episode of altered mental status although he appears back to baseline at this time.  Neurologic exam is nonfocal.  Differential diagnosis includes, but is not limited to, mechanical fall, syncope, seizure.  It is possible that the patient could have had a seizure and then was subsequently postictal and now has returned to baseline, however he has no documented seizure history.  Differential also includes UTI, electrolyte abnormality, dehydration, or less likely cardiac cause.  We will obtain CT head and cervical spine, lab workup, and reassess.  Patient's presentation is most consistent with acute presentation with potential threat to life or bodily function.  The patient is on the cardiac monitor to evaluate for evidence of arrhythmia and/or significant heart rate changes.  ----------------------------------------- 12:01 PM on 12/11/2022 -----------------------------------------  CT head and cervical spine are negative.  Lab workup is unremarkable.  Troponins are negative x 2.  Urinalysis is normal.  There is no leukocytosis.  Electrolytes are normal.  The patient has not had any seizure episodes or changes in mental status.  The patient's power of attorney is here.  He states that the patient appears mildly confused compared to baseline, but that this is normal for him before his [name]'s medications have kicked in.  He reports the patient frequently has low blood pressure which will often cause him to pass out and sometimes be confused.  At this time, there is no evidence of an acute process.  The patient is stable  for discharge home, and the POA agrees with him going back to his facility.  I counseled him on the results of the workup.  Return precautions provided, and the POA expressed understanding.  ----------------------------------------- 12:27 PM on 12/11/2022 -----------------------------------------  After being moved from the bed to the wheelchair for discharge, the patient had an episode of disorientation where he stopped speaking, was looking around, and making grasping movements.  However, these were purposeful movements grabbing the chair in his face, he did not have any convulsions or seizure-like activity.  After a few minutes in the chair this resolved and the patient began speaking again and went back to  his baseline.  The POA states that this has happened many times previously although is not a daily occurrence.  He states that this often happens when the patient moves from lying down to sitting or standing.  We checked the blood pressure and it was normal.  The patient has already had a seizure workup and has no evidence of seizures.  I recommended observing the patient for another 10 to 15 minutes at least.  After a few more minutes the patient returned to his baseline and the POA stated he would like to proceed with discharge.  He again confirms that these episodes have happened multiple times before.  I suspect that this is what happened earlier that led to the patient being taken to the ED.  I reiterated return precautions and the POA expressed understanding.   FINAL CLINICAL IMPRESSION(S) / ED DIAGNOSES   Final diagnoses:  Minor head injury, initial encounter  Laceration of forehead, initial encounter  Transient alteration of awareness     Rx / DC Orders   ED Discharge Orders     None        Note:  This document was prepared using Dragon voice recognition software and may include unintentional dictation errors.    Dionne Bucy, MD 12/11/22 1204     Dionne Bucy, MD 12/11/22 1228

## 2022-12-11 NOTE — ED Triage Notes (Signed)
Pt presents to the ED via [facility] from the [facility] due to AMS. Pt had a fall earlier today at the facility. Per facility patient became confused from baseline. EMS states pt mentioned he had a seizure but unaware of when, pt does not have a hx of seizure. Pt is A&Ox2

## 2022-12-11 NOTE — Discharge Instructions (Signed)
Mr. Garrick had a CT scan of his head and cervical spine which were negative.  Labs do not show any concerning findings.  He has not had any recurrent episodes of confusion or any seizures while in the ED.  He may follow-up with his regular doctor.  The laceration was repaired with glue.

## 2022-12-11 NOTE — ED Notes (Signed)
Attempted to call [facility] but no one answered. Family at bedside

## 2022-12-11 NOTE — ED Notes (Signed)
Attempted to call facility no answer. Family at bedside to take patient home.

## 2022-12-11 NOTE — ED Notes (Signed)
75/61 was not a accurate pressure redone at 1117

## 2022-12-16 ENCOUNTER — Ambulatory Visit: Payer: Medicare Other | Admitting: Cardiology

## 2023-02-01 ENCOUNTER — Emergency Department: Payer: Medicare Other

## 2023-02-01 ENCOUNTER — Other Ambulatory Visit: Payer: Self-pay

## 2023-02-01 ENCOUNTER — Emergency Department
Admission: EM | Admit: 2023-02-01 | Discharge: 2023-02-01 | Disposition: A | Discharge status: Home / Self Care | Payer: Medicare Other | Attending: Emergency Medicine | Admitting: Emergency Medicine

## 2023-02-01 ENCOUNTER — Encounter: Payer: Self-pay | Admitting: Emergency Medicine

## 2023-02-01 DIAGNOSIS — R42 Dizziness and giddiness: Secondary | ICD-10-CM | POA: Insufficient documentation

## 2023-02-01 DIAGNOSIS — Z23 Encounter for immunization: Secondary | ICD-10-CM | POA: Diagnosis not present

## 2023-02-01 DIAGNOSIS — S51812A Laceration without foreign body of left forearm, initial encounter: Secondary | ICD-10-CM | POA: Insufficient documentation

## 2023-02-01 DIAGNOSIS — Z1152 Encounter for screening for COVID-19: Secondary | ICD-10-CM | POA: Diagnosis not present

## 2023-02-01 DIAGNOSIS — W182XXA Fall in (into) shower or empty bathtub, initial encounter: Secondary | ICD-10-CM | POA: Diagnosis not present

## 2023-02-01 DIAGNOSIS — W19XXXA Unspecified fall, initial encounter: Secondary | ICD-10-CM

## 2023-02-01 DIAGNOSIS — S59912A Unspecified injury of left forearm, initial encounter: Secondary | ICD-10-CM | POA: Diagnosis present

## 2023-02-01 DIAGNOSIS — G20C Parkinsonism, unspecified: Secondary | ICD-10-CM | POA: Insufficient documentation

## 2023-02-01 DIAGNOSIS — Y92002 Bathroom of unspecified non-institutional (private) residence single-family (private) house as the place of occurrence of the external cause: Secondary | ICD-10-CM | POA: Diagnosis not present

## 2023-02-01 LAB — RESP PANEL BY RT-PCR (RSV, FLU A&B, COVID)  RVPGX2
Influenza A by PCR: NEGATIVE
Influenza B by PCR: NEGATIVE
Resp Syncytial Virus by PCR: NEGATIVE
SARS Coronavirus 2 by RT PCR: NEGATIVE

## 2023-02-01 LAB — URINALYSIS, W/ REFLEX TO CULTURE (INFECTION SUSPECTED)
Bacteria, UA: NONE SEEN
Bilirubin Urine: NEGATIVE
Glucose, UA: NEGATIVE mg/dL
Hgb urine dipstick: NEGATIVE
Ketones, ur: 20 mg/dL — AB
Leukocytes,Ua: NEGATIVE
Nitrite: NEGATIVE
Protein, ur: NEGATIVE mg/dL
Specific Gravity, Urine: 1.019 (ref 1.005–1.030)
Squamous Epithelial / HPF: NONE SEEN /HPF (ref 0–5)
WBC, UA: NONE SEEN WBC/hpf (ref 0–5)
pH: 6 (ref 5.0–8.0)

## 2023-02-01 LAB — CBC WITH DIFFERENTIAL/PLATELET
Abs Immature Granulocytes: 0.03 10*3/uL (ref 0.00–0.07)
Basophils Absolute: 0 10*3/uL (ref 0.0–0.1)
Basophils Relative: 0 %
Eosinophils Absolute: 0.1 10*3/uL (ref 0.0–0.5)
Eosinophils Relative: 1 %
HCT: 31.9 % — ABNORMAL LOW (ref 39.0–52.0)
Hemoglobin: 10.3 g/dL — ABNORMAL LOW (ref 13.0–17.0)
Immature Granulocytes: 0 %
Lymphocytes Relative: 10 %
Lymphs Abs: 0.7 10*3/uL (ref 0.7–4.0)
MCH: 31 pg (ref 26.0–34.0)
MCHC: 32.3 g/dL (ref 30.0–36.0)
MCV: 96.1 fL (ref 80.0–100.0)
Monocytes Absolute: 0.4 10*3/uL (ref 0.1–1.0)
Monocytes Relative: 6 %
Neutro Abs: 6 10*3/uL (ref 1.7–7.7)
Neutrophils Relative %: 83 %
Platelets: 151 10*3/uL (ref 150–400)
RBC: 3.32 MIL/uL — ABNORMAL LOW (ref 4.22–5.81)
RDW: 13 % (ref 11.5–15.5)
WBC: 7.2 10*3/uL (ref 4.0–10.5)
nRBC: 0 % (ref 0.0–0.2)

## 2023-02-01 LAB — BASIC METABOLIC PANEL
Anion gap: 8 (ref 5–15)
BUN: 31 mg/dL — ABNORMAL HIGH (ref 8–23)
CO2: 25 mmol/L (ref 22–32)
Calcium: 8.9 mg/dL (ref 8.9–10.3)
Chloride: 108 mmol/L (ref 98–111)
Creatinine, Ser: 1 mg/dL (ref 0.61–1.24)
GFR, Estimated: >60 mL/min (ref >60)
Glucose, Bld: 104 mg/dL — ABNORMAL HIGH (ref 70–99)
Potassium: 4.7 mmol/L (ref 3.5–5.1)
Sodium: 141 mmol/L (ref 135–145)

## 2023-02-01 LAB — TROPONIN I (HIGH SENSITIVITY)
Troponin I (High Sensitivity): 8 ng/L (ref <18)
Troponin I (High Sensitivity): 7 ng/L (ref <18)

## 2023-02-01 MED ORDER — SODIUM CHLORIDE 0.9 % IV BOLUS
1000.0000 mL | Freq: Once | INTRAVENOUS | Status: AC
  Administered 2023-02-01: 1000 mL via INTRAVENOUS

## 2023-02-01 MED ORDER — TETANUS-DIPHTH-ACELL PERTUSSIS 5-2.5-18.5 LF-MCG/0.5 IM SUSY
0.5000 mL | PREFILLED_SYRINGE | Freq: Once | INTRAMUSCULAR | Status: AC
  Administered 2023-02-01: 0.5 mL via INTRAMUSCULAR
  Filled 2023-02-01: qty 0.5

## 2023-02-01 NOTE — Discharge Instructions (Signed)
Your blood work and CAT scans and urine are normal.  You may go back to your assisted living facility.  Please return for any new, worsening, or change in symptoms or other concerns caring for you today.

## 2023-02-01 NOTE — ED Notes (Signed)
The pt was placed on a male purwick. A chuck was placed under the pt.

## 2023-02-01 NOTE — ED Triage Notes (Signed)
Patient to ED via [facility] from the [facility] for an unwitnessed fall. Patient unsure of how fall occurred. Only known injury is a lac to left forearm. Not on blood thinners. Hx of [name]'s. Denies pain at this time. Per facility, patient has had an increase in falls.

## 2023-02-01 NOTE — ED Notes (Signed)
The pt was assisted with standing and ambulating with a walker. The pt was able to stand and ambulate with the assistance of staff and the walker. While trying to turn at the door way the pt attempted to pull backwards and try to fall. The pt was assisted to a chair and wheeled back to his room. The pt was able to stand and pivot with a two person assist to the bed. The pt was placed in the bed without incident.The pt's bed alarm was turned on.

## 2023-02-01 NOTE — ED Provider Notes (Signed)
[facility] Provider Note    Event Date/Time   First MD Initiated Contact with Patient 02/01/23 [phone]     (approximate)   History   Fall   HPI  Corey Morrison is a 67 y.o. male with a history of [name] disease and atrial fibrillation not on anticoagulation who presents today for evaluation after a fall.  Patient reports that he fell this morning, though he is unsure of why.  He reports that his friend came over last night and he felt fine at that time.  He reports that this morning he got up to use the bathroom and felt lightheaded and is unsure if he passed out, but reports that he fell.  He reports that his neighbor heard him and came to his assistance.  He reports that he currently has a cut on his left arm but otherwise feels pretty well.  He has not had any nausea or vomiting.  He denies headache or neck pain.  He denies chest pain, palpitations, or abdominal pain.   Patient Active Problem List   Diagnosis Date Noted   Hallucination 07/27/2022   Peripheral neuropathy 07/27/2022   [name] disease, symptomatic 07/23/2022   GERD without esophagitis 07/21/2022   Dyslipidemia 07/21/2022   Vitamin B12 deficiency 07/21/2022   Depression 07/21/2022   Weakness    Nocturnal cough 06/25/2022   Insomnia 06/25/2022   Orthostatic hypotension 06/23/2022   Paroxysmal atrial fibrillation (HCC) 06/23/2022   Normocytic anemia 06/23/2022   Ambulatory dysfunction 06/22/2022   [name]'s disease 06/11/2022          Physical Exam   Triage Vital Signs: ED Triage Vitals  Enc Vitals Group     BP 02/01/23 0757 (!) 134/103     Pulse Rate 02/01/23 0757 83     Resp 02/01/23 0757 18     Temp 02/01/23 0757 97.9 F (36.6 C)     Temp Source 02/01/23 0757 Oral     SpO2 02/01/23 0757 100 %     Weight --      Height --      Head Circumference --      Peak Flow --      Pain Score 02/01/23 0755 0     Pain Loc --      Pain Edu? --      Excl. in [place]? --     Most  recent vital signs: Vitals:   02/01/23 0757 02/01/23 1213  BP: (!) 134/103 (!) 152/80  Pulse: 83 78  Resp: 18 18  Temp: 97.9 F (36.6 C)   SpO2: 100% 100%    Physical Exam Vitals and nursing note reviewed.  Constitutional:      General: Awake and alert. No acute distress.    Appearance: Normal appearance. The patient is normal weight.  HENT:     Head: Normocephalic and atraumatic.     Mouth: Mucous membranes are moist.  Eyes:     General: PERRL. Normal EOMs        Right eye: No discharge.        Left eye: No discharge.     Conjunctiva/sclera: Conjunctivae normal.  Cardiovascular:     Rate and Rhythm: Normal rate and regular rhythm.     Pulses: Normal pulses.  Pulmonary:     Effort: Pulmonary effort is normal. No respiratory distress.     Breath sounds: Normal breath sounds.  Abdominal:     Abdomen is soft. There is no abdominal tenderness. No rebound  or guarding. No distention. Musculoskeletal:        General: No swelling. Normal range of motion.     Cervical back: Normal range of motion and neck supple.  Skin:    General: Skin is warm and dry.     Capillary Refill: Capillary refill takes less than 2 seconds.     Findings: Superficial laceration noted to left forearm.  No active bleeding.  No bony tenderness.  Full range of motion with active and passive range of motion of wrist, elbow and shoulder. Neurological:     Mental Status: The patient is awake and alert.   Neurological: Awake and alert, answers questions appropriately, follows commands Normal speech, no expressive or receptive aphasia or dysarthria Cranial nerves II through XII intact Normal visual fields 5 out of 5 strength in all 4 extremities with intact sensation throughout No extremity drift Normal finger-to-nose testing, no limb or truncal ataxia   ED Results / Procedures / Treatments   Labs (all labs ordered are listed, but only abnormal results are displayed) Labs Reviewed  URINALYSIS, W/  REFLEX TO CULTURE (INFECTION SUSPECTED) - Abnormal; Notable for the following components:      Result Value   Color, Urine AMBER (*)    APPearance CLEAR (*)    Ketones, ur 20 (*)    All other components within normal limits  BASIC METABOLIC PANEL - Abnormal; Notable for the following components:   Glucose, Bld 104 (*)    BUN 31 (*)    All other components within normal limits  CBC WITH DIFFERENTIAL/PLATELET - Abnormal; Notable for the following components:   RBC 3.32 (*)    Hemoglobin 10.3 (*)    HCT 31.9 (*)    All other components within normal limits  RESP PANEL BY RT-PCR (RSV, FLU A&B, COVID)  [id number]  TROPONIN I (HIGH SENSITIVITY)  TROPONIN I (HIGH SENSITIVITY)     EKG     RADIOLOGY I independently reviewed and interpreted imaging and agree with radiologists findings.     PROCEDURES:  Critical Care performed:   Procedures   MEDICATIONS ORDERED IN ED: Medications  Tdap (BOOSTRIX) injection 0.5 mL (0.5 mLs Intramuscular Given 02/01/23 0902)  sodium chloride 0.9 % bolus 1,000 mL (1,000 mLs Intravenous New Bag/Given 02/01/23 1113)     IMPRESSION / MDM / ASSESSMENT AND PLAN / ED COURSE  I reviewed the triage vital signs and the nursing notes.   Differential diagnosis includes, but is not limited to, contusion, abrasion, intracranial hemorrhage, metabolic encephalopathy, urinary tract infection, dehydration.    Patient is awake and alert, hemodynamically stable and afebrile.  He is able to recount the events that brought him here.  I spoke with the nurse caring for him at the [facility] who reports that he was at his mental baseline today.  She reports that he has waxing and waning days ever since he first came to the [facility] which has been 6 months.  She reports that he occasionally has hallucinations which is not unusual for him, but reports that he was acting normally this morning.  She reports that his neighbor heard him fall, and when they came into his room he was leaning  up against the refrigerator but was awake and alert.  H&H is at his baseline.  BUN to creatinine ratio suggestive of prerenal volume depletion.  He was hydrated with 1 L of normal saline.  His wound was cleaned and bandaged.Marland Kitchen  He was able to ambulate with an assist device.  No medical reason for admission besides requiring placement, however patient is already placed in an assisted living facility.  He is at his mental baseline after discussion with RN at his assisted living facility.  Case was discussed with Dr. Starleen Blue who agrees with assessment and plan  Patient's presentation is most consistent with acute complicated illness / injury requiring diagnostic workup.   Clinical Course as of 02/01/23 1444  Wed Feb 01, 2023  0825 Spoke with RN, fell against the wall, hit his head, sweaty and clammy. No incontinence. At baseline, sometimes oriented, other times hallucinations, ever since 6 months.  [JP]    Clinical Course User Index [JP] Adonica Fukushima, Clarnce Flock, PA-C     FINAL CLINICAL IMPRESSION(S) / ED DIAGNOSES   Final diagnoses:  Fall, initial encounter  Skin tear of left forearm without complication, initial encounter     Rx / DC Orders   ED Discharge Orders     None        Note:  This document was prepared using Dragon voice recognition software and may include unintentional dictation errors.   Emeline Gins 02/01/23 1444    Rada Hay, MD 02/01/23 1501

## 2023-02-01 NOTE — ED Notes (Signed)
[facility] was contacted via phone. Rick the pt's POA is taking the pt back to the facility.

## 2023-02-01 NOTE — ED Notes (Signed)
[facility]  CALLED  FOR  TRANSPORT  TO  [facility]  [place] 

## 2023-02-03 ENCOUNTER — Emergency Department: Payer: Medicare Other

## 2023-02-03 ENCOUNTER — Encounter: Payer: Self-pay | Admitting: Internal Medicine

## 2023-02-03 ENCOUNTER — Other Ambulatory Visit: Payer: Self-pay

## 2023-02-03 ENCOUNTER — Inpatient Hospital Stay
Admission: EM | Admit: 2023-02-03 | Discharge: 2023-02-07 | DRG: 057 | Disposition: A | Discharge status: Skilled Nursing Facility | Payer: Medicare Other | Source: Skilled Nursing Facility | Attending: Internal Medicine | Admitting: Internal Medicine

## 2023-02-03 DIAGNOSIS — R262 Difficulty in walking, not elsewhere classified: Secondary | ICD-10-CM | POA: Diagnosis present

## 2023-02-03 DIAGNOSIS — I480 Paroxysmal atrial fibrillation: Secondary | ICD-10-CM | POA: Diagnosis present

## 2023-02-03 DIAGNOSIS — Z7982 Long term (current) use of aspirin: Secondary | ICD-10-CM

## 2023-02-03 DIAGNOSIS — R296 Repeated falls: Secondary | ICD-10-CM | POA: Diagnosis present

## 2023-02-03 DIAGNOSIS — Z751 Person awaiting admission to adequate facility elsewhere: Secondary | ICD-10-CM

## 2023-02-03 DIAGNOSIS — I2510 Atherosclerotic heart disease of native coronary artery without angina pectoris: Secondary | ICD-10-CM | POA: Diagnosis present

## 2023-02-03 DIAGNOSIS — Z87891 Personal history of nicotine dependence: Secondary | ICD-10-CM

## 2023-02-03 DIAGNOSIS — S0990XA Unspecified injury of head, initial encounter: Secondary | ICD-10-CM

## 2023-02-03 DIAGNOSIS — G20B1 Parkinson's disease with dyskinesia, without mention of fluctuations: Principal | ICD-10-CM | POA: Diagnosis present

## 2023-02-03 DIAGNOSIS — W19XXXA Unspecified fall, initial encounter: Secondary | ICD-10-CM | POA: Diagnosis present

## 2023-02-03 DIAGNOSIS — K219 Gastro-esophageal reflux disease without esophagitis: Secondary | ICD-10-CM | POA: Diagnosis present

## 2023-02-03 DIAGNOSIS — Z955 Presence of coronary angioplasty implant and graft: Secondary | ICD-10-CM

## 2023-02-03 DIAGNOSIS — F0283 Dementia in other diseases classified elsewhere, unspecified severity, with mood disturbance: Secondary | ICD-10-CM | POA: Diagnosis present

## 2023-02-03 DIAGNOSIS — E785 Hyperlipidemia, unspecified: Secondary | ICD-10-CM | POA: Diagnosis present

## 2023-02-03 DIAGNOSIS — Z79899 Other long term (current) drug therapy: Secondary | ICD-10-CM

## 2023-02-03 DIAGNOSIS — R9431 Abnormal electrocardiogram [ECG] [EKG]: Secondary | ICD-10-CM | POA: Diagnosis present

## 2023-02-03 DIAGNOSIS — G9341 Metabolic encephalopathy: Secondary | ICD-10-CM | POA: Diagnosis present

## 2023-02-03 DIAGNOSIS — I959 Hypotension, unspecified: Secondary | ICD-10-CM | POA: Diagnosis not present

## 2023-02-03 DIAGNOSIS — R4182 Altered mental status, unspecified: Secondary | ICD-10-CM

## 2023-02-03 DIAGNOSIS — G629 Polyneuropathy, unspecified: Secondary | ICD-10-CM | POA: Diagnosis present

## 2023-02-03 DIAGNOSIS — Z66 Do not resuscitate: Secondary | ICD-10-CM | POA: Diagnosis present

## 2023-02-03 DIAGNOSIS — G20B2 Parkinson's disease with dyskinesia, with fluctuations: Secondary | ICD-10-CM | POA: Diagnosis not present

## 2023-02-03 DIAGNOSIS — D649 Anemia, unspecified: Secondary | ICD-10-CM | POA: Diagnosis present

## 2023-02-03 DIAGNOSIS — I951 Orthostatic hypotension: Secondary | ICD-10-CM | POA: Diagnosis present

## 2023-02-03 DIAGNOSIS — G20A1 Parkinson's disease without dyskinesia, without mention of fluctuations: Secondary | ICD-10-CM | POA: Diagnosis present

## 2023-02-03 DIAGNOSIS — F0284 Dementia in other diseases classified elsewhere, unspecified severity, with anxiety: Secondary | ICD-10-CM | POA: Diagnosis present

## 2023-02-03 DIAGNOSIS — F418 Other specified anxiety disorders: Secondary | ICD-10-CM | POA: Diagnosis present

## 2023-02-03 DIAGNOSIS — G9389 Other specified disorders of brain: Secondary | ICD-10-CM | POA: Diagnosis present

## 2023-02-03 DIAGNOSIS — G909 Disorder of the autonomic nervous system, unspecified: Secondary | ICD-10-CM | POA: Diagnosis present

## 2023-02-03 DIAGNOSIS — R54 Age-related physical debility: Secondary | ICD-10-CM | POA: Diagnosis present

## 2023-02-03 DIAGNOSIS — R030 Elevated blood-pressure reading, without diagnosis of hypertension: Secondary | ICD-10-CM | POA: Diagnosis present

## 2023-02-03 DIAGNOSIS — D72829 Elevated white blood cell count, unspecified: Secondary | ICD-10-CM | POA: Diagnosis present

## 2023-02-03 DIAGNOSIS — F0282 Dementia in other diseases classified elsewhere, unspecified severity, with psychotic disturbance: Secondary | ICD-10-CM | POA: Diagnosis present

## 2023-02-03 LAB — CBC
HCT: 31.6 % — ABNORMAL LOW (ref 39.0–52.0)
Hemoglobin: 10.1 g/dL — ABNORMAL LOW (ref 13.0–17.0)
MCH: 30.7 pg (ref 26.0–34.0)
MCHC: 32 g/dL (ref 30.0–36.0)
MCV: 96 fL (ref 80.0–100.0)
Platelets: 149 10*3/uL — ABNORMAL LOW (ref 150–400)
RBC: 3.29 MIL/uL — ABNORMAL LOW (ref 4.22–5.81)
RDW: 13.2 % (ref 11.5–15.5)
WBC: 12.3 10*3/uL — ABNORMAL HIGH (ref 4.0–10.5)
nRBC: 0 % (ref 0.0–0.2)

## 2023-02-03 LAB — COMPREHENSIVE METABOLIC PANEL
ALT: <5 U/L (ref 0–44)
AST: 14 U/L — ABNORMAL LOW (ref 15–41)
Albumin: 3.8 g/dL (ref 3.5–5.0)
Alkaline Phosphatase: 71 U/L (ref 38–126)
Anion gap: 8 (ref 5–15)
BUN: 36 mg/dL — ABNORMAL HIGH (ref 8–23)
CO2: 25 mmol/L (ref 22–32)
Calcium: 8.8 mg/dL — ABNORMAL LOW (ref 8.9–10.3)
Chloride: 105 mmol/L (ref 98–111)
Creatinine, Ser: 1.1 mg/dL (ref 0.61–1.24)
GFR, Estimated: >60 mL/min (ref >60)
Glucose, Bld: 102 mg/dL — ABNORMAL HIGH (ref 70–99)
Potassium: 4.3 mmol/L (ref 3.5–5.1)
Sodium: 138 mmol/L (ref 135–145)
Total Bilirubin: 0.9 mg/dL (ref 0.3–1.2)
Total Protein: 6.8 g/dL (ref 6.5–8.1)

## 2023-02-03 LAB — URINALYSIS, W/ REFLEX TO CULTURE (INFECTION SUSPECTED)
Bacteria, UA: NONE SEEN
Bilirubin Urine: NEGATIVE
Glucose, UA: NEGATIVE mg/dL
Hgb urine dipstick: NEGATIVE
Ketones, ur: 5 mg/dL — AB
Leukocytes,Ua: NEGATIVE
Nitrite: NEGATIVE
Protein, ur: NEGATIVE mg/dL
Specific Gravity, Urine: 1.019 (ref 1.005–1.030)
Squamous Epithelial / HPF: NONE SEEN /HPF (ref 0–5)
pH: 6 (ref 5.0–8.0)

## 2023-02-03 LAB — TROPONIN I (HIGH SENSITIVITY): Troponin I (High Sensitivity): 7 ng/L (ref <18)

## 2023-02-03 LAB — CBG MONITORING, ED: Glucose-Capillary: 96 mg/dL (ref 70–99)

## 2023-02-03 LAB — LACTIC ACID, PLASMA: Lactic Acid, Venous: 1.6 mmol/L (ref 0.5–1.9)

## 2023-02-03 MED ORDER — POLYETHYLENE GLYCOL 3350 17 G PO PACK: 17.0000 g | PACK | Freq: Every day | ORAL | Status: DC | PRN

## 2023-02-03 MED ORDER — MIDODRINE HCL 5 MG PO TABS
5.0000 mg | ORAL_TABLET | Freq: Three times a day (TID) | ORAL | Status: DC
  Administered 2023-02-04 – 2023-02-07 (×9): 5 mg via ORAL
  Filled 2023-02-03 (×9): qty 1

## 2023-02-03 MED ORDER — GUAIFENESIN 100 MG/5ML PO LIQD: 300.0000 mg | Freq: Four times a day (QID) | ORAL | Status: DC | PRN

## 2023-02-03 MED ORDER — PSYLLIUM 95 % PO PACK
1.0000 | PACK | Freq: Every day | ORAL | Status: DC
  Administered 2023-02-03 – 2023-02-06 (×4): 1 via ORAL
  Filled 2023-02-03 (×4): qty 1

## 2023-02-03 MED ORDER — CARBIDOPA-LEVODOPA 25-100 MG PO TABS
1.0000 | ORAL_TABLET | Freq: Three times a day (TID) | ORAL | Status: DC
  Administered 2023-02-03 – 2023-02-07 (×10): 1 via ORAL
  Filled 2023-02-03 (×10): qty 1

## 2023-02-03 MED ORDER — MAGNESIUM HYDROXIDE 400 MG/5ML PO SUSP: 30.0000 mL | Freq: Every day | ORAL | Status: DC | PRN

## 2023-02-03 MED ORDER — PANTOPRAZOLE SODIUM 40 MG PO TBEC
40.0000 mg | DELAYED_RELEASE_TABLET | Freq: Every day | ORAL | Status: DC
  Administered 2023-02-04 – 2023-02-07 (×4): 40 mg via ORAL
  Filled 2023-02-03 (×4): qty 1

## 2023-02-03 MED ORDER — VITAMIN D 25 MCG (1000 UNIT) PO TABS
2000.0000 [IU] | ORAL_TABLET | Freq: Every day | ORAL | Status: DC
  Administered 2023-02-04 – 2023-02-07 (×4): 2000 [IU] via ORAL
  Filled 2023-02-03 (×4): qty 2

## 2023-02-03 MED ORDER — MIDODRINE HCL 5 MG PO TABS: 10.0000 mg | ORAL_TABLET | Freq: Three times a day (TID) | ORAL | Status: DC

## 2023-02-03 MED ORDER — GABAPENTIN 100 MG PO CAPS
200.0000 mg | ORAL_CAPSULE | Freq: Three times a day (TID) | ORAL | Status: DC
  Administered 2023-02-03 – 2023-02-07 (×11): 200 mg via ORAL
  Filled 2023-02-03 (×11): qty 2

## 2023-02-03 MED ORDER — FLUTICASONE PROPIONATE 50 MCG/ACT NA SUSP: 1.0000 | Freq: Every day | NASAL | Status: DC | PRN

## 2023-02-03 MED ORDER — MELATONIN 5 MG PO TABS
5.0000 mg | ORAL_TABLET | Freq: Every day | ORAL | Status: DC
  Administered 2023-02-03 – 2023-02-06 (×4): 5 mg via ORAL
  Filled 2023-02-03 (×4): qty 1

## 2023-02-03 MED ORDER — CARBIDOPA-LEVODOPA 25-100 MG PO TABS
2.0000 | ORAL_TABLET | Freq: Three times a day (TID) | ORAL | Status: DC
  Administered 2023-02-04 – 2023-02-07 (×10): 2 via ORAL
  Filled 2023-02-03 (×9): qty 2

## 2023-02-03 MED ORDER — DIPHENHYDRAMINE HCL 50 MG/ML IJ SOLN: 12.5000 mg | Freq: Three times a day (TID) | INTRAMUSCULAR | Status: DC | PRN

## 2023-02-03 MED ORDER — LOPERAMIDE HCL 2 MG PO CAPS: 4.0000 mg | ORAL_CAPSULE | ORAL | Status: DC | PRN

## 2023-02-03 MED ORDER — CAPSAICIN 0.025 % EX CREA
1.0000 | TOPICAL_CREAM | Freq: Two times a day (BID) | CUTANEOUS | Status: DC
  Administered 2023-02-03 – 2023-02-05 (×5): 1 via TOPICAL
  Filled 2023-02-03 (×2): qty 1

## 2023-02-03 MED ORDER — SODIUM CHLORIDE 0.9 % IV BOLUS
1000.0000 mL | Freq: Once | INTRAVENOUS | Status: AC
  Administered 2023-02-03: 1000 mL via INTRAVENOUS

## 2023-02-03 MED ORDER — VITAMIN B-12 1000 MCG PO TABS
1000.0000 ug | ORAL_TABLET | Freq: Every day | ORAL | Status: DC
  Administered 2023-02-04 – 2023-02-07 (×4): 1000 ug via ORAL
  Filled 2023-02-03 (×4): qty 1

## 2023-02-03 MED ORDER — ENTACAPONE 200 MG PO TABS
200.0000 mg | ORAL_TABLET | ORAL | Status: DC
  Administered 2023-02-03 – 2023-02-07 (×21): 200 mg via ORAL
  Filled 2023-02-03 (×24): qty 1

## 2023-02-03 MED ORDER — SODIUM CHLORIDE 0.9 % IV SOLN: INTRAVENOUS | Status: DC

## 2023-02-03 MED ORDER — ACETAMINOPHEN 325 MG PO TABS
650.0000 mg | ORAL_TABLET | Freq: Four times a day (QID) | ORAL | Status: DC | PRN
  Administered 2023-02-04 – 2023-02-06 (×2): 650 mg via ORAL
  Filled 2023-02-03 (×2): qty 2

## 2023-02-03 MED ORDER — EZETIMIBE 10 MG PO TABS
10.0000 mg | ORAL_TABLET | Freq: Every day | ORAL | Status: DC
  Administered 2023-02-04 – 2023-02-07 (×4): 10 mg via ORAL
  Filled 2023-02-03 (×4): qty 1

## 2023-02-03 MED ORDER — SODIUM CHLORIDE 0.9 % IV BOLUS
500.0000 mL | Freq: Once | INTRAVENOUS | Status: AC
  Administered 2023-02-03: 500 mL via INTRAVENOUS

## 2023-02-03 MED ORDER — ASPIRIN 81 MG PO TBEC
81.0000 mg | DELAYED_RELEASE_TABLET | Freq: Every day | ORAL | Status: DC
  Administered 2023-02-04 – 2023-02-07 (×4): 81 mg via ORAL
  Filled 2023-02-03 (×4): qty 1

## 2023-02-03 NOTE — H&P (Signed)
History and Physical    Corey Morrison DOB: [birth year] DOA: 02/03/2023  Referring MD/NP/PA:   PCP: [facility]   Patient coming from:  The patient is coming from assisted living facility   Chief Complaint: Fall, altered mental status  HPI: Corey Morrison is a 67 y.o. male with medical history significant of hyperlipidemia, CAD, depression with anxiety, [name]'s disease, orthostatic status, peripheral neuropathy, GERD, A-fib not on anticoagulants, who presents with fall  Per report,  staff in facility found pt leaning against his bed today. They attempted to stand him up and then he fell forward hitting his head. He developed a small hematoma in the left forehead.  Patient seem to be more confused than baseline.  Per his power of attorney (I called his power of attorney by phone), at normal baseline, patient is orientated to person and place, usually not orientated to the time.  Today patient knows his own name, knows that he is in [place], but not aware that he is in the hospital.  He is not oriented to time today.  He moves all extremities.  No chest pain, cough, shortness breath.  No nausea, vomiting, diarrhea or abdominal pain.  No symptoms of UTI.  Per report, patient had hypotension with systolic blood pressure in 70s, which improved to 126/77 after giving 1 L normal saline bolus in ED.  Data reviewed independently and ED Course: pt was found to have WBC 12.3, negative urinalysis, GFR> 60, troponin level 7, lactic acid 1.6, temperature normal, heart rate 57, 71, RR 27, oxygen saturation 100% on room air.  CT of head is motion limited, but is negative for acute issues.  Chest x-ray negative.  Patient is placed on telemetry bed for position.   EKG: I have personally reviewed.  Sinus rhythm, QTc 524, left bundle blockade, poor R wave progression, LAD.   Review of Systems: Could not be reviewed accurately due to altered mental status.     Allergy: No  Known Allergies  Past Medical History:  Diagnosis Date   A-fib ([place])    Anxiety disorder    Coronary artery disease    Hyperlipidemia    [name] disease    Vitamin B12 deficiency     Past Surgical History:  Procedure Laterality Date   CARDIAC CATHETERIZATION      Social History:  reports that he has quit smoking. He has never used smokeless tobacco. He reports that he does not drink alcohol and does not use drugs.  Family History:  Family History  Problem Relation Age of Onset   Heart Problems Father      Prior to Admission medications   Medication Sig Start Date End Date Taking? Authorizing Provider  acetaminophen (TYLENOL) 325 MG tablet Take 650 mg by mouth every 6 (six) hours as needed for mild pain or moderate pain.    [provider]  aspirin 81 MG EC tablet Take 81 mg by mouth daily. Patient not taking: Reported on 10/17/2022    [provider]  BIOFREEZE 4 % GEL SMARTSIG:sparingly Topical 3 Times Daily 07/14/22   [provider]  capsicum (ZOSTRIX) 0.075 % topical cream Apply 1 Application topically 2 (two) times daily. (Apply to feet and thighs)    [provider]  carbidopa-levodopa (SINEMET IR) 25-100 MG tablet Take 1 tablet by mouth 6 (six) times daily. Patient taking differently: Take 2 tablets by mouth 3 (three) times daily. 07/28/22 10/17/22  Max Sane, MD  carbidopa-levodopa (SINEMET IR) 25-250 MG  tablet Take 1 tablet by mouth 3 (three) times daily.    [provider]  Cholecalciferol (VITAMIN D) 50 MCG (2000 UT) CAPS Take 2,000 Units by mouth daily.    [provider]  entacapone (COMTAN) 200 MG tablet Take 1 tablet (200 mg total) by mouth 6 (six) times daily. 07/28/22 10/17/22  Max Sane, MD  esomeprazole (NEXIUM) 20 MG capsule Take 20 mg by mouth daily. Patient not taking: Reported on 10/17/2022    [provider]  ezetimibe (ZETIA) 10 MG tablet Take 10 mg by mouth daily.    [provider]  fluticasone (FLONASE) 50 MCG/ACT nasal spray Place 1 spray into both nostrils daily.    [provider]  gabapentin (NEURONTIN) 100 MG capsule Take 2 capsules (200 mg total) by mouth 3 (three) times daily. 07/28/22 10/17/22  Max Sane, MD  melatonin 5 MG TABS Take 1 tablet (5 mg total) by mouth at bedtime. 06/28/22   Ezekiel Slocumb, DO  midodrine (PROAMATINE) 2.5 MG tablet Take 2.5 mg by mouth 3 (three) times daily with meals. 10/10/22   [provider]  omeprazole (PRILOSEC) 20 MG capsule Take 20 mg by mouth daily. 10/10/22   [provider]  polyethylene glycol (MIRALAX / GLYCOLAX) 17 g packet Take 17 g by mouth daily as needed for mild constipation. 06/28/22   Nicole Kindred A, DO  psyllium (REGULOID) 0.52 g capsule Take 0.52 g by mouth at bedtime.    [provider]  risperiDONE (RISPERDAL) 0.5 MG tablet TAKE 1 TABLET BY MOUTH 2 TIMES PER DAY Patient taking differently: Take 0.5 mg by mouth in the morning, at noon, and at bedtime. TAKE 1 TABLET BY MOUTH 3 TIMES PER DAY 07/28/22   Max Sane, MD  sertraline (ZOLOFT) 100 MG tablet Take 1 tablet (100 mg total) by mouth daily. 07/28/22 10/17/22  Max Sane, MD  vitamin B-12 (CYANOCOBALAMIN) 1000 MCG tablet Take 1,000 mcg by mouth daily.    [provider]    Physical Exam: Vitals:   02/03/23 1530 02/03/23 1600 02/03/23 1610 02/03/23 1658  BP: (!) 152/115 126/77  (!) 141/112  Pulse: 84 84  80  Resp: 18 (!) 26  18  Temp:   97.8 F (36.6 C) 97.9 F (36.6 C)  TempSrc:   Oral Oral  SpO2: 96% 100%    Weight:      Height:       General: Not in acute distress HEENT:       Eyes: PERRL, EOMI, no scleral icterus.       ENT: No discharge from the ears and nose       Neck: No JVD, no bruit, no mass felt. Heme: No neck lymph node enlargement. Cardiac: S1/S2, RRR, No murmurs, No gallops or rubs. Respiratory: No rales, wheezing, rhonchi or rubs. GI: Soft, nondistended, nontender, no  organomegaly, BS present. GU: No hematuria Ext: No pitting leg edema bilaterally. 1+DP/PT pulse bilaterally. Musculoskeletal: No joint deformities, No joint redness or warmth, no limitation of ROM in spin. Skin: has hematoma in the left forehead Neuro: Confused, knows his own name, knows that he is in [place], but not aware of being hospital, not oriented to time. Cranial nerves II-XII grossly intact, moves all extremities. Psych: Patient is not psychotic, no suicidal or hemocidal ideation.  Labs on Admission: I have personally reviewed following labs and imaging studies  CBC: Recent Labs  Lab 02/01/23 0858 02/03/23 1202  WBC 7.2 12.3*  NEUTROABS 6.0  --  HGB 10.3* 10.1*  HCT 31.9* 31.6*  MCV 96.1 96.0  PLT 151 [id number]*   Basic Metabolic Panel: Recent Labs  Lab 02/01/23 0858 02/03/23 1202  NA 141 138  K 4.7 4.3  CL 108 105  CO2 25 25  GLUCOSE 104* 102*  BUN 31* 36*  CREATININE 1.00 1.10  CALCIUM 8.9 8.8*   GFR: Estimated Creatinine Clearance: 62.7 mL/min (by C-G formula based on SCr of 1.1 mg/dL). Liver Function Tests: Recent Labs  Lab 02/03/23 1202  AST 14*  ALT <5  ALKPHOS 71  BILITOT 0.9  PROT 6.8  ALBUMIN 3.8   No results for input(s): "LIPASE", "AMYLASE" in the last 168 hours. No results for input(s): "AMMONIA" in the last 168 hours. Coagulation Profile: No results for input(s): "INR", "PROTIME" in the last 168 hours. Cardiac Enzymes: No results for input(s): "CKTOTAL", "CKMB", "CKMBINDEX", "TROPONINI" in the last 168 hours. BNP (last 3 results) No results for input(s): "PROBNP" in the last 8760 hours. HbA1C: No results for input(s): "HGBA1C" in the last 72 hours. CBG: Recent Labs  Lab 02/03/23 1150  GLUCAP 96   Lipid Profile: No results for input(s): "CHOL", "HDL", "LDLCALC", "TRIG", "CHOLHDL", "LDLDIRECT" in the last 72 hours. Thyroid Function Tests: No results for input(s): "TSH", "T4TOTAL", "FREET4", "T3FREE", "THYROIDAB" in the last 72  hours. Anemia Panel: No results for input(s): "VITAMINB12", "FOLATE", "FERRITIN", "TIBC", "IRON", "RETICCTPCT" in the last 72 hours. Urine analysis:    Component Value Date/Time   COLORURINE AMBER (A) 02/03/2023 1313   APPEARANCEUR CLEAR (A) 02/03/2023 1313   LABSPEC 1.019 02/03/2023 1313   PHURINE 6.0 02/03/2023 1313   GLUCOSEU NEGATIVE 02/03/2023 1313   HGBUR NEGATIVE 02/03/2023 1313   BILIRUBINUR NEGATIVE 02/03/2023 1313   KETONESUR 5 (A) 02/03/2023 1313   PROTEINUR NEGATIVE 02/03/2023 1313   NITRITE NEGATIVE 02/03/2023 1313   LEUKOCYTESUR NEGATIVE 02/03/2023 1313   Sepsis Labs: '@LABRCNTIP'$ (procalcitonin:4,lacticidven:4) ) Recent Results (from the past 240 hour(s))  Resp panel by RT-PCR (RSV, Flu A&B, Covid) Anterior Nasal Swab     Status: None   Collection Time: 02/01/23  8:58 AM   Specimen: Anterior Nasal Swab  Result Value Ref Range Status   SARS Coronavirus 2 by RT PCR NEGATIVE NEGATIVE Final    Comment: (NOTE) SARS-CoV-2 target nucleic acids are NOT DETECTED.  The SARS-CoV-2 RNA is generally detectable in upper respiratory specimens during the acute phase of infection. The lowest concentration of SARS-CoV-2 viral copies this assay can detect is 138 copies/mL. A negative result does not preclude SARS-Cov-2 infection and should not be used as the sole basis for treatment or other patient management decisions. A negative result may occur with  improper specimen collection/handling, submission of specimen other than nasopharyngeal swab, presence of viral mutation(s) within the areas targeted by this assay, and inadequate number of viral copies(<138 copies/mL). A negative result must be combined with clinical observations, patient history, and epidemiological information. The expected result is Negative.  Fact Sheet for Patients:  EntrepreneurPulse.com.au  Fact Sheet for Healthcare Providers:  IncredibleEmployment.be  This test is  no t yet approved or cleared by the [place] FDA and  has been authorized for detection and/or diagnosis of SARS-CoV-2 by FDA under an Emergency Use Authorization (EUA). This EUA will remain  in effect (meaning this test can be used) for the duration of the COVID-19 declaration under Section 564(b)(1) of the Act, 21 U.S.C.section 360bbb-3(b)(1), unless the authorization is terminated  or revoked sooner.       Influenza A  by PCR NEGATIVE NEGATIVE Final   Influenza B by PCR NEGATIVE NEGATIVE Final    Comment: (NOTE) The Xpert Xpress SARS-CoV-2/FLU/RSV plus assay is intended as an aid in the diagnosis of influenza from Nasopharyngeal swab specimens and should not be used as a sole basis for treatment. Nasal washings and aspirates are unacceptable for Xpert Xpress SARS-CoV-2/FLU/RSV testing.  Fact Sheet for Patients: EntrepreneurPulse.com.au  Fact Sheet for Healthcare Providers: IncredibleEmployment.be  This test is not yet approved or cleared by the [place] FDA and has been authorized for detection and/or diagnosis of SARS-CoV-2 by FDA under an Emergency Use Authorization (EUA). This EUA will remain in effect (meaning this test can be used) for the duration of the COVID-19 declaration under Section 564(b)(1) of the Act, 21 U.S.C. section 360bbb-3(b)(1), unless the authorization is terminated or revoked.     Resp Syncytial Virus by PCR NEGATIVE NEGATIVE Final    Comment: (NOTE) Fact Sheet for Patients: EntrepreneurPulse.com.au  Fact Sheet for Healthcare Providers: IncredibleEmployment.be  This test is not yet approved or cleared by the [place] FDA and has been authorized for detection and/or diagnosis of SARS-CoV-2 by FDA under an Emergency Use Authorization (EUA). This EUA will remain in effect (meaning this test can be used) for the duration of the COVID-19 declaration under Section  564(b)(1) of the Act, 21 U.S.C. section 360bbb-3(b)(1), unless the authorization is terminated or revoked.  Performed at [facility], [address] [place], [place] [address]      Radiological Exams on Admission: CT Head Wo Contrast  Result Date: 02/03/2023 CLINICAL DATA:  Head trauma, minor (Age >= 65y) EXAM: CT HEAD WITHOUT CONTRAST TECHNIQUE: Contiguous axial images were obtained from the base of the skull through the vertex without intravenous contrast. RADIATION DOSE REDUCTION: This exam was performed according to the departmental dose-optimization program which includes automated exposure control, adjustment of the mA and/or kV according to patient size and/or use of iterative reconstruction technique. COMPARISON:  CT head February 01, 2023. FINDINGS: Motion limited study. Brain: No evidence of acute infarction, hemorrhage, hydrocephalus, extra-axial collection or mass lesion/mass effect. Vascular: No hyperdense vessel identified. Skull: No acute fracture. Sinuses/Orbits: Clear sinuses.  No acute orbital findings. Other: No mastoid effusions. IMPRESSION: Motion limited study without evidence of acute abnormality. Electronically Signed   By: Margaretha Sheffield M.D.   On: 02/03/2023 12:38   DG Chest 1 View  Result Date: 02/03/2023 CLINICAL DATA:  Fall. EXAM: CHEST  1 VIEW COMPARISON:  November 06, 2022. FINDINGS: The heart size and mediastinal contours are within normal limits. Both lungs are clear. The visualized skeletal structures are unremarkable. IMPRESSION: No active disease. Electronically Signed   By: Marijo Conception M.D.   On: 02/03/2023 12:22      Assessment/Plan Principal Problem:   Fall Active Problems:   Hypotension   Acute metabolic encephalopathy   Orthostatic hypotension   [name]'s disease   Paroxysmal atrial fibrillation (HCC)   CAD (coronary artery disease)   Leukocytosis   Depression with anxiety   Normocytic anemia   Prolonged QT  interval   Assessment and Plan:    Fall due to hypotension: History most likely due to hypotension.  Initial blood pressure was in 70s, which responded to IV fluid resuscitation.  After giving 1 L normal saline, blood pressure improved to 126/77.  Etiology for hypotension is not clear.  Patient has mild leukocytosis, but no source of infection identified.  Urinalysis negative.  Chest x-ray negative.  May be due  to dehydration.  -Placed on TeleMed follow-up patient -IV fluid: 1.5 L normal saline, 100 cc/h -Midodrine 5 mg 3 times daily -Fall precaution -PT/OT  Acute metabolic encephalopathy: CT head negative for acute issues.  Etiology is not clear.  Urinalysis negative.   -Frequent neurocheck -Fall precaution  Orthostatic hypotension - midodrine 5 mg 3 times daily -IV fluid as above  [name]'s disease -Sinemet  Paroxysmal atrial fibrillation [facility]): Heart rate 57, 71, not on anticoagulants due to high risk of fall -tele monitoring  CAD (coronary artery disease) -Aspirin  Leukocytosis: WBC 12.3, no fever, no source of infection identified. -Follow-up blood culture  Depression with anxiety -Hold risperidone, Zoloft due to QTc prolongation -Continue entacapone  Normocytic anemia: Hemoglobin stable, 10.1. -Follow-up with CBC  Prolonged QT interval -Avoid using QT prolonging medications, suggested Zofran -Hold risperidone and Zoloft      DVT ppx: SCD  Code Status: DNR per his power of attorney  Family Communication:   Yes, patient's power of attorney   by phone  Disposition Plan:  Anticipate discharge back to previous environment  Consults called: None  Admission status and Level of care: Telemetry Medical:    for obs    Dispo: The patient is from: ALF              Anticipated d/c is to: ALF              Anticipated d/c date is: 1 day              Patient currently is not medically stable to d/c.    Severity of Illness:  The appropriate patient status  for this patient is OBSERVATION. Observation status is judged to be reasonable and necessary in order to provide the required intensity of service to ensure the patient's safety. The patient's presenting symptoms, physical exam findings, and initial radiographic and laboratory data in the context of their medical condition is felt to place them at decreased risk for further clinical deterioration. Furthermore, it is anticipated that the patient will be medically stable for discharge from the hospital within 2 midnights of admission.        Date of Service 02/03/2023    Ivor Costa [facility]   If 7PM-7AM, please contact night-coverage [email or web address] 02/03/2023, 5:47 PM

## 2023-02-03 NOTE — ED Notes (Signed)
Pt eating boxed lunch at this time. Denies other needs

## 2023-02-03 NOTE — ED Triage Notes (Signed)
Patient brought in by EMS from [facility] for unwitnessed fall; EMS states that a staff member found him kneeling at the foot of the bed and helped him back into the bed and he fell hitting his head on the ground (hemotoma noted to forehead); Patient is confused and was confused at scene but staff told EMS "he's probably still in his  [name]'s state" ... EMS asked to elaborate and explain and staff said "this happens with his [name]'s"; Initial BP with EMS 74/35 ---> 70/28 ---> 97/50

## 2023-02-03 NOTE — ED Provider Notes (Signed)
[facility] Provider Note    Event Date/Time   First MD Initiated Contact with Patient 02/03/23 1150     (approximate)   History   Fall (Patient brought in by EMS from [facility] for unwitnessed fall; EMS states that a staff member found him kneeling at the foot of the bed and helped him back into the bed and he fell hitting his head on the ground (hemotoma noted to forehead); Patient is confused and was confused at scene but staff told EMS "he's probably still in his  [name]'s state" ... EMS asked to elaborate and explain and staff said "this happens with his [name]'s") and Hypotension (Initial BP with EMS 74/35 ---> 70/28 ---> 97/50)   HPI  Corey Morrison is a 67 y.o. male past medical history significant for [name]'s disease, atrial fibrillation not on anticoagulation, midodrine use, who presents to the emergency department following a fall.  Patient had an unwitnessed fall at the [facility].  Independent living facility.  States that staff came in and they found him leaning against his bed.  Attempted to stand him up and then he fell forward hitting his head.  Patient is only oriented to person.  Confused and unable to give much other history, does not know what caused him to fall or recall the fall.  Nursing staff stated that sometimes he has significant confusion from his [name]'s disease but this is not his normal.  When EMS initially arrived his blood pressure was 70 systolic, by the time they got into the truck it was 97 systolic.  No IV intervention prior to arrival.     Physical Exam   Triage Vital Signs: ED Triage Vitals [02/03/23 1158]  Enc Vitals Group     BP 132/69     Pulse Rate 66     Resp 14     Temp 98 F (36.7 C)     Temp Source Oral     SpO2 98 %     Weight 150 lb (68 kg)     Height '5\' 9"'$  (1.753 m)     Head Circumference      Peak Flow      Pain Score 0     Pain Loc      Pain Edu?      Excl. in [place]?     Most recent vital  signs: Vitals:   02/03/23 1400 02/03/23 1430  BP: (!) 152/83 (!) 151/102  Pulse: 61 78  Resp: 16 (!) 22  Temp:    SpO2: 100% 100%    Physical Exam Constitutional:      Appearance: He is well-developed.  HENT:     Head:     Comments: Abrasion to the left forehead Eyes:     Conjunctiva/sclera: Conjunctivae normal.     Pupils: Pupils are equal, round, and reactive to light.  Cardiovascular:     Rate and Rhythm: Regular rhythm.  Pulmonary:     Effort: No respiratory distress.  Abdominal:     Tenderness: There is no abdominal tenderness.  Musculoskeletal:     Cervical back: No tenderness.     Right lower leg: No edema.     Left lower leg: No edema.  Skin:    General: Skin is warm.     Capillary Refill: Capillary refill takes less than 2 seconds.  Neurological:     Mental Status: He is alert. He is disoriented.     Comments: Following simple commands.  No pain  and normal sensation to bilateral upper and lower extremities.  Normal strength bilateral upper and lower extremities.     IMPRESSION / MDM / ASSESSMENT AND PLAN / ED COURSE  I reviewed the triage vital signs and the nursing notes.  On chart review patient has a history of [name]'s disease, atrial fibrillation not on anticoagulation and lives in an assisted nursing facility.  Patient was evaluated in the emergency department 2 days ago following a fall.  Patient initially with a low blood pressure with EMS, will repeat blood pressure in the emergency department was normal.  Patient has an active DNR.  Also takes midodrine and it appears that he received his midodrine today at just prior to arrival.  Differential diagnosis including dehydration, intracranial hemorrhage, infectious process, electrolyte abnormality, ACS, dysrhythmia, pneumonia  EKG  I, Nathaniel Man, the attending physician, personally viewed and interpreted this ECG.   Rate: Normal  Rhythm: Normal sinus  Axis: Normal  Intervals: Normal  ST&T  Change: None  No tachycardic or bradycardic dysrhythmias while on cardiac telemetry.  RADIOLOGY I independently reviewed imaging, my interpretation of imaging: CT head, chest x-ray -no acute findings.  Read as no acute findings.  Motion artifact to CT scan of the head.  LABS (all labs ordered are listed, but only abnormal results are displayed) Labs interpreted as -    Labs Reviewed  CBC - Abnormal; Notable for the following components:      Result Value   WBC 12.3 (*)    RBC 3.29 (*)    Hemoglobin 10.1 (*)    HCT 31.6 (*)    Platelets 149 (*)    All other components within normal limits  COMPREHENSIVE METABOLIC PANEL - Abnormal; Notable for the following components:   Glucose, Bld 102 (*)    BUN 36 (*)    Calcium 8.8 (*)    AST 14 (*)    All other components within normal limits  URINALYSIS, W/ REFLEX TO CULTURE (INFECTION SUSPECTED) - Abnormal; Notable for the following components:   Color, Urine AMBER (*)    APPearance CLEAR (*)    Ketones, ur 5 (*)    All other components within normal limits  CULTURE, BLOOD (SINGLE)  LACTIC ACID, PLASMA  CBG MONITORING, ED  TROPONIN I (HIGH SENSITIVITY)    TREATMENT  1 L of IV fluid  MDM  Blood pressure improved following IV fluids.  No significant electrolyte abnormalities.  CT scan without signs of intracranial hemorrhage or infarction.  Chest x-ray with no signs of pneumonia.  UA without signs of urinary tract infection.  On reevaluation patient continues to be confused and altered.  States that he is looking through a kaleidoscope.  Unable to follow basic commands or care for himself.  Lives in independent living facility.  Consulted hospitalist for admission for altered mental status and frequent falls.  Possible CVA versus progression of his [name]'s disease.     PROCEDURES:  Critical Care performed: No  Procedures  Patient's presentation is most consistent with acute presentation with potential threat to life or  bodily function.   MEDICATIONS ORDERED IN ED: Medications  sodium chloride 0.9 % bolus 1,000 mL (1,000 mLs Intravenous New Bag/Given 02/03/23 1313)    FINAL CLINICAL IMPRESSION(S) / ED DIAGNOSES   Final diagnoses:  Fall, initial encounter  Injury of head, initial encounter  Altered mental status, unspecified altered mental status type     Rx / DC Orders   ED Discharge Orders  None        Note:  This document was prepared using Dragon voice recognition software and may include unintentional dictation errors.   Nathaniel Man, MD 02/03/23 [phone]

## 2023-02-03 NOTE — Progress Notes (Signed)
[place] notified by facility. RN updated Mr. Wynetta Emery at the bedside.   Fuller Mandril, RN

## 2023-02-04 ENCOUNTER — Encounter: Payer: Self-pay | Admitting: Internal Medicine

## 2023-02-04 DIAGNOSIS — W19XXXA Unspecified fall, initial encounter: Secondary | ICD-10-CM | POA: Diagnosis not present

## 2023-02-04 DIAGNOSIS — R4182 Altered mental status, unspecified: Secondary | ICD-10-CM | POA: Diagnosis not present

## 2023-02-04 DIAGNOSIS — G20A1 Parkinson's disease without dyskinesia, without mention of fluctuations: Secondary | ICD-10-CM | POA: Diagnosis not present

## 2023-02-04 DIAGNOSIS — I951 Orthostatic hypotension: Secondary | ICD-10-CM | POA: Diagnosis not present

## 2023-02-04 LAB — CBC
HCT: 27.7 % — ABNORMAL LOW (ref 39.0–52.0)
Hemoglobin: 8.9 g/dL — ABNORMAL LOW (ref 13.0–17.0)
MCH: 30.7 pg (ref 26.0–34.0)
MCHC: 32.1 g/dL (ref 30.0–36.0)
MCV: 95.5 fL (ref 80.0–100.0)
Platelets: 136 10*3/uL — ABNORMAL LOW (ref 150–400)
RBC: 2.9 MIL/uL — ABNORMAL LOW (ref 4.22–5.81)
RDW: 13.2 % (ref 11.5–15.5)
WBC: 11.7 10*3/uL — ABNORMAL HIGH (ref 4.0–10.5)
nRBC: 0 % (ref 0.0–0.2)

## 2023-02-04 LAB — BASIC METABOLIC PANEL
Anion gap: 5 (ref 5–15)
BUN: 24 mg/dL — ABNORMAL HIGH (ref 8–23)
CO2: 27 mmol/L (ref 22–32)
Calcium: 8.5 mg/dL — ABNORMAL LOW (ref 8.9–10.3)
Chloride: 109 mmol/L (ref 98–111)
Creatinine, Ser: 0.83 mg/dL (ref 0.61–1.24)
GFR, Estimated: >60 mL/min (ref >60)
Glucose, Bld: 94 mg/dL (ref 70–99)
Potassium: 4.1 mmol/L (ref 3.5–5.1)
Sodium: 141 mmol/L (ref 135–145)

## 2023-02-04 NOTE — Progress Notes (Signed)
Progress Note   Patient: Corey Morrison [id number] DOB: 11/19/1956 DOA: 02/03/2023     0 DOS: the patient was seen and examined on 02/04/2023   Brief hospital course: Taken from H&P. The patient is coming from assisted living facility    Jackston Montanez is a 67 y.o. male with medical history significant of hyperlipidemia, CAD, depression with anxiety, [name]'s disease, orthostatic status, peripheral neuropathy, GERD, A-fib not on anticoagulants, who presents with fall. Per report,  staff in facility found pt leaning against his bed today. They attempted to stand him up and then he fell forward hitting his head. He developed a small hematoma in the left forehead.  Patient seem to be more confused than baseline.  At baseline, patient is oriented to person and place, usually not to time.  Patient with history of multiple falls and ED visits for that reason.  Per report, patient had hypotension with systolic blood pressure in 70s, which improved to 126/77 after giving 1 L normal saline bolus in ED.   Data reviewed independently and ED Course: pt was found to have WBC 12.3, negative urinalysis, GFR> 60, troponin level 7, lactic acid 1.6, temperature normal, heart rate 57, 71, RR 27, oxygen saturation 100% on room air.  CT of head is motion limited, but is negative for acute issues.  Chest x-ray negative.  EKG: Sinus rhythm, QTc 524, left bundle blockade, poor R wave progression, LAD.   3/9: Vitals with mildly elevated blood pressure at 143/70.  Some improvement of leukocytosis to 11.7, hemoglobin decreased to 8.9 with no obvious bleeding, all cell lines decreased so some dilutional effect.  Patient with history of multiple falls, most likely autonomic dysfunction with his [name]'s disease.  Preliminary blood cultures negative.  Very difficult to obtain orthostatic vitals as patient was becoming very symptomatic and increased risk of fall. PT and OT are recommending SNF.  TOC is looking for  placement       Assessment and Plan: * Fall Symptoms and history is more consistent with autonomic dysfunction and orthostatic vitals.  Most likely secondary to underlying advanced parkinsonism. PT/OT are recommending SNF. -Will continue home midodrine -Fall precautions  Orthostatic hypotension Unable to obtain proper orthostatic vitals as patient was becoming very symptomatic and unable to stand. Most likely autonomic dysfunction with advanced [name]'s. -Continue home midodrine  Acute metabolic encephalopathy Mild to be due to worsening [name]'s.  Currently appears to be at his baseline where he is oriented to self and place.  CT head negative for any acute abnormality.  UA negative for UTI. -Continue to monitor  [name]'s disease -Continue Sinemet  Paroxysmal atrial fibrillation (HCC) Heart rate well-controlled.  Not on any anticoagulation due to high risk of fall  CAD (coronary artery disease) -Continue aspirin  Leukocytosis May be reactive with fall.  Improving.  No obvious infection.  Blood cultures remain negative in 24 hours. -Follow-up final culture results  Depression with anxiety -Hold risperidone, Zoloft due to QTc prolongation -Continue entacapone  Normocytic anemia Some decrease of hemoglobin to 8.9.  No obvious bleeding.  All cell lines decreased so some dilutional effect. -Continue to monitor  Prolonged QT interval -Avoid using QT prolonging medications, suggested Zofran -Hold risperidone and Zoloft   Subjective: Patient was seen and examined today.  Alert and oriented x 2.  Unable to explain any symptoms.  Physical Exam: Vitals:   02/04/23 0804 02/04/23 0926 02/04/23 0931 02/04/23 0935  BP: (!) 143/70 114/61 (!) 124/91 (!) 162/146  Pulse: 81 65 73  76  Resp: '20 18 19 19  '$ Temp: 98.3 F (36.8 C) 98.7 F (37.1 C) 98.6 F (37 C) 98.6 F (37 C)  TempSrc: Oral     SpO2: 97% 99% 100% 90%  Weight:      Height:       General. Frail  gentleman, In no acute distress. Pulmonary.  Lungs clear bilaterally, normal respiratory effort. CV.  Regular rate and rhythm, no JVD, rub or murmur. Abdomen.  Soft, nontender, nondistended, BS positive. CNS.  Alert and oriented X 2 .  No focal neurologic deficit. Extremities.  No edema, no cyanosis, pulses intact and symmetrical. Psychiatry.  Judgment and insight appears impaired.  Data Reviewed: Prior data reviewed.  Family Communication: Discussed with [place]  Disposition: Status is: Observation The patient remains OBS appropriate and will d/c before 2 midnights.  Planned Discharge Destination: Skilled nursing facility  Time spent: 45 minutes  This record has been created using Systems analyst. Errors have been sought and corrected,but may not always be located. Such creation errors do not reflect on the standard of care.   Author: Lorella Nimrod, MD 02/04/2023 2:08 PM  For on call review [email or web address].

## 2023-02-04 NOTE — Assessment & Plan Note (Signed)
Continue aspirin 

## 2023-02-04 NOTE — Assessment & Plan Note (Signed)
Unable to obtain proper orthostatic vitals as patient was becoming very symptomatic and unable to stand. Most likely autonomic dysfunction with advanced [name]'s. -Continue home midodrine

## 2023-02-04 NOTE — Assessment & Plan Note (Signed)
Continue Sinemet ?

## 2023-02-04 NOTE — TOC Initial Note (Signed)
Transition of Care [facility]) - Initial/Assessment Note    Patient Details  Name: Corey Morrison MRN: [id number] Date of Birth: September 09, 1956  Transition of Care [facility]) CM/SW Contact:    Valente David, RN Phone Number: 02/04/2023, 9:53 AM  Clinical Narrative:                  Spoke with POA Ermelinda Das, state patient is now living at [facility] at [place].  Notified that recommendations are for patient to be discharged to SNF for rehab, he agrees.  FL2 completed, bed search initiated to [facility], [place]).  TOC will continue to follow.   Expected Discharge Plan: Skilled Nursing Facility Barriers to Discharge: Continued Medical Work up   Patient Goals and CMS Choice Patient states their goals for this hospitalization and ongoing recovery are:: Per POA, back to ALF CMS Medicare.gov Compare Post Acute Care list provided to:: Patient Represenative (must comment) Liliane Channel) Choice offered to / list presented to : [place] / Guardian      Expected Discharge Plan and Services     Post Acute Care Choice: [place] Living arrangements for the past 2 months: [place]                   DME Agency: NA                  Prior Living Arrangements/Services Living arrangements for the past 2 months: [place] Lives with:: Facility Resident Patient language and need for interpreter reviewed:: No Do you feel safe going back to the place where you live?: Yes      Need for Family Participation in Patient Care: Yes (Comment) Care giver support system in place?: Yes (comment) Current home services: DME Criminal Activity/Legal Involvement Pertinent to Current Situation/Hospitalization: No - Comment as needed  Activities of Daily Living Home Assistive Devices/Equipment: Wheelchair, Environmental consultant (specify type) ADL Screening (condition at time of admission) Is the patient deaf or have difficulty hearing?: No Does the  patient have difficulty seeing, even when wearing glasses/contacts?: No Does the patient have difficulty concentrating, remembering, or making decisions?: Yes Patient able to express need for assistance with ADLs?: Yes Does the patient have difficulty dressing or bathing?: Yes Independently performs ADLs?: No Communication: Independent Feeding: Independent Does the patient have difficulty walking or climbing stairs?: Yes Weakness of Legs: Both Weakness of Arms/Hands: Both  Permission Sought/Granted   Permission granted to share information with : Yes, Verbal Permission Granted  Share Information with NAME: SNF  Permission granted to share info w AGENCY: [facility] [place] [facility] granted to share info w Relationship: POA  Permission granted to share info w Contact Information: [phone]  Emotional Assessment       Orientation: : Oriented to Self, Oriented to Place   Psych Involvement: No (comment)  Admission diagnosis:  Fall [W19.XXXA] Injury of head, initial encounter [id number] Fall, initial encounter [W19.XXXA] Altered mental status, unspecified altered mental status type [R41.82] Patient Active Problem List   Diagnosis Date Noted   Fall 02/03/2023   CAD (coronary artery disease) 02/03/2023   Depression with anxiety 02/03/2023   Hypotension 02/03/2023   Leukocytosis Q000111Q   Acute metabolic encephalopathy Q000111Q   Prolonged QT interval 02/03/2023   Hallucination 07/27/2022   Peripheral neuropathy 07/27/2022   [name] disease, symptomatic 07/23/2022   GERD without esophagitis 07/21/2022   Dyslipidemia 07/21/2022   Vitamin B12 deficiency 07/21/2022   Depression  07/21/2022   Weakness    Nocturnal cough 06/25/2022   Insomnia 06/25/2022   Orthostatic hypotension 06/23/2022   Paroxysmal atrial fibrillation ([place]) 06/23/2022   Normocytic anemia 06/23/2022   Ambulatory dysfunction 06/22/2022   [name]'s disease 06/11/2022   PCP:   [facility]:   [place], [place] [place] [address] Phone: [phone] Fax: [phone]     Social Determinants of Health (SDOH) Social History: SDOH Screenings   Tobacco Use: Medium Risk (02/03/2023)   SDOH Interventions:     Readmission Risk Interventions    07/23/2022    4:25 PM  Readmission Risk Prevention Plan  Transportation Screening Complete  Medication Review (RN Care Manager) Complete  PCP or Specialist appointment within 3-5 days of discharge Complete  HRI or [place] Complete  SW Recovery Care/Counseling Consult Complete  Palliative Care Screening Complete  Skilled Nursing Facility Complete

## 2023-02-04 NOTE — Progress Notes (Signed)
   02/04/23 0930  Spiritual Encounters  Type of Visit Initial  Care provided to: Patient  Conversation partners present during encounter Nurse  Referral source Nurse (RN/NT/LPN)  Reason for visit Routine spiritual support  OnCall Visit Yes   Chaplain responded to [facility] consult. Patient being attended to by care team. Chaplain communicated to patient that he would return.

## 2023-02-04 NOTE — Hospital Course (Addendum)
Taken from H&P. The patient is coming from assisted living facility    Corey Morrison is a 67 y.o. male with medical history significant of hyperlipidemia, CAD, depression with anxiety, [name]'s disease, orthostatic status, peripheral neuropathy, GERD, A-fib not on anticoagulants, who presents with fall. Per report,  staff in facility found pt leaning against his bed today. They attempted to stand him up and then he fell forward hitting his head. He developed a small hematoma in the left forehead.  Patient seem to be more confused than baseline.  At baseline, patient is oriented to person and place, usually not to time.  Patient with history of multiple falls and ED visits for that reason.  Per report, patient had hypotension with systolic blood pressure in 70s, which improved to 126/77 after giving 1 L normal saline bolus in ED.   Data reviewed independently and ED Course: pt was found to have WBC 12.3, negative urinalysis, GFR> 60, troponin level 7, lactic acid 1.6, temperature normal, heart rate 57, 71, RR 27, oxygen saturation 100% on room air.  CT of head is motion limited, but is negative for acute issues.  Chest x-ray negative.  EKG: Sinus rhythm, QTc 524, left bundle blockade, poor R wave progression, LAD.   3/9: Vitals with mildly elevated blood pressure at 143/70.  Some improvement of leukocytosis to 11.7, hemoglobin decreased to 8.9 with no obvious bleeding, all cell lines decreased so some dilutional effect.  Patient with history of multiple falls, most likely autonomic dysfunction with his [name]'s disease.  Preliminary blood cultures negative.  Very difficult to obtain orthostatic vitals as patient was becoming very symptomatic and increased risk of fall. PT and OT are recommending SNF.  TOC is looking for placement  3/10: Vital stable.  Leukocytosis resolved.  Hemoglobin at 9.3 which is around his baseline.  Preliminary blood cultures remain negative.  Nursing concern of worsening  lethargy and dyskinesias which can be due to [name]'s.  Neurology was also consulted.  They are recommending MRI, EEG as he will be at high risk for stroke due to A-fib and not being on any anticoagulation.  Repeat EKG with improved QTc , restarting risperidone for hallucinations. Awaiting SNF placement.  3./11: Hemodynamically stable.  MRI brain was negative for any acute abnormality. Medically stable and awaiting SNF placement.  3/12: Hemodynamically stable.  EEG with mild diffuse slowing indicative of global cerebral dysfunction.  No epileptiform abnormalities seen.  Obtained insurance authorization for SNF and patient is being discharged to rehab for further management.  Patient currently needing full assistance with meals and for his ADLs.  If does not improve with short-term rehab they might have to look for long-term care.  Also concern of autonomic dysfunction secondary to advanced [name]'s.  Patient takes Sinemet 6 times daily, with the 1 tab is at 1000/1400/2100 and the 2 tablets 0700/1200/1700.  He will continue on current regimen and need to have a close follow-up with his providers for further recommendations.

## 2023-02-04 NOTE — Assessment & Plan Note (Signed)
-  Avoid using QT prolonging medications, suggested Zofran -Hold risperidone and Zoloft

## 2023-02-04 NOTE — Evaluation (Signed)
Occupational Therapy Evaluation Patient Details Name: Corey Morrison MRN: [id number] DOB: [birth year] Today's Date: 02/04/2023   History of Present Illness Pt is a 67 y.o. male with medical history significant of hyperlipidemia, CAD, depression with anxiety, [name]'s disease, orthostatic status, peripheral neuropathy, GERD, A-fib not on anticoagulants, who presents with fall. MD assessment includes: Fall due to hypotension, acute metabolic encephalopathy, orthostatic hypotension, PD, leukocytosis, normocytic anemia, and prolonged QT interval.   Clinical Impression   Patient received for OT evaluation. See flowsheet below for details of function. Generally, patient requiring MAX A for bed mobility, unable to perform functional mobility, and MOD A-MAX A for ADLs. Pt pleasant and agreeable; flat affect. Bed level evaluation today 2/2 pt having just worked with PT and PT reporting MAX A and difficulty t/f to EOB. Difficult to ascertain if pt is having a cognitive deficit vs ataxia/dysmetria from parkinsons disease, or combination.  Patient will benefit from continued OT while in acute care.      Recommendations for follow up therapy are one component of a multi-disciplinary discharge planning process, led by the attending physician.  Recommendations may be updated based on patient status, additional functional criteria and insurance authorization.   Follow Up Recommendations  Skilled nursing-short term rehab (<3 hours/day)     Assistance Recommended at Discharge Frequent or constant Supervision/Assistance  Patient can return home with the following Two people to help with walking and/or transfers;Two people to help with bathing/dressing/bathroom;Assistance with cooking/housework;Assistance with feeding;Direct supervision/assist for medications management;Direct supervision/assist for financial management;Assist for transportation;Help with stairs or ramp for entrance    Functional Status  Assessment  Patient has had a recent decline in their functional status and demonstrates the ability to make significant improvements in function in a reasonable and predictable amount of time.  Equipment Recommendations  Other (comment) (defer to next venue of care)    Recommendations for Other Services       Precautions / Restrictions Precautions Precautions: Fall Restrictions Weight Bearing Restrictions: No      Mobility Bed Mobility Overal bed mobility: Needs Assistance Bed Mobility: Rolling Rolling: Max assist              Transfers                   General transfer comment: Did not transfer today; already tested by PT this morning      Balance                                           ADL either performed or assessed with clinical judgement   ADL Overall ADL's : Needs assistance/impaired   Eating/Feeding Details (indicate cue type and reason): Anticipate supervision and intermittent assistance; may have difficulty using silverware at this time Grooming: Bed level;Wash/dry face;Set up;Oral care;Moderate assistance;Wash/dry hands;Maximal assistance Grooming Details (indicate cue type and reason): BIL UE dysmetria/coordination deficits impairing semi-reclined grooming today. Able to wash face with warm washcloth with BIL UE grossly; not very thorough. Pt initially put toothbrush in mouth handle-side first; required assist of OT to place toothpaste on toothbrush. OT used hand-under-hand assistance for MOD A; pt participatory in pushing the toothbrush, but when OT removed hand from task pt stopped the task; required hands-on for completion. With hand hygiene with wet washcloth, OT cued maximally for pt to open hands and rub washcloth; however, before OT could hand pt washcloth he clasped his  hands together with interlacing fingers and rubbed; desipte visual, verbal, and tactile cues, pt did not open hands much or take washcloth; OT assisted MAX.    Upper Body Bathing Details (indicate cue type and reason): anticipate MOD A   Lower Body Bathing Details (indicate cue type and reason): anticipate MAX A   Upper Body Dressing Details (indicate cue type and reason): changed gown today; MAX A; pt with dysmetria in placing hands through sleeves of gown   Lower Body Dressing Details (indicate cue type and reason): anticipate dependent bed level at this time   Toilet Transfer Details (indicate cue type and reason): not safe at this time   Toileting - Clothing Manipulation Details (indicate cue type and reason): MAX A rolling with cues, pt using R hand to help on bed rail, bedpan placement dependent; anticipate dependent hygiene (NT to assist pt when he was finished on bedpan)   Tub/Shower Transfer Details (indicate cue type and reason): unsafe at this time   General ADL Comments: Per PT report from earlier session, pt not safe for functional mobility at this time and requiring x2 to get to EOB and stand.     Vision Baseline Vision/History:  (unknown. Pt not wearing glasses today. Unclear if he has them.)       Perception     Praxis      Pertinent Vitals/Pain Pain Assessment Pain Assessment: No/denies pain     Hand Dominance Right   Extremity/Trunk Assessment Upper Extremity Assessment Upper Extremity Assessment: RUE deficits/detail;LUE deficits/detail RUE Deficits / Details: BIL UE deficits in dysmetria (vs visual deficit?); difficult to ascertain. Pt BIL UE rigid during session. Difficult to use during [facility] activity. No tremors noted in BIL UE. Often difficulty following commands for BIL UE or reaching for objects in his field of vision (such as OT providing washcloth in front of him and pt unable to reach for it, although ROM does not appear to be the issue). Able to use RUE to pull self over with assist on bedrail for rolling.   Lower Extremity Assessment Lower Extremity Assessment: Defer to PT evaluation       Communication  Communication Communication: No difficulties (slow responses, but responds to all questions)   Cognition Arousal/Alertness: Awake/alert Behavior During Therapy: WFL for tasks assessed/performed Overall Cognitive Status: No family/caregiver present to determine baseline cognitive functioning                                 General Comments: Pt is oriented to self, generally to location (knows he is in "[place]", but thinks he is at ALF when asked the name of the place), unable to state the date; when asked he says "24"; when asked the month, he repeats "23 or 24". 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     General Comments       Exercises     Shoulder Instructions      Home Living Family/patient expects to be discharged to:: Assisted living                             Home Equipment: Rollator (4 wheels);Cane - single point;Shower seat;Grab bars - toilet;Grab bars - tub/shower;Hand held shower head (obtained per PT note)   Additional Comments: Difficult to obtain set up and prior level  information; pt not fully oriented. Pt is able to state that he lives in assisted living.      Prior Functioning/Environment Prior Level of Function : Patient poor historian/Family not available             Mobility Comments: Pt reports Ind amb limited facility distances without an AD, PRN rollator use ADLs Comments: Difficult to obtain 2/2 decreased cognition        OT Problem List: Decreased strength;Decreased activity tolerance;Impaired balance (sitting and/or standing);Decreased safety awareness;Decreased cognition      OT Treatment/Interventions: Self-care/ADL training;Therapeutic exercise;Therapeutic activities;Cognitive remediation/compensation;Patient/family education    OT Goals(Current goals can be found in the care plan section) Acute Rehab OT Goals Patient Stated Goal: Get better OT Goal  Formulation: With patient Time For Goal Achievement: 02/18/23 Potential to Achieve Goals: Fair  OT Frequency: Min 2X/week    Co-evaluation              AM-PAC OT "6 Clicks" Daily Activity     Outcome Measure Help from another person eating meals?: A Little Help from another person taking care of personal grooming?: A Lot Help from another person toileting, which includes using toliet, bedpan, or urinal?: Total Help from another person bathing (including washing, rinsing, drying)?: A Lot Help from another person to put on and taking off regular upper body clothing?: A Lot Help from another person to put on and taking off regular lower body clothing?: Total 6 Click Score: 11   End of Session Equipment Utilized During Treatment:  (bedpan) Nurse Communication: Other (comment) (NT aware that pt on bedpan and not safe to ambulate/transfer)  Activity Tolerance: Patient tolerated treatment well Patient left: in bed;with bed alarm set (NT aware of pt status; pt on bedpan for bowel movement)  OT Visit Diagnosis: Repeated falls (R29.6)                Time: [id number] OT Time Calculation (min): 29 min Charges:  OT General Charges $OT Visit: 1 Visit OT Evaluation $OT Eval Moderate Complexity: 1 Mod OT Treatments $Self Care/Home Management : 8-22 mins  Waymon Amato, MS, OTR/L   Vania Rea 02/04/2023, 12:25 PM

## 2023-02-04 NOTE — Assessment & Plan Note (Signed)
-  Hold risperidone, Zoloft due to QTc prolongation -Continue entacapone

## 2023-02-04 NOTE — Assessment & Plan Note (Signed)
Mild to be due to worsening [name]'s.  Currently appears to be at his baseline where he is oriented to self and place.  CT head negative for any acute abnormality.  UA negative for UTI. -Continue to monitor

## 2023-02-04 NOTE — Evaluation (Signed)
Physical Therapy Evaluation Patient Details Name: Corey Morrison MRN: [id number] DOB: 01-25-1956 Today's Date: 02/04/2023  History of Present Illness  Pt is a 67 y.o. male with medical history significant of hyperlipidemia, CAD, depression with anxiety, [name]'s disease, orthostatic status, peripheral neuropathy, GERD, A-fib not on anticoagulants, who presents with fall. MD assessment includes: Fall due to hypotension, acute metabolic encephalopathy, orthostatic hypotension, PD, leukocytosis, normocytic anemia, and prolonged QT interval.   Clinical Impression  Pt was pleasant and motivated to participate during the session and put forth good effort throughout. Pt required extensive physical assistance and cuing for sequencing with bed mobility tasks. Once in sitting pt presented fair static sitting balance and required min A to come to standing with heavy cuing for hand placement.  Pt was unable to advance either LE while in standing despite multiple attempts.  Pt eventually began to complain of feeling dizzy in standing and returned to sitting.  Unable to get a sitting BP secondary to pt unable to relax his arm.  BP taken in supine at 107/45 with HR 59.  Pt is at a very high risk for falls and will benefit from PT services in a SNF setting upon discharge to safely address deficits listed in patient problem list for decreased caregiver assistance and eventual return to PLOF.         Recommendations for follow up therapy are one component of a multi-disciplinary discharge planning process, led by the attending physician.  Recommendations may be updated based on patient status, additional functional criteria and insurance authorization.  Follow Up Recommendations Skilled nursing-short term rehab (<3 hours/day) Can patient physically be transported by private vehicle: No    Assistance Recommended at Discharge Frequent or constant Supervision/Assistance  Patient can return home with the following   Two people to help with walking and/or transfers;A lot of help with bathing/dressing/bathroom;Assistance with cooking/housework;Direct supervision/assist for medications management;Assist for transportation    Equipment Recommendations None recommended by PT  Recommendations for Other Services       Functional Status Assessment Patient has had a recent decline in their functional status and/or demonstrates limited ability to make significant improvements in function in a reasonable and predictable amount of time     Precautions / Restrictions Precautions Precautions: Fall Restrictions Weight Bearing Restrictions: No      Mobility  Bed Mobility Overal bed mobility: Needs Assistance Bed Mobility: Supine to Sit, Sit to Supine     Supine to sit: Max assist Sit to supine: Max assist   General bed mobility comments: Max multi-modal cues for sequencing with pt having significant difficulty following all commands; max A for BLE and trunk management    Transfers Overall transfer level: Needs assistance Equipment used: Rolling walker (2 wheels) Transfers: Sit to/from Stand Sit to Stand: Min assist, +2 safety/equipment           General transfer comment: Pt required extensive cuing for general sequencing with posterior instability upon coming to standing    Ambulation/Gait               General Gait Details: Pt unable to advance either LE while in standing  Stairs            Wheelchair Mobility    Modified Rankin (Stroke Patients Only)       Balance Overall balance assessment: Needs assistance, History of Falls   Sitting balance-Leahy Scale: Fair     Standing balance support: Bilateral upper extremity supported Standing balance-Leahy Scale: Poor  Pertinent Vitals/Pain Pain Assessment Pain Assessment: No/denies pain    Home Living Family/patient expects to be discharged to:: Assisted living                  Home Equipment: Rollator (4 wheels);Cane - single point;Shower seat;Grab bars - toilet;Grab bars - tub/shower;Hand held shower head      Prior Function               Mobility Comments: Pt reports Ind amb limited facility distances without an AD, PRN rollator use ADLs Comments: Ind with ADLs     Hand Dominance   Dominant Hand: Right    Extremity/Trunk Assessment   Upper Extremity Assessment Upper Extremity Assessment: Generalized weakness    Lower Extremity Assessment Lower Extremity Assessment: Generalized weakness       Communication   Communication: No difficulties  Cognition Arousal/Alertness: Awake/alert Behavior During Therapy: WFL for tasks assessed/performed Overall Cognitive Status: No family/caregiver present to determine baseline cognitive functioning                                          General Comments      Exercises Total Joint Exercises Ankle Circles/Pumps: AROM, Strengthening, Both, 10 reps, 5 reps Quad Sets: Strengthening, Both, 5 reps, 10 reps Hip ABduction/ADduction: AAROM, Strengthening, Both, 10 reps Straight Leg Raises: AAROM, Strengthening, Both, 10 reps Long Arc Quad: Strengthening, Both, 5 reps, 10 reps Knee Flexion: Strengthening, Both, 5 reps, 10 reps   Assessment/Plan    PT Assessment Patient needs continued PT services  PT Problem List Decreased strength;Decreased activity tolerance;Decreased balance;Decreased mobility;Decreased knowledge of use of DME       PT Treatment Interventions DME instruction;Gait training;Functional mobility training;Therapeutic activities;Therapeutic exercise;Balance training;Patient/family education    PT Goals (Current goals can be found in the Care Plan section)  Acute Rehab PT Goals Patient Stated Goal: To get home PT Goal Formulation: With patient Time For Goal Achievement: 02/17/23 Potential to Achieve Goals: Fair    Frequency Min 2X/week     Co-evaluation                AM-PAC PT "6 Clicks" Mobility  Outcome Measure Help needed turning from your back to your side while in a flat bed without using bedrails?: A Lot Help needed moving from lying on your back to sitting on the side of a flat bed without using bedrails?: A Lot Help needed moving to and from a bed to a chair (including a wheelchair)?: Total Help needed standing up from a chair using your arms (e.g., wheelchair or bedside chair)?: A Little Help needed to walk in hospital room?: Total Help needed climbing 3-5 steps with a railing? : Total 6 Click Score: 10    End of Session Equipment Utilized During Treatment: Gait belt Activity Tolerance: Patient tolerated treatment well Patient left: in bed;with call bell/phone within reach;with bed alarm set Nurse Communication: Mobility status PT Visit Diagnosis: Unsteadiness on feet (R26.81);History of falling (Z91.81);Difficulty in walking, not elsewhere classified (R26.2);Muscle weakness (generalized) (M62.81)    Time: [id number] PT Time Calculation (min) (ACUTE ONLY): 23 min   Charges:   PT Evaluation $PT Eval Moderate Complexity: 1 Mod PT Treatments $Therapeutic Exercise: 8-22 mins       D. Scott Rasheda Ledger PT, DPT 02/04/23, 11:21 AM

## 2023-02-04 NOTE — Assessment & Plan Note (Signed)
Some decrease of hemoglobin to 8.9.  No obvious bleeding.  All cell lines decreased so some dilutional effect. -Continue to monitor

## 2023-02-04 NOTE — NC FL2 (Signed)
[place] LEVEL OF CARE FORM     IDENTIFICATION  Patient Name: Corey Morrison Birthdate: [birth year] Sex: male Admission Date (Current Location): 02/03/2023  [place] and [place] Number:  Engineering geologist and Address:  [facility], [address], [place], [place] [address]      Provider Number: [id number]  Attending Physician Name and Address:  Lorella Nimrod, MD  Relative Name and Phone Number:  Ermelinda Das [phone]    Current Level of Care: Hospital Recommended Level of Care: [place] Prior Approval Number:    Date Approved/Denied:   PASRR Number: Pending  Discharge Plan: SNF    Current Diagnoses: Patient Active Problem List   Diagnosis Date Noted   Fall 02/03/2023   CAD (coronary artery disease) 02/03/2023   Depression with anxiety 02/03/2023   Hypotension 02/03/2023   Leukocytosis Q000111Q   Acute metabolic encephalopathy Q000111Q   Prolonged QT interval 02/03/2023   Hallucination 07/27/2022   Peripheral neuropathy 07/27/2022   [name] disease, symptomatic 07/23/2022   GERD without esophagitis 07/21/2022   Dyslipidemia 07/21/2022   Vitamin B12 deficiency 07/21/2022   Depression 07/21/2022   Weakness    Nocturnal cough 06/25/2022   Insomnia 06/25/2022   Orthostatic hypotension 06/23/2022   Paroxysmal atrial fibrillation ([place]) 06/23/2022   Normocytic anemia 06/23/2022   Ambulatory dysfunction 06/22/2022   [name]'s disease 06/11/2022    Orientation RESPIRATION BLADDER Height & Weight     Self, Place  Normal Incontinent Weight: 68 kg Height:  '5\' 9"'$  (175.3 cm)  BEHAVIORAL SYMPTOMS/MOOD NEUROLOGICAL BOWEL NUTRITION STATUS      Incontinent Diet  AMBULATORY STATUS COMMUNICATION OF NEEDS Skin   Extensive Assist Verbally Normal                       Personal Care Assistance Level of Assistance  Bathing, Feeding, Dressing Bathing Assistance: Maximum assistance Feeding assistance:  Limited assistance Dressing Assistance: Maximum assistance     Functional Limitations Info             SPECIAL CARE FACTORS FREQUENCY  PT (By licensed PT), OT (By licensed OT)     PT Frequency: 5 times a week OT Frequency: 5 times a week            Contractures Contractures Info: Not present    Additional Factors Info  Code Status, Allergies Code Status Info: DNR Allergies Info: NKA           Current Medications (02/04/2023):  This is the current hospital active medication list Current Facility-Administered Medications  Medication Dose Route Frequency Provider Last Rate Last Admin   0.9 %  sodium chloride infusion   Intravenous Continuous Ivor Costa, MD 100 mL/hr at 02/04/23 0304 New Bag at 02/04/23 0304   acetaminophen (TYLENOL) tablet 650 mg  650 mg Oral Q6H PRN Ivor Costa, MD       aspirin EC tablet 81 mg  81 mg Oral Daily Ivor Costa, MD       capsaicin (ZOSTRIX) Q000111Q % cream 1 Application  1 Application Topical BID Ivor Costa, MD   1 Application at [id number]   carbidopa-levodopa (SINEMET IR) 25-100 MG per tablet immediate release 1 tablet  1 tablet Oral TID Ivor Costa, MD   1 tablet at 02/03/23 2154   carbidopa-levodopa (SINEMET IR) 25-100 MG per tablet immediate release 2 tablet  2 tablet Oral TID Ivor Costa, MD   2 tablet at 02/04/23 [id number]   cholecalciferol (VITAMIN  D3) 25 MCG (1000 UNIT) tablet 2,000 Units  2,000 Units Oral Daily Ivor Costa, MD       cyanocobalamin (VITAMIN B12) tablet 1,000 mcg  1,000 mcg Oral Daily Ivor Costa, MD       diphenhydrAMINE (BENADRYL) injection 12.5 mg  12.5 mg Intravenous Q8H PRN Ivor Costa, MD       entacapone (COMTAN) tablet 200 mg  200 mg Oral 6 times per day Ivor Costa, MD   200 mg at 02/04/23 [id number]   ezetimibe (ZETIA) tablet 10 mg  10 mg Oral Daily Ivor Costa, MD       fluticasone (FLONASE) 50 MCG/ACT nasal spray 1 spray  1 spray Each Nare Daily PRN Ivor Costa, MD       gabapentin (NEURONTIN) capsule 200 mg  200 mg Oral TID  Ivor Costa, MD   200 mg at 02/03/23 2154   guaiFENesin (ROBITUSSIN) 100 MG/5ML liquid 300 mg  300 mg Oral Q6H PRN Ivor Costa, MD       loperamide (IMODIUM) capsule 4 mg  4 mg Oral PRN Ivor Costa, MD       magnesium hydroxide (MILK OF MAGNESIA) suspension 30 mL  30 mL Oral Daily PRN Ivor Costa, MD       melatonin tablet 5 mg  5 mg Oral QHS Ivor Costa, MD   5 mg at 02/03/23 2154   midodrine (PROAMATINE) tablet 5 mg  5 mg Oral TID WC Ivor Costa, MD       pantoprazole (PROTONIX) EC tablet 40 mg  40 mg Oral Daily Ivor Costa, MD       polyethylene glycol (MIRALAX / GLYCOLAX) packet 17 g  17 g Oral Daily PRN Ivor Costa, MD       psyllium (HYDROCIL/METAMUCIL) 1 packet  1 packet Oral Gordan Payment, MD   1 packet at 02/03/23 2155     Discharge Medications: Please see discharge summary for a list of discharge medications.  Relevant Imaging Results:  Relevant Lab Results:   Additional Information SSN [id number]  Valente David, RN

## 2023-02-04 NOTE — Assessment & Plan Note (Signed)
May be reactive with fall.  Improving.  No obvious infection.  Blood cultures remain negative in 24 hours. -Follow-up final culture results

## 2023-02-04 NOTE — Assessment & Plan Note (Signed)
Symptoms and history is more consistent with autonomic dysfunction and orthostatic vitals.  Most likely secondary to underlying advanced parkinsonism. PT/OT are recommending SNF. -Will continue home midodrine -Fall precautions

## 2023-02-04 NOTE — Progress Notes (Signed)
   02/04/23 1100  Spiritual Encounters  Type of Visit Follow up  Care provided to: Patient  Referral source Nurse (RN/NT/LPN)  Reason for visit Routine spiritual support  OnCall Visit Yes  Interventions  Spiritual Care Interventions Made Compassionate presence;Reflective listening   Chaplain returned to follow up on earlier visit to provide compassionate presence and conversation.

## 2023-02-04 NOTE — Assessment & Plan Note (Signed)
Heart rate well-controlled.  Not on any anticoagulation due to high risk of fall

## 2023-02-05 ENCOUNTER — Observation Stay: Payer: Medicare Other

## 2023-02-05 DIAGNOSIS — Z87891 Personal history of nicotine dependence: Secondary | ICD-10-CM | POA: Diagnosis not present

## 2023-02-05 DIAGNOSIS — F0284 Dementia in other diseases classified elsewhere, unspecified severity, with anxiety: Secondary | ICD-10-CM | POA: Diagnosis present

## 2023-02-05 DIAGNOSIS — I480 Paroxysmal atrial fibrillation: Secondary | ICD-10-CM | POA: Diagnosis present

## 2023-02-05 DIAGNOSIS — G20B1 Parkinson's disease with dyskinesia, without mention of fluctuations: Secondary | ICD-10-CM | POA: Diagnosis present

## 2023-02-05 DIAGNOSIS — R54 Age-related physical debility: Secondary | ICD-10-CM | POA: Diagnosis present

## 2023-02-05 DIAGNOSIS — Z7982 Long term (current) use of aspirin: Secondary | ICD-10-CM | POA: Diagnosis not present

## 2023-02-05 DIAGNOSIS — F0283 Dementia in other diseases classified elsewhere, unspecified severity, with mood disturbance: Secondary | ICD-10-CM | POA: Diagnosis present

## 2023-02-05 DIAGNOSIS — E785 Hyperlipidemia, unspecified: Secondary | ICD-10-CM | POA: Diagnosis present

## 2023-02-05 DIAGNOSIS — W19XXXA Unspecified fall, initial encounter: Secondary | ICD-10-CM | POA: Diagnosis not present

## 2023-02-05 DIAGNOSIS — R262 Difficulty in walking, not elsewhere classified: Secondary | ICD-10-CM | POA: Diagnosis not present

## 2023-02-05 DIAGNOSIS — Z955 Presence of coronary angioplasty implant and graft: Secondary | ICD-10-CM | POA: Diagnosis not present

## 2023-02-05 DIAGNOSIS — S0990XA Unspecified injury of head, initial encounter: Secondary | ICD-10-CM | POA: Diagnosis present

## 2023-02-05 DIAGNOSIS — D72829 Elevated white blood cell count, unspecified: Secondary | ICD-10-CM | POA: Diagnosis present

## 2023-02-05 DIAGNOSIS — G909 Disorder of the autonomic nervous system, unspecified: Secondary | ICD-10-CM | POA: Diagnosis present

## 2023-02-05 DIAGNOSIS — K219 Gastro-esophageal reflux disease without esophagitis: Secondary | ICD-10-CM | POA: Diagnosis present

## 2023-02-05 DIAGNOSIS — G20A1 Parkinson's disease without dyskinesia, without mention of fluctuations: Secondary | ICD-10-CM | POA: Diagnosis not present

## 2023-02-05 DIAGNOSIS — Z66 Do not resuscitate: Secondary | ICD-10-CM | POA: Diagnosis present

## 2023-02-05 DIAGNOSIS — G629 Polyneuropathy, unspecified: Secondary | ICD-10-CM | POA: Diagnosis present

## 2023-02-05 DIAGNOSIS — R443 Hallucinations, unspecified: Secondary | ICD-10-CM | POA: Diagnosis not present

## 2023-02-05 DIAGNOSIS — R030 Elevated blood-pressure reading, without diagnosis of hypertension: Secondary | ICD-10-CM | POA: Diagnosis present

## 2023-02-05 DIAGNOSIS — G9389 Other specified disorders of brain: Secondary | ICD-10-CM | POA: Diagnosis present

## 2023-02-05 DIAGNOSIS — Z79899 Other long term (current) drug therapy: Secondary | ICD-10-CM | POA: Diagnosis not present

## 2023-02-05 DIAGNOSIS — I951 Orthostatic hypotension: Secondary | ICD-10-CM | POA: Diagnosis present

## 2023-02-05 DIAGNOSIS — Z751 Person awaiting admission to adequate facility elsewhere: Secondary | ICD-10-CM | POA: Diagnosis not present

## 2023-02-05 DIAGNOSIS — G20B2 Parkinson's disease with dyskinesia, with fluctuations: Secondary | ICD-10-CM | POA: Diagnosis present

## 2023-02-05 DIAGNOSIS — R296 Repeated falls: Secondary | ICD-10-CM | POA: Diagnosis present

## 2023-02-05 DIAGNOSIS — I2510 Atherosclerotic heart disease of native coronary artery without angina pectoris: Secondary | ICD-10-CM | POA: Diagnosis present

## 2023-02-05 DIAGNOSIS — F0282 Dementia in other diseases classified elsewhere, unspecified severity, with psychotic disturbance: Secondary | ICD-10-CM | POA: Diagnosis present

## 2023-02-05 DIAGNOSIS — R4182 Altered mental status, unspecified: Secondary | ICD-10-CM | POA: Diagnosis not present

## 2023-02-05 DIAGNOSIS — D649 Anemia, unspecified: Secondary | ICD-10-CM | POA: Diagnosis present

## 2023-02-05 LAB — CBC
HCT: 29.7 % — ABNORMAL LOW (ref 39.0–52.0)
Hemoglobin: 9.3 g/dL — ABNORMAL LOW (ref 13.0–17.0)
MCH: 30.3 pg (ref 26.0–34.0)
MCHC: 31.3 g/dL (ref 30.0–36.0)
MCV: 96.7 fL (ref 80.0–100.0)
Platelets: 147 10*3/uL — ABNORMAL LOW (ref 150–400)
RBC: 3.07 MIL/uL — ABNORMAL LOW (ref 4.22–5.81)
RDW: 12.9 % (ref 11.5–15.5)
WBC: 10.3 10*3/uL (ref 4.0–10.5)
nRBC: 0 % (ref 0.0–0.2)

## 2023-02-05 MED ORDER — CAPSAICIN 0.025 % EX CREA
1.0000 | TOPICAL_CREAM | Freq: Two times a day (BID) | CUTANEOUS | Status: DC
  Administered 2023-02-06 – 2023-02-07 (×3): 1 via TOPICAL
  Filled 2023-02-05: qty 1

## 2023-02-05 MED ORDER — SERTRALINE HCL 50 MG PO TABS
100.0000 mg | ORAL_TABLET | Freq: Every day | ORAL | Status: DC
  Administered 2023-02-05 – 2023-02-07 (×3): 100 mg via ORAL
  Filled 2023-02-05 (×3): qty 2

## 2023-02-05 MED ORDER — RISPERIDONE 0.5 MG PO TABS
0.5000 mg | ORAL_TABLET | Freq: Two times a day (BID) | ORAL | Status: DC
  Administered 2023-02-05 – 2023-02-07 (×5): 0.5 mg via ORAL
  Filled 2023-02-05 (×5): qty 1

## 2023-02-05 MED ORDER — MIDAZOLAM HCL 2 MG/2ML IJ SOLN: 1.0000 mg | Freq: Once | INTRAMUSCULAR | Status: DC | PRN

## 2023-02-05 NOTE — Consult Note (Signed)
Neurology Consultation Reason for Consult: Lethargy Requesting Physician: Leanora Cover  CC: Fall  History is obtained from: Chart review primarily, patient is not a reliable historian  HPI: Corey Morrison is a 67 y.o. male with a past medical history significant for [name]'s disease on Sinemet, complicated by hallucinations on Risperdal, orthostatic hypotension on midodrine, chart documentation of atrial fibrillation not on anticoagulation due to frequent falls, hyperlipidemia, coronary artery disease s/p stents to proximal and mid LAD (05/2020), anxiety/depression  He has had multiple admissions for falls in the setting of his [name]'s disease, which is the reason for his admission again 3/8 (also presented similarly 3/6).  While in neurology notes he is documented as being fully oriented per multiple different ED provider discussions with nursing staff at his facility and with his POA he has a waxing waning mental status, is frequently only able to give vague details of recent events, and is not oriented to time but typically oriented to self and place.  In PT evaluation he was noted to have significant impairment in his ADLs with dysmetria/coordination deficits and hearing grooming and inability to safely ambulate  At the time of my evaluation he has significant flight of ideas reporting that he is concerned about the fact that he has an STD (HSV on further questioning), and then states I do not have an STD and notes that every time bell rings an Glen[place] Haring gets their wings.  He does not clearly seem to be hallucinating but acknowledges history of hallucinations which is documented in his chart.  He follows with Dr. Melrose Nakayama on an outpatient basis, last seen 12/08/2022 although a full note is not available from that visit, an after visit summary and some follow-up telephone notes are available.  There was an attempt to increase his risperidone from 0.5 mg 3 times daily to 0.75 mg 3 times daily as well  as increasing his Sinemet from a total of 9 tabs daily to a total of 13 tablets daily.  However he presented for falls again to the ED on 1/14 which was attributed to medication changes and he was changed back to prior doses of medication on 1/16.  The patient does recall that his medication was increased and then put back to the prior doses on my interview today, but states he thinks this happened a week or 2 ago  Current Sinemet dosing 2 tabs at 7 AM, 1 tab at 10 AM, 2 tabs at noon, and 1 tab at 2 PM, 2 tabs at 5 PM, 1 tab at 9 PM Last attempt to increase  3 tabs at 7 AM, 2 tabs for all other doses  He is additionally on entacapone 200 mg 6 times daily  Midodrine in the past has been documented as 7.5 mg at 9, noon and 4 PM, but he is currently ordered for 5 mg  Risperdal has been held this admission due to concern for prolonged Qtc  On review of the chart he has been trialed on Seroquel before but it is not clear why this medication was discontinued.  He has additionally been on Requip in the past but this was stopped due to worsening hallucinations  Additionally had a prior ED visit there was some concern for possible seizure activity as described by the ED provider: "12:27 PM on 12/11/2022 After being moved from the bed to the wheelchair for discharge, the patient had an episode of disorientation where he stopped speaking, was looking around, and making grasping movements.  However, these  were purposeful movements grabbing the chair in his face, he did not have any convulsions or seizure-like activity.  After a few minutes in the chair this resolved and the patient began speaking again and went back to his baseline.  The POA states that this has happened many times previously although is not a daily occurrence.  He states that this often happens when the patient moves from lying down to sitting or standing.  We checked the blood pressure and it was normal.  The patient has already had a seizure  workup and has no evidence of seizures. I recommended observing the patient for another 10 to 15 minutes at least.  After a few more minutes the patient returned to his baseline and the POA stated he would like to proceed with discharge.  He again confirms that these episodes have happened multiple times before.  I suspect that this is what happened earlier that led to the patient being taken to the ED."  Patient reports to me he has had prior EEG, but I am unable to see this documented anywhere in the chart  ROS: Unable to obtain due to altered mental status.   Past Medical History:  Diagnosis Date   A-fib ([place])    Anxiety disorder    Coronary artery disease    Hyperlipidemia    [name] disease    Vitamin B12 deficiency    Past Surgical History:  Procedure Laterality Date   CARDIAC CATHETERIZATION     Current Outpatient Medications  Medication Instructions   acetaminophen (TYLENOL) 650 mg, Oral, Every 6 hours, 0800/1400/2000   aspirin EC 81 mg, Oral, Daily   BIOFREEZE 4 % GEL SMARTSIG:sparingly Topical 3 Times Daily   capsicum (ZOSTRIX) 0.075 % topical cream 1 Application, Topical, 2 times daily, (Apply to feet and thighs)   carbidopa-levodopa (SINEMET IR) 25-100 MG tablet 1 tablet, Oral, 6 times daily   carbidopa-levodopa (SINEMET IR) 25-100 MG tablet 2 tablets, Oral, 3 times daily, 0700/1200/1700   carbidopa-levodopa (SINEMET IR) 25-250 MG tablet 1 tablet, 3 times daily   cyanocobalamin (VITAMIN B12) 1,000 mcg, Oral, Daily   entacapone (COMTAN) 200 mg, Oral, 6 times daily   esomeprazole (NEXIUM) 20 mg, Daily   ezetimibe (ZETIA) 10 mg, Oral, Daily   fluticasone (FLONASE) 50 MCG/ACT nasal spray 1 spray, Each Nare, Daily   gabapentin (NEURONTIN) 200 mg, Oral, 3 times daily   guaifenesin (ROBITUSSIN) 300 mg, Oral, Every 6 hours PRN   loperamide (IMODIUM A-D) 4 mg, Oral, As needed   magnesium hydroxide (MILK OF MAGNESIA) 400 MG/5ML suspension 30 mLs, Oral, Daily PRN   melatonin 5  mg, Oral, Daily at bedtime   midodrine (PROAMATINE) 7.5 mg, Oral, 3 times daily with meals, 0900/1200/1600   omeprazole (PRILOSEC) 20 mg, Oral, Daily   polyethylene glycol (MIRALAX / GLYCOLAX) 17 g, Oral, Daily PRN   psyllium (REGULOID) 0.52 g, Oral, Daily at bedtime   risperiDONE (RISPERDAL) 0.5 MG tablet TAKE 1 TABLET BY MOUTH 2 TIMES PER DAY   sertraline (ZOLOFT) 100 mg, Oral, Daily   Vitamin D 2,000 Units, Oral, Daily     Family History  Problem Relation Age of Onset   Heart Problems Father     Social History:  reports that he has quit smoking. He has never used smokeless tobacco. He reports that he does not drink alcohol and does not use drugs.   Exam: Current vital signs: BP 119/80 (BP Location: Left Arm)   Pulse 63   Temp 98.4  F (36.9 C)   Resp 16   Ht '5\' 9"'$  (1.753 m)   Wt 68 kg   SpO2 97%   BMI 22.15 kg/m  Vital signs in last 24 hours: Temp:  [97.4 F (36.3 C)-98.5 F (36.9 C)] 98.4 F (36.9 C) (03/10 0734) Pulse Rate:  [63-68] 63 (03/10 0734) Resp:  [16-18] 16 (03/10 0734) BP: (112-153)/(59-81) 119/80 (03/10 0734) SpO2:  [97 %-100 %] 97 % (03/10 0734)   Physical Exam  Constitutional: Appears thin but well-developed Psych: Affect calm and cooperative, no acute anxiety at the time of my evaluation but very poor attention/concentration and tangential speech/flight of ideas Eyes: No scleral injection HENT: No oropharyngeal obstruction.  Left forehead hematoma MSK: no major joint deformities.  Cardiovascular: Perfusing extremities well Respiratory: Effort normal, non-labored breathing GI: Soft.  No distension. There is no tenderness.  Skin: Scattered bruises from prior falls  Neuro: Mental Status: Patient is awake, alert, oriented to person, [place] but reports he is at [facility] (his facility), oriented to age but not month or year and unable to provide significant details of his situation Naming and repetition intact within limits of  concentration/attention.  Possibly some mild intermittent neglect of the left side but does not extinguish clearly to double sided visual or tactile stimuli Cranial Nerves: II: Does not orient as readily to stimuli on the left side particularly the left upper quadrant.  At times does not report number of fingers properly in either upper quadrant, more secondary to attention then appearing to be a true visual impairment. Pupils are equal, round, and reactive to light.   III,IV, VI: Right gaze preference initially, unclear if secondary to hallucinations/attention versus mild neglect.  Later in our conversation he does not have significant gaze preference V: Facial sensation is symmetric to light touch VII: Facial movement is notable for slight nasolabial fold flattening on the right at baseline, symmetric activation.  VIII: hearing is intact to voice X: Uvula elevates symmetrically XI: Shoulder shrug is symmetric. XII: tongue is midline without atrophy or fasciculations.  Motor: He does have cogwheeling tone notable in the left greater than the right upper extremity as well as some mild to moderate dyskinesias.  Unclear if rightward head turn/right gaze preference seen in the initial part of my evaluation is secondary to dyskinesia or possibly focal seizure versus secondary to attending to hallucination on that side.  He does not report seeing anything on that side to me, but is tangential and answers to questions.  5/5 strength was present in all four extremities, other than mild hip flexion bilaterally appropriate for age/debility Sensory: Sensation is symmetric to light touch and temperature in the arms and legs, without extinction to double sided stimuli Deep Tendon Reflexes: 3+ and symmetric in the brachioradialis and 2+ and symmetric patellae.  Cerebellar: No clear ataxia on finger-nose testing but does not actually touch examiner's finger with either side. Gait:  Deferred given frequent  falls   I have reviewed labs in epic and the results pertinent to this consultation are:  Basic Metabolic Panel: Recent Labs  Lab 02/01/23 0858 02/03/23 1202 02/04/23 0446  NA 141 138 141  K 4.7 4.3 4.1  CL 108 105 109  CO2 '25 25 27  '$ GLUCOSE 104* 102* 94  BUN 31* 36* 24*  CREATININE 1.00 1.10 0.83  CALCIUM 8.9 8.8* 8.5*    CBC: Recent Labs  Lab 02/01/23 0858 02/03/23 1202 02/04/23 0446 02/05/23 0618  WBC 7.2 12.3* 11.7* 10.3  NEUTROABS 6.0  --   --   --  HGB 10.3* 10.1* 8.9* 9.3*  HCT 31.9* 31.6* 27.7* 29.7*  MCV 96.1 96.0 95.5 96.7  PLT 151 149* 136* 147*    Coagulation Studies: No results for input(s): "LABPROT", "INR" in the last 72 hours.    EKG sinus rhythm with PVCs, QTc does not look clearly prolonged on my read but perhaps artifactually elevated secondary to PVCs  I have reviewed the images obtained:  3/6 initial head CT and CT C-spine negative for acute intracranial process 3/8 repeat head CT motion limited but negative for acute intracranial process  Assessment: Patient presenting with encephalopathy of unclear etiology at this time.  Differential includes [name]'s disease dementia related fluctuations, hypertension, stroke(s) too small to be seen on head CT, partial seizures.  At the time of my evaluation he does not have any significant lethargy but does have some right gaze preference/right head turn which may be secondary to dyskinesias versus hallucinations versus focal seizure.  Less likely secondary to stroke as it does seem to resolve during my evaluation, however he does remain at risk for strokes in the setting of his documented atrial fibrillation not on anticoagulation  Recommendations: -MRI brain without contrast, 1 mg of Versed ordered for sedation as needed prior to scan given his motion limited head CT 2 days ago -Routine EEG -Continue current Sinemet dosing as ordered as well as entacapone -Agree with holding risperidone at this time  as any hallucinations do not seem to be significantly causing any distress and this would be expected to worsen his orthostatic hypotension and [name]'s symptoms -Repeat EKG to confirm whether or not QTc is truly prolonged, may consider reintroducing risperidone if QTc is reassuring if hallucinations become more distressing -Other options for hallucination management in patients with [name]'s disease include Clozaril (which does carry significant long-term risk of blood dyscrasias), and pimavanserin (cost prohibitive with pharmacy reporting monthly cost of [phone] for this patient) -Neurology will follow along  Lesleigh Noe MD-PhD [facility] [phone] [facility] coverage for [facility] is from 8 AM to 4 AM in-house and 4 PM to 8 PM by telephone/video. 8 PM to 8 AM emergent questions or overnight urgent questions should be addressed to Teleneurology On-call or [facility] neurohospitalist; contact information can be found on AMION

## 2023-02-05 NOTE — Assessment & Plan Note (Signed)
Repeat EKG today with improvement of QTc. -Continue to monitor -Restarting home Zoloft and risperidone

## 2023-02-05 NOTE — Assessment & Plan Note (Signed)
Secondary to advanced [name]'s. PT is recommending SNF

## 2023-02-05 NOTE — Progress Notes (Signed)
Progress Note   Patient: Corey Morrison [id number] DOB: March 16, 1956 DOA: 02/03/2023     0 DOS: the patient was seen and examined on 02/05/2023   Brief hospital course: Taken from H&P. The patient is coming from assisted living facility    Ajit Yohey is a 67 y.o. male with medical history significant of hyperlipidemia, CAD, depression with anxiety, [name]'s disease, orthostatic status, peripheral neuropathy, GERD, A-fib not on anticoagulants, who presents with fall. Per report,  staff in facility found pt leaning against his bed today. They attempted to stand him up and then he fell forward hitting his head. He developed a small hematoma in the left forehead.  Patient seem to be more confused than baseline.  At baseline, patient is oriented to person and place, usually not to time.  Patient with history of multiple falls and ED visits for that reason.  Per report, patient had hypotension with systolic blood pressure in 70s, which improved to 126/77 after giving 1 L normal saline bolus in ED.   Data reviewed independently and ED Course: pt was found to have WBC 12.3, negative urinalysis, GFR> 60, troponin level 7, lactic acid 1.6, temperature normal, heart rate 57, 71, RR 27, oxygen saturation 100% on room air.  CT of head is motion limited, but is negative for acute issues.  Chest x-ray negative.  EKG: Sinus rhythm, QTc 524, left bundle blockade, poor R wave progression, LAD.   3/9: Vitals with mildly elevated blood pressure at 143/70.  Some improvement of leukocytosis to 11.7, hemoglobin decreased to 8.9 with no obvious bleeding, all cell lines decreased so some dilutional effect.  Patient with history of multiple falls, most likely autonomic dysfunction with his [name]'s disease.  Preliminary blood cultures negative.  Very difficult to obtain orthostatic vitals as patient was becoming very symptomatic and increased risk of fall. PT and OT are recommending SNF.  TOC is looking for  placement  3/10: Vital stable.  Leukocytosis resolved.  Hemoglobin at 9.3 which is around his baseline.  Preliminary blood cultures remain negative.  Nursing concern of worsening lethargy and dyskinesias which can be due to [name]'s.  Neurology was also consulted.  They are recommending MRI, EEG as he will be at high risk for stroke due to A-fib and not being on any anticoagulation.  Repeat EKG with improved QTc , restarting risperidone for hallucinations. Awaiting SNF placement   Assessment and Plan: * Fall Symptoms and history is more consistent with autonomic dysfunction and orthostatic vitals.  Most likely secondary to underlying advanced parkinsonism. PT/OT are recommending SNF. -Will continue home midodrine -Fall precautions  Orthostatic hypotension Unable to obtain proper orthostatic vitals as patient was becoming very symptomatic and unable to stand. Most likely autonomic dysfunction with advanced [name]'s. -Continue home midodrine  Ambulatory dysfunction Secondary to advanced [name]'s. PT is recommending SNF  Acute metabolic encephalopathy Might be due to worsening [name]'s.  Currently appears to be at his baseline where he is oriented to self and place.  CT head negative for any acute abnormality.  UA negative for UTI.  Nephrology was also consulted and they are recommending doing MRI and EEG to rule out any underlying stroke as he will remain high risk with A-fib and not being on any anticoagulation. -Continue to monitor  [name]'s disease -Continue Sinemet  Paroxysmal atrial fibrillation (HCC) Heart rate well-controlled.  Not on any anticoagulation due to high risk of fall  CAD (coronary artery disease) -Continue aspirin  Leukocytosis May be reactive with fall.  Improving.  No obvious infection.  Blood cultures remain negative in 24 hours. -Follow-up final culture results  Depression with anxiety Repeat EKG today with normalization of  QTc. -Restarting home risperidone and Zoloft -Continue entacapone -Monitor QTc  Normocytic anemia Some decrease of hemoglobin to 8.9.  No obvious bleeding.  All cell lines decreased so some dilutional effect. -Continue to monitor  Prolonged QT interval Repeat EKG today with improvement of QTc. -Continue to monitor -Restarting home Zoloft and risperidone   Subjective: Patient was awake and alert when seen today.  Denies any complaints.  Physical Exam: Vitals:   02/04/23 1551 02/04/23 1912 02/05/23 0412 02/05/23 0734  BP: (!) 112/59 114/64 (!) 153/81 119/80  Pulse: 64 68 66 63  Resp: '18 16 18 16  '$ Temp: (!) 97.4 F (36.3 C) 98.5 F (36.9 C) 98.1 F (36.7 C) 98.4 F (36.9 C)  TempSrc: Oral Axillary Oral   SpO2: 99% 98% 100% 97%  Weight:      Height:       General.  Frail gentleman, in no acute distress. Pulmonary.  Lungs clear bilaterally, normal respiratory effort. CV.  Regular rate and rhythm, no JVD, rub or murmur. Abdomen.  Soft, nontender, nondistended, BS positive. CNS.  Alert and oriented x 2.  No focal neurologic deficit. Extremities.  No edema, no cyanosis, pulses intact and symmetrical. Psychiatry.  Appears to have cognitive impairment  Data Reviewed: Prior data reviewed.  Family Communication: Discussed with [place]  Disposition: Status is: Observation The patient remains OBS appropriate and will d/c before 2 midnights.  Planned Discharge Destination: Skilled nursing facility  Time spent: 42 minutes  This record has been created using Systems analyst. Errors have been sought and corrected,but may not always be located. Such creation errors do not reflect on the standard of care.   Author: Lorella Nimrod, MD 02/05/2023 1:42 PM  For on call review [email or web address].

## 2023-02-05 NOTE — Assessment & Plan Note (Signed)
Might be due to worsening [name]'s.  Currently appears to be at his baseline where he is oriented to self and place.  CT head negative for any acute abnormality.  UA negative for UTI.  Nephrology was also consulted and they are recommending doing MRI and EEG to rule out any underlying stroke as he will remain high risk with A-fib and not being on any anticoagulation. -Continue to monitor

## 2023-02-05 NOTE — Assessment & Plan Note (Signed)
Repeat EKG today with normalization of QTc. -Restarting home risperidone and Zoloft -Continue entacapone -Monitor QTc

## 2023-02-06 ENCOUNTER — Inpatient Hospital Stay: Payer: Medicare Other

## 2023-02-06 DIAGNOSIS — G20A1 Parkinson's disease without dyskinesia, without mention of fluctuations: Secondary | ICD-10-CM

## 2023-02-06 DIAGNOSIS — R262 Difficulty in walking, not elsewhere classified: Secondary | ICD-10-CM | POA: Diagnosis not present

## 2023-02-06 DIAGNOSIS — R4182 Altered mental status, unspecified: Secondary | ICD-10-CM

## 2023-02-06 DIAGNOSIS — R443 Hallucinations, unspecified: Secondary | ICD-10-CM

## 2023-02-06 NOTE — Plan of Care (Signed)
  Problem: Nutrition: Goal: Adequate nutrition will be maintained 02/06/2023 1953 by Ruthy Dick D, RN Outcome: Progressing 02/06/2023 1911 by Ruthy Dick D, RN Outcome: Progressing   Problem: Safety: Goal: Ability to remain free from injury will improve 02/06/2023 1953 by Fuller Mandril, RN Outcome: Progressing 02/06/2023 1911 by Ruthy Dick D, RN Outcome: Progressing   Problem: Skin Integrity: Goal: Risk for impaired skin integrity will decrease 02/06/2023 1953 by Fuller Mandril, RN Outcome: Progressing 02/06/2023 1911 by Fuller Mandril, RN Outcome: Progressing

## 2023-02-06 NOTE — TOC Progression Note (Signed)
Transition of Care [facility]) - Progression Note    Patient Details  Name: Corey Morrison MRN: [id number] Date of Birth: Jul 25, 1956  Transition of [facility]) CM/SW Contact  Beverly Sessions, RN Phone Number: 02/06/2023, 2:17 PM  Clinical Narrative:     Rosalie Gums pending [place] unable to offer a bed Zara Council accepts bed at [facility] in [place] and notified [place] at [facility] started in [place] portal   Expected Discharge Plan: [place] Barriers to Discharge: Continued Medical Work up  Expected Discharge Plan and [place] Choice: [place] arrangements for the past 2 months: [place]                   DME Agency: NA                   Social Determinants of Health (SDOH) Interventions SDOH Screenings   Food Insecurity: No Food Insecurity (02/04/2023)  Housing: Low Risk  (02/04/2023)  Transportation Needs: No Transportation Needs (02/04/2023)  Utilities: Not At Risk (02/04/2023)  Tobacco Use: Medium Risk (02/04/2023)    Readmission Risk Interventions    07/23/2022    4:25 PM  Readmission Risk Prevention Plan  Transportation Screening Complete  Medication Review (RN Care Manager) Complete  PCP or Specialist appointment within 3-5 days of discharge Complete  HRI or [place] Complete  SW Recovery Care/Counseling Consult Complete  Palliative Care Screening Complete  [place] Complete

## 2023-02-06 NOTE — TOC Progression Note (Signed)
Transition of Care [facility]) - Progression Note    Patient Details  Name: Corey Morrison MRN: [id number] Date of Birth: September 27, 1956  Transition of Care [facility]) CM/SW Contact  Beverly Sessions, RN Phone Number: 02/06/2023, 12:10 PM  Clinical Narrative:     Clinical uploaded to [facility] Bed offer of [facility] presented to POA rick.  He would like to wait to hear back from [place] a decision.  Messages sent to [place] at [facility] to request review   Expected Discharge Plan: [place] Barriers to Discharge: Continued Medical Work up  Expected Discharge Plan and [place] Choice: [place] arrangements for the past 2 months: [place]                   DME Agency: NA                   Social Determinants of Health (SDOH) Interventions SDOH Screenings   Food Insecurity: No Food Insecurity (02/04/2023)  Housing: Low Risk  (02/04/2023)  Transportation Needs: No Transportation Needs (02/04/2023)  Utilities: Not At Risk (02/04/2023)  Tobacco Use: Medium Risk (02/04/2023)    Readmission Risk Interventions    07/23/2022    4:25 PM  Readmission Risk Prevention Plan  Transportation Screening Complete  Medication Review (RN Care Manager) Complete  PCP or Specialist appointment within 3-5 days of discharge Complete  HRI or [place] Complete  SW Recovery Care/Counseling Consult Complete  Palliative Care Screening Complete  Skilled Nursing Facility Complete

## 2023-02-06 NOTE — Plan of Care (Signed)
  Problem: Nutrition: Goal: Adequate nutrition will be maintained Outcome: Progressing   Problem: Safety: Goal: Ability to remain free from injury will improve Outcome: Progressing   Problem: Skin Integrity: Goal: Risk for impaired skin integrity will decrease Outcome: Progressing   

## 2023-02-06 NOTE — TOC Progression Note (Signed)
Transition of Care [facility]) - Progression Note    Patient Details  Name: Corey Morrison MRN: [id number] Date of Birth: 01-Dec-1955  Transition of Care [facility]) CM/SW Contact  Beverly Sessions, RN Phone Number: 02/06/2023, 2:22 PM  Clinical Narrative:    Josem Kaufmann approved for [facility] - 02/07/2023-02/09/2023   Expected Discharge Plan: Skilled Nursing Facility Barriers to Discharge: Continued Medical Work up  Expected Discharge Plan and Services     Post Acute Care Choice: [place] arrangements for the past 2 months: [place]                   DME Agency: NA                   Social Determinants of Health (SDOH) Interventions SDOH Screenings   Food Insecurity: No Food Insecurity (02/04/2023)  Housing: Low Risk  (02/04/2023)  Transportation Needs: No Transportation Needs (02/04/2023)  Utilities: Not At Risk (02/04/2023)  Tobacco Use: Medium Risk (02/04/2023)    Readmission Risk Interventions    07/23/2022    4:25 PM  Readmission Risk Prevention Plan  Transportation Screening Complete  Medication Review (RN Care Manager) Complete  PCP or Specialist appointment within 3-5 days of discharge Complete  HRI or [place] Complete  SW Recovery Care/Counseling Consult Complete  Palliative Care Screening Complete  Skilled Nursing Facility Complete

## 2023-02-06 NOTE — Progress Notes (Signed)
Neurology progress note  S: Per patient and HCPOA at bedside, mental status changes have resolved and he is back to baseline.  O:  Vitals:   02/06/23 0750 02/06/23 1627  BP: (!) 118/56 117/61  Pulse: 69 75  Resp: 16 17  Temp: 97.8 F (36.6 C) 98 F (36.7 C)  SpO2: 100% 100%   Physical Exam  Constitutional: Appears thin but well-developed Psych: Affect calm and cooperative, no acute anxiety at the time of my evaluation but very poor attention/concentration and tangential speech/flight of ideas Eyes: No scleral injection HENT: No oropharyngeal obstruction.  Left forehead hematoma MSK: no major joint deformities.  Cardiovascular: Perfusing extremities well Respiratory: Effort normal, non-labored breathing GI: Soft.  No distension. There is no tenderness.  Skin: Scattered bruises from prior falls   Neuro: Mental Status: Patient is awake, alert, oriented to person and [place] Cranial Nerves: II: Does not orient as readily to stimuli on the left side particularly the left upper quadrant.  At times does not report number of fingers properly in either upper quadrant, more secondary to attention then appearing to be a true visual impairment. Pupils are equal, round, and reactive to light.   III,IV, VI: EOMI V: Facial sensation is symmetric to light touch VII: Facial movement is notable for slight nasolabial fold flattening on the right at baseline, symmetric activation.  VIII: hearing is intact to voice X: Uvula elevates symmetrically XI: Shoulder shrug is symmetric. XII: tongue is midline without atrophy or fasciculations.  Motor: He does have cogwheeling tone notable in the left greater than the right upper extremity as well as some mild to moderate dyskinesias.  5/5 strength was present in all four extremities, other than mild hip flexion bilaterally appropriate for age/debility Sensory: Sensation is symmetric to light touch and temperature in the arms and legs, without  extinction to double sided stimuli Deep Tendon Reflexes: 3+ and symmetric in the brachioradialis and 2+ and symmetric patellae.  Cerebellar: No clear ataxia on finger-nose testing but does not actually touch examiner's finger with either side. Gait:  Deferred given frequent falls  Data:  3/6 initial head CT and CT C-spine negative for acute intracranial process 3/8 repeat head CT motion limited but negative for acute intracranial process  MRI brain wo - no acute abnl  CNS imaging personally reviewed; I agree with above interpretation  EEG - moderate diffuse slowing 5 Hz, no epileptiform abnl  A/P: Patient presented with altered mental status beyond baseline, now resolved. Etiology felt to be fluctuations commonly seen in [name]'s disease with dementia. EEG did not show any epileptiform abnl, his brief episode of R gaze preference / R head turn yesterday is favored to be responsive to hallucinations at the time (given no other e/o dyskinesia and no epileptiform abnl on EEG).   - Continue current sinemet and entacapone dosing - Agree with holding risperidone at this time as any hallucinations do not seem to be significantly causing any distress and this would be expected to worsen his orthostatic hypotension and [name]'s symptoms  - Pt is medically stable and awaiting SNF placement - No further inpatient neurologic workup indicated at this time - Pt may f/u with his established outpatient neurologist  Neurology to be available for questions prn going forward.  Su Monks, MD [facility] [phone]  If 7pm- 7am, please page neurology on call as listed in [place].

## 2023-02-06 NOTE — TOC PASRR Note (Signed)
RE:  Corey Morrison Date of Birth: 11/10/1956 Date: 02/06/23      To Whom It May Concern:   Please be advised that the above-named patient will require a short-term nursing home stay - anticipated 30 days or less for rehabilitation and strengthening.  The plan is for return home

## 2023-02-06 NOTE — Progress Notes (Signed)
Eeg done 

## 2023-02-06 NOTE — Progress Notes (Signed)
Physical Therapy Treatment Patient Details Name: Corey Morrison MRN: [id number] DOB: 1956/01/30 Today's Date: 02/06/2023   History of Present Illness Pt is a 67 y.o. male with medical history significant of hyperlipidemia, CAD, depression with anxiety, [name]'s disease, orthostatic status, peripheral neuropathy, GERD, A-fib not on anticoagulants, who presents with fall. MD assessment includes: Fall due to hypotension, acute metabolic encephalopathy, orthostatic hypotension, PD, leukocytosis, normocytic anemia, and prolonged QT interval.    PT Comments    Pt was pleasant and motivated to participate during the session and put forth good effort throughout. Pt continued to require extensive multi-modal cuing for sequencing during the session most notably when attempting to go from sup to sit.  Pt able to perform below therex with only min to mod cuing compared to max multi-modal cuing with bed mobility tasks.  Pt able to stand and amb from bed to chair with +2 assist for safety and min A for stability with pt taking very short, choppy, shuffling steps and requiring assist for stability.  Upon sitting to recliner pt reported feeling dizzy similar to prior session with BP taken in sitting at 126/69 with HR 70 bpm.  Pt remains at a very high risk for falls and would not be safe to return to his prior living situation at this time.  Pt will benefit from PT services in a SNF setting upon discharge to safely address deficits listed in patient problem list for decreased caregiver assistance and eventual return to PLOF.      Recommendations for follow up therapy are one component of a multi-disciplinary discharge planning process, led by the attending physician.  Recommendations may be updated based on patient status, additional functional criteria and insurance authorization.  Follow Up Recommendations  Skilled nursing-short term rehab (<3 hours/day) Can patient physically be transported by private vehicle:  No   Assistance Recommended at Discharge Frequent or constant Supervision/Assistance  Patient can return home with the following Two people to help with walking and/or transfers;A lot of help with bathing/dressing/bathroom;Assistance with cooking/housework;Direct supervision/assist for medications management;Assist for transportation   Equipment Recommendations  None recommended by PT    Recommendations for Other Services       Precautions / Restrictions Precautions Precautions: Fall Restrictions Weight Bearing Restrictions: No Other Position/Activity Restrictions: Orthostatic     Mobility  Bed Mobility Overal bed mobility: Needs Assistance       Supine to sit: Mod assist     General bed mobility comments: Max multi-modal cues for sequencing with pt having significant difficulty following commands during sup to sit; mod A for BLE and trunk management    Transfers Overall transfer level: Needs assistance Equipment used: Rolling walker (2 wheels) Transfers: Sit to/from Stand Sit to Stand: Min assist, +2 safety/equipment, From elevated surface           General transfer comment: Mod multi-modal cues for hand placment    Ambulation/Gait Ambulation/Gait assistance: +2 safety/equipment, Min assist Gait Distance (Feet): 4 Feet Assistive device: Rolling walker (2 wheels) Gait Pattern/deviations: Step-through pattern, Decreased step length - right, Decreased step length - left, Shuffle Gait velocity: decreased     General Gait Details: Pt able to amb near the EOB and from bed to chair with very short, choppy, shuffling steps with +2 min A for stability and max multi-modal cuing for sequencing   Stairs             Wheelchair Mobility    Modified Rankin (Stroke Patients Only)  Balance Overall balance assessment: Needs assistance, History of Falls   Sitting balance-Leahy Scale: Fair     Standing balance support: During functional activity,  Bilateral upper extremity supported, Reliant on assistive device for balance Standing balance-Leahy Scale: Poor                              Cognition Arousal/Alertness: Awake/alert Behavior During Therapy: WFL for tasks assessed/performed Overall Cognitive Status: No family/caregiver present to determine baseline cognitive functioning                                          Exercises Total Joint Exercises Ankle Circles/Pumps: AROM, Strengthening, Both, 5 reps, 10 reps Hip ABduction/ADduction: AAROM, Strengthening, Both, 10 reps Straight Leg Raises: AAROM, Strengthening, Both, 10 reps Long Arc Quad: Strengthening, Both, 5 reps, 10 reps Knee Flexion: Strengthening, Both, 5 reps, 10 reps    General Comments        Pertinent Vitals/Pain Pain Assessment Pain Assessment: 0-10 Pain Score: 5  Pain Location: RLE Pain Descriptors / Indicators: Sore Pain Intervention(s): Repositioned, Monitored during session    Home Living                          Prior Function            PT Goals (current goals can now be found in the care plan section) Progress towards PT goals: Progressing toward goals    Frequency    Min 2X/week      PT Plan Current plan remains appropriate    Co-evaluation              AM-PAC PT "6 Clicks" Mobility   Outcome Measure  Help needed turning from your back to your side while in a flat bed without using bedrails?: A Lot Help needed moving from lying on your back to sitting on the side of a flat bed without using bedrails?: A Lot Help needed moving to and from a bed to a chair (including a wheelchair)?: A Lot Help needed standing up from a chair using your arms (e.g., wheelchair or bedside chair)?: A Little Help needed to walk in hospital room?: Total Help needed climbing 3-5 steps with a railing? : Total 6 Click Score: 11    End of Session Equipment Utilized During Treatment: Gait belt Activity  Tolerance: Patient tolerated treatment well Patient left: in chair;with call bell/phone within reach;with chair alarm set;with SCD's reapplied Nurse Communication: Mobility status;Other (comment) (+2 assist recommended for transfers bed to/from chair) PT Visit Diagnosis: Unsteadiness on feet (R26.81);History of falling (Z91.81);Difficulty in walking, not elsewhere classified (R26.2);Muscle weakness (generalized) (M62.81)     Time: [id number] PT Time Calculation (min) (ACUTE ONLY): 25 min  Charges:  $Therapeutic Exercise: 8-22 mins $Therapeutic Activity: 8-22 mins                     D. Scott Lynessa Almanzar PT, DPT 02/06/23, 11:46 AM

## 2023-02-06 NOTE — Progress Notes (Signed)
Progress Note   Patient: Corey Morrison [id number] DOB: January 31, 1956 DOA: 02/03/2023     1 DOS: the patient was seen and examined on 02/06/2023   Brief hospital course: Taken from H&P. The patient is coming from assisted living facility    Corey Morrison is a 67 y.o. male with medical history significant of hyperlipidemia, CAD, depression with anxiety, [name]'s disease, orthostatic status, peripheral neuropathy, GERD, A-fib not on anticoagulants, who presents with fall. Per report,  staff in facility found pt leaning against his bed today. They attempted to stand him up and then he fell forward hitting his head. He developed a small hematoma in the left forehead.  Patient seem to be more confused than baseline.  At baseline, patient is oriented to person and place, usually not to time.  Patient with history of multiple falls and ED visits for that reason.  Per report, patient had hypotension with systolic blood pressure in 70s, which improved to 126/77 after giving 1 L normal saline bolus in ED.   Data reviewed independently and ED Course: pt was found to have WBC 12.3, negative urinalysis, GFR> 60, troponin level 7, lactic acid 1.6, temperature normal, heart rate 57, 71, RR 27, oxygen saturation 100% on room air.  CT of head is motion limited, but is negative for acute issues.  Chest x-ray negative.  EKG: Sinus rhythm, QTc 524, left bundle blockade, poor R wave progression, LAD.   3/9: Vitals with mildly elevated blood pressure at 143/70.  Some improvement of leukocytosis to 11.7, hemoglobin decreased to 8.9 with no obvious bleeding, all cell lines decreased so some dilutional effect.  Patient with history of multiple falls, most likely autonomic dysfunction with his [name]'s disease.  Preliminary blood cultures negative.  Very difficult to obtain orthostatic vitals as patient was becoming very symptomatic and increased risk of fall. PT and OT are recommending SNF.  TOC is looking for  placement  3/10: Vital stable.  Leukocytosis resolved.  Hemoglobin at 9.3 which is around his baseline.  Preliminary blood cultures remain negative.  Nursing concern of worsening lethargy and dyskinesias which can be due to [name]'s.  Neurology was also consulted.  They are recommending MRI, EEG as he will be at high risk for stroke due to A-fib and not being on any anticoagulation.  Repeat EKG with improved QTc , restarting risperidone for hallucinations. Awaiting SNF placement.  3./11: Hemodynamically stable.  MRI brain was negative for any acute abnormality. Medically stable and awaiting SNF placement.   Assessment and Plan: * Fall Symptoms and history is more consistent with autonomic dysfunction and orthostatic vitals.  Most likely secondary to underlying advanced parkinsonism. PT/OT are recommending SNF. -Will continue home midodrine -Fall precautions  Orthostatic hypotension Unable to obtain proper orthostatic vitals as patient was becoming very symptomatic and unable to stand. Most likely autonomic dysfunction with advanced [name]'s. -Continue home midodrine  Ambulatory dysfunction Secondary to advanced [name]'s. PT is recommending SNF  Acute metabolic encephalopathy Might be due to worsening [name]'s.  Currently appears to be at his baseline where he is oriented to self and place.  CT head and MRI brain negative for any acute abnormality.  UA negative for UTI.   -Continue to monitor  [name]'s disease -Continue Sinemet  Paroxysmal atrial fibrillation (HCC) Heart rate well-controlled.  Not on any anticoagulation due to high risk of fall  CAD (coronary artery disease) -Continue aspirin  Leukocytosis May be reactive with fall.  Improving.  No obvious infection.  Blood cultures remain negative  in 24 hours. -Follow-up final culture results  Depression with anxiety Repeat EKG today with normalization of QTc. -Restarting home risperidone and  Zoloft -Continue entacapone -Monitor QTc  Normocytic anemia Some decrease of hemoglobin to 8.9.  No obvious bleeding.  All cell lines decreased so some dilutional effect. -Continue to monitor  Prolonged QT interval Repeat EKG today with improvement of QTc. -Continue to monitor -Restarting home Zoloft and risperidone   Subjective: Patient with no new complaints.  Still becoming dizzy with standing and attempts to ambulate.  Physical Exam: Vitals:   02/05/23 1518 02/05/23 1911 02/06/23 0317 02/06/23 0750  BP: 139/78 (!) 119/55 138/84 (!) 118/56  Pulse: 67 64 63 69  Resp: '16 20 18 16  '$ Temp: 98 F (36.7 C) 98.4 F (36.9 C) 97.8 F (36.6 C) 97.8 F (36.6 C)  TempSrc:  Oral    SpO2: 97% 96% 99% 100%  Weight:      Height:       General.  Frail gentleman, in no acute distress. Pulmonary.  Lungs clear bilaterally, normal respiratory effort. CV.  Regular rate and rhythm, no JVD, rub or murmur. Abdomen.  Soft, nontender, nondistended, BS positive. CNS.  Alert and oriented x 2.  No focal neurologic deficit. Extremities.  No edema, no cyanosis, pulses intact and symmetrical. Psychiatry.  Judgment and insight appears normal.   Data Reviewed: Prior data reviewed.  Family Communication: Discussed with POA Ermelinda Das  Disposition: Status is: In patient.  Planned Discharge Destination: Skilled nursing facility  Time spent: 40 minutes  This record has been created using Systems analyst. Errors have been sought and corrected,but may not always be located. Such creation errors do not reflect on the standard of care.   Author: Lorella Nimrod, MD 02/06/2023 12:43 PM  For on call review [email or web address].

## 2023-02-06 NOTE — Progress Notes (Signed)
RN noticed patient having difficulty with scooping food with utensils. Patient should be assisted with eating with each meal.   Fuller Mandril, RN

## 2023-02-06 NOTE — Procedures (Signed)
Routine EEG Report  Corey Morrison is a 67 y.o. male with a history of altered mental status who is undergoing an EEG to evaluate for seizures.  Report: This EEG was acquired with electrodes placed according to the International 10-20 electrode system (including Fp1, Fp2, F3, F4, C3, C4, P3, P4, O1, O2, T3, T4, T5, T6, A1, A2, Fz, Cz, Pz). The following electrodes were missing or displaced: none.  The occipital dominant rhythm was 5 Hz. This activity is reactive to stimulation. Drowsiness was manifested by background fragmentation; deeper stages of sleep were identified by K complexes and sleep spindles. There was no focal slowing. There were no interictal epileptiform discharges. There were no electrographic seizures identified. Photic stimulation and hyperventilation were not performed.  Impression and clinical correlation: This EEG was obtained while awake and asleep and is abnormal due to mild diffuse slowing indicative of global cerebral dysfunction. Epileptiform abnormalities were not seen during this recording.  Su Monks, MD [facility] [phone]  If 7pm- 7am, please page neurology on call as listed in [place].

## 2023-02-07 DIAGNOSIS — G9341 Metabolic encephalopathy: Secondary | ICD-10-CM

## 2023-02-07 MED ORDER — CARBIDOPA-LEVODOPA 25-100 MG PO TABS: 1.0000 | ORAL_TABLET | Freq: Three times a day (TID) | ORAL | 0 refills | Status: DC

## 2023-02-07 NOTE — TOC Progression Note (Signed)
Transition of Care [facility]) - Progression Note    Patient Details  Name: Ayoub Dockett MRN: [id number] Date of Birth: [birth year]  [facility]) CM/SW Contact  Beverly Sessions, RN Phone Number: 02/07/2023, 8:52 AM  Clinical Narrative:     Rosalie Gums [id number]   Expected Discharge Plan: [place] Barriers to Discharge: Continued Medical Work up  Expected Discharge Plan and Services     Post Acute Care Choice: [place] arrangements for the past 2 months: [place]                   DME Agency: NA                   Social Determinants of Health (SDOH) Interventions SDOH Screenings   Food Insecurity: No Food Insecurity (02/04/2023)  Housing: Low Risk  (02/04/2023)  Transportation Needs: No Transportation Needs (02/04/2023)  Utilities: Not At Risk (02/04/2023)  Tobacco Use: Medium Risk (02/04/2023)    Readmission Risk Interventions    07/23/2022    4:25 PM  Readmission Risk Prevention Plan  Transportation Screening Complete  Medication Review (RN Care Manager) Complete  PCP or Specialist appointment within 3-5 days of discharge Complete  HRI or [place] Complete  SW Recovery Care/Counseling Consult Complete  Palliative Care Screening Complete  Skilled Nursing Facility Complete

## 2023-02-07 NOTE — TOC Transition Note (Signed)
Transition of Care [facility]) - CM/SW Discharge Note   Patient Details  Name: Corey Morrison MRN: [id number] Date of Birth: [birth year]  [facility]) CM/SW Contact:  Beverly Sessions, RN Phone Number: 02/07/2023, 10:49 AM   Clinical Narrative:    Patient will DC [id number] Anticipated DC date: 02/07/23  Family notified:Rick Transport by: EMS  Per MD patient ready for DC to . RN, patient's family, and facility notified of DC. Discharge Summary sent to facility. RN given number for report. DC packet on chart. Ambulance transport requested for patient.  TOC signing off.  Isaias Cowman [facility] [phone]      Barriers to Discharge: Continued Medical Work up   Patient Goals and CMS Choice CMS Medicare.gov Compare Post Acute Care list provided to:: Patient Represenative (must comment) Liliane Channel) Choice offered to / list presented to : [place] / [place]  Discharge Placement                         Discharge Plan and Services Additional resources added to the After Visit Summary for       Post Acute Care Choice: [place]            DME Agency: NA                  Social Determinants of Health (SDOH) Interventions SDOH Screenings   Food Insecurity: No Food Insecurity (02/04/2023)  Housing: Low Risk  (02/04/2023)  Transportation Needs: No Transportation Needs (02/04/2023)  Utilities: Not At Risk (02/04/2023)  Tobacco Use: Medium Risk (02/04/2023)     Readmission Risk Interventions    07/23/2022    4:25 PM  Readmission Risk Prevention Plan  Transportation Screening Complete  Medication Review ([place]) Complete  PCP or Specialist appointment within 3-5 days of discharge Complete  HRI or [place] Complete  SW Recovery Care/Counseling Consult Complete  Palliative Care Screening Complete  [place] Complete

## 2023-02-07 NOTE — Discharge Summary (Signed)
Physician Discharge Summary   Patient: Corey Morrison MRN: [id number] DOB: 1955/12/06  Admit date:     02/03/2023  Discharge date: 02/07/23  Discharge Physician: Lorella Nimrod   PCP:    Recommendations at discharge:  Please obtain CBC and BMP in 1 week Follow-up with primary care provider Follow-up with his neurologist, concern of autonomic dysfunction with advanced [name]'s.  Discharge Diagnoses: Principal Problem:   Fall Active Problems:   Ambulatory dysfunction   Orthostatic hypotension   Acute metabolic encephalopathy   [name]'s disease   Paroxysmal atrial fibrillation (HCC)   CAD (coronary artery disease)   Leukocytosis   Depression with anxiety   Normocytic anemia   Prolonged QT interval   Altered mental status   Hospital Course: Taken from H&P. The patient is coming from assisted living facility    Corey Morrison is a 67 y.o. male with medical history significant of hyperlipidemia, CAD, depression with anxiety, [name]'s disease, orthostatic status, peripheral neuropathy, GERD, A-fib not on anticoagulants, who presents with fall. Per report,  staff in facility found pt leaning against his bed today. They attempted to stand him up and then he fell forward hitting his head. He developed a small hematoma in the left forehead.  Patient seem to be more confused than baseline.  At baseline, patient is oriented to person and place, usually not to time.  Patient with history of multiple falls and ED visits for that reason.  Per report, patient had hypotension with systolic blood pressure in 70s, which improved to 126/77 after giving 1 L normal saline bolus in ED.   Data reviewed independently and ED Course: pt was found to have WBC 12.3, negative urinalysis, GFR> 60, troponin level 7, lactic acid 1.6, temperature normal, heart rate 57, 71, RR 27, oxygen saturation 100% on room air.  CT of head is motion limited, but is negative for acute issues.  Chest x-ray negative.  EKG:  Sinus rhythm, QTc 524, left bundle blockade, poor R wave progression, LAD.   3/9: Vitals with mildly elevated blood pressure at 143/70.  Some improvement of leukocytosis to 11.7, hemoglobin decreased to 8.9 with no obvious bleeding, all cell lines decreased so some dilutional effect.  Patient with history of multiple falls, most likely autonomic dysfunction with his [name]'s disease.  Preliminary blood cultures negative.  Very difficult to obtain orthostatic vitals as patient was becoming very symptomatic and increased risk of fall. PT and OT are recommending SNF.  TOC is looking for placement  3/10: Vital stable.  Leukocytosis resolved.  Hemoglobin at 9.3 which is around his baseline.  Preliminary blood cultures remain negative.  Nursing concern of worsening lethargy and dyskinesias which can be due to [name]'s.  Neurology was also consulted.  They are recommending MRI, EEG as he will be at high risk for stroke due to A-fib and not being on any anticoagulation.  Repeat EKG with improved QTc , restarting risperidone for hallucinations. Awaiting SNF placement.  3./11: Hemodynamically stable.  MRI brain was negative for any acute abnormality. Medically stable and awaiting SNF placement.  3/12: Hemodynamically stable.  EEG with mild diffuse slowing indicative of global cerebral dysfunction.  No epileptiform abnormalities seen.  Obtained insurance authorization for SNF and patient is being discharged to rehab for further management.  Patient currently needing full assistance with meals and for his ADLs.  If does not improve with short-term rehab they might have to look for long-term care.  Also concern of autonomic dysfunction secondary to advanced [name]'s.  Patient  takes Sinemet 6 times daily, with the 1 tab is at 1000/1400/2100 and the 2 tablets 0700/1200/1700.  He will continue on current regimen and need to have a close follow-up with his providers for further  recommendations.    Assessment and Plan: * Fall Symptoms and history is more consistent with autonomic dysfunction and orthostatic vitals.  Most likely secondary to underlying advanced parkinsonism. PT/OT are recommending SNF. -Will continue home midodrine -Fall precautions  Orthostatic hypotension Unable to obtain proper orthostatic vitals as patient was becoming very symptomatic and unable to stand. Most likely autonomic dysfunction with advanced [name]'s. -Continue home midodrine  Ambulatory dysfunction Secondary to advanced [name]'s. PT is recommending SNF  Acute metabolic encephalopathy Might be due to worsening [name]'s.  Currently appears to be at his baseline where he is oriented to self and place.  CT head and MRI brain negative for any acute abnormality.  UA negative for UTI.   -Continue to monitor  [name]'s disease -Continue Sinemet  Paroxysmal atrial fibrillation (HCC) Heart rate well-controlled.  Not on any anticoagulation due to high risk of fall  CAD (coronary artery disease) -Continue aspirin  Leukocytosis May be reactive with fall.  Improving.  No obvious infection.  Blood cultures remain negative in 24 hours. -Follow-up final culture results  Depression with anxiety Repeat EKG today with normalization of QTc. -Restarting home risperidone and Zoloft -Continue entacapone -Monitor QTc  Normocytic anemia Some decrease of hemoglobin to 8.9.  No obvious bleeding.  All cell lines decreased so some dilutional effect. -Continue to monitor  Prolonged QT interval Repeat EKG today with improvement of QTc. -Continue to monitor -Restarting home Zoloft and risperidone   Consultants: Neurology Procedures performed: EEG Disposition: Skilled nursing facility Diet recommendation:  Discharge Diet Orders (From admission, onward)     Start     Ordered   02/07/23 0000  Diet - low sodium heart healthy        02/07/23 1029           Cardiac  diet DISCHARGE MEDICATION: Allergies as of 02/07/2023   No Known Allergies      Medication List     STOP taking these medications    esomeprazole 20 MG capsule Commonly known as: [place] these medications    acetaminophen 325 MG tablet Commonly known as: TYLENOL Take 650 mg by mouth every 6 (six) hours. 0800/1400/2000   aspirin EC 81 MG tablet Take 81 mg by mouth daily.   Biofreeze 4 % Gel Generic drug: Menthol (Topical Analgesic) SMARTSIG:sparingly Topical 3 Times Daily   capsicum 0.075 % topical cream Commonly known as: ZOSTRIX Apply 1 Application topically 2 (two) times daily. (Apply to feet and thighs)   carbidopa-levodopa 25-100 MG tablet Commonly known as: SINEMET IR Take 1 tablet by mouth 6 (six) times daily. What changed:  when to take this additional instructions Another medication with the same name was removed. Continue taking this medication, and follow the directions you see here.   carbidopa-levodopa 25-100 MG tablet Commonly known as: SINEMET IR Take 2 tablets by mouth 3 (three) times daily. 0700/1200/1700 What changed:  Another medication with the same name was changed. Make sure you understand how and when to take each. Another medication with the same name was removed. Continue taking this medication, and follow the directions you see here.   cyanocobalamin 1000 MCG tablet Commonly known as: VITAMIN B12 Take 1,000 mcg by mouth daily.   entacapone 200 MG tablet Commonly known  as: COMTAN Take 1 tablet (200 mg total) by mouth 6 (six) times daily. What changed: additional instructions   ezetimibe 10 MG tablet Commonly known as: ZETIA Take 10 mg by mouth daily.   fluticasone 50 MCG/ACT nasal spray Commonly known as: FLONASE Place 1 spray into both nostrils daily.   gabapentin 100 MG capsule Commonly known as: NEURONTIN Take 2 capsules (200 mg total) by mouth 3 (three) times daily. What changed: additional instructions    guaifenesin 100 MG/5ML syrup Commonly known as: ROBITUSSIN Take 300 mg by mouth every 6 (six) hours as needed for cough.   loperamide 2 MG tablet Commonly known as: IMODIUM A-D Take 4 mg by mouth as needed for diarrhea or loose stools.   magnesium hydroxide 400 MG/5ML suspension Commonly known as: MILK OF MAGNESIA Take 30 mLs by mouth daily as needed for mild constipation.   melatonin 5 MG Tabs Take 1 tablet (5 mg total) by mouth at bedtime.   midodrine 2.5 MG tablet Commonly known as: PROAMATINE Take 7.5 mg by mouth 3 (three) times daily with meals. 0900/1200/1600   omeprazole 20 MG capsule Commonly known as: PRILOSEC Take 20 mg by mouth daily.   polyethylene glycol 17 g packet Commonly known as: MIRALAX / GLYCOLAX Take 17 g by mouth daily as needed for mild constipation.   psyllium 0.52 g capsule Commonly known as: REGULOID Take 0.52 g by mouth at bedtime.   risperiDONE 0.5 MG tablet Commonly known as: RISPERDAL TAKE 1 TABLET BY MOUTH 2 TIMES PER DAY What changed:  how much to take how to take this when to take this additional instructions   sertraline 100 MG tablet Commonly known as: ZOLOFT Take 1 tablet (100 mg total) by mouth daily. What changed: how much to take   Vitamin D 50 MCG (2000 UT) Caps Take 2,000 Units by mouth daily.        Contact information for after-discharge care     [facility] .   Service: Skilled Nursing Contact information: [address] [place] [phone]                    Discharge Exam: Danley Danker Weights   02/03/23 1158  Weight: 68 kg   General.  Frail gentleman, in no acute distress. Pulmonary.  Lungs clear bilaterally, normal respiratory effort. CV.  Regular rate and rhythm, no JVD, rub or murmur. Abdomen.  Soft, nontender, nondistended, BS positive. CNS.  Alert and oriented x 2.  No focal neurologic  deficit. Extremities.  No edema, no cyanosis, pulses intact and symmetrical. Psychiatry.  Flat affect  Condition at discharge: stable  The results of significant diagnostics from this hospitalization (including imaging, microbiology, ancillary and laboratory) are listed below for reference.   Imaging Studies: EEG adult  Result Date: 03-04-23 Derek Jack, MD     2023/03/04  7:21 PM Routine EEG Report Corey Morrison is a 67 y.o. male with a history of altered mental status who is undergoing an EEG to evaluate for seizures. Report: This EEG was acquired with electrodes placed according to the International 10-20 electrode system (including Fp1, Fp2, F3, F4, C3, C4, P3, P4, O1, O2, T3, T4, T5, T6, A1, A2, Fz, Cz, Pz). The following electrodes were missing or displaced: none. The occipital dominant rhythm was 5 Hz. This activity is reactive to stimulation. Drowsiness was manifested by background fragmentation; deeper stages of sleep were identified  by K complexes and sleep spindles. There was no focal slowing. There were no interictal epileptiform discharges. There were no electrographic seizures identified. Photic stimulation and hyperventilation were not performed. Impression and clinical correlation: This EEG was obtained while awake and asleep and is abnormal due to mild diffuse slowing indicative of global cerebral dysfunction. Epileptiform abnormalities were not seen during this recording. Corey Monks, MD [facility] [phone] If 7pm- 7am, please page neurology on call as listed in [place].   MR BRAIN WO CONTRAST  Result Date: 02/05/2023 CLINICAL DATA:  Mental status change, persistent or worsening. Altered mental status with frequent falls. History of [name]'s disease. EXAM: MRI HEAD WITHOUT CONTRAST TECHNIQUE: Multiplanar, multiecho pulse sequences of the brain and surrounding structures were obtained without intravenous contrast. COMPARISON:  Head CT 02/03/2023. FINDINGS:  Brain: No acute infarct or hemorrhage. Mild chronic small-vessel disease. No mass or midline shift. No hydrocephalus or extra-axial collection. Focal susceptibility within the right caudate head without other associated signal changes, of uncertain etiology. Vascular: Normal flow voids. Skull and upper cervical spine: No suspicious marrow lesions. Sinuses/Orbits: Unremarkable. Other: None. IMPRESSION: No acute intracranial abnormality or mass. Electronically Signed   By: Emmit Alexanders M.D.   On: 02/05/2023 14:56   CT Head Wo Contrast  Result Date: 02/03/2023 CLINICAL DATA:  Head trauma, minor (Age >= 65y) EXAM: CT HEAD WITHOUT CONTRAST TECHNIQUE: Contiguous axial images were obtained from the base of the skull through the vertex without intravenous contrast. RADIATION DOSE REDUCTION: This exam was performed according to the departmental dose-optimization program which includes automated exposure control, adjustment of the mA and/or kV according to patient size and/or use of iterative reconstruction technique. COMPARISON:  CT head February 01, 2023. FINDINGS: Motion limited study. Brain: No evidence of acute infarction, hemorrhage, hydrocephalus, extra-axial collection or mass lesion/mass effect. Vascular: No hyperdense vessel identified. Skull: No acute fracture. Sinuses/Orbits: Clear sinuses.  No acute orbital findings. Other: No mastoid effusions. IMPRESSION: Motion limited study without evidence of acute abnormality. Electronically Signed   By: Margaretha Sheffield M.D.   On: 02/03/2023 12:38   DG Chest 1 View  Result Date: 02/03/2023 CLINICAL DATA:  Fall. EXAM: CHEST  1 VIEW COMPARISON:  November 06, 2022. FINDINGS: The heart size and mediastinal contours are within normal limits. Both lungs are clear. The visualized skeletal structures are unremarkable. IMPRESSION: No active disease. Electronically Signed   By: Marijo Conception M.D.   On: 02/03/2023 12:22   CT Head Wo Contrast  Result Date:  02/01/2023 CLINICAL DATA:  Head trauma, minor (Age >= 65y); Neck trauma (Age >= 65y). Fall. EXAM: CT HEAD WITHOUT CONTRAST CT CERVICAL SPINE WITHOUT CONTRAST TECHNIQUE: Multidetector CT imaging of the head and cervical spine was performed following the standard protocol without intravenous contrast. Multiplanar CT image reconstructions of the cervical spine were also generated. RADIATION DOSE REDUCTION: This exam was performed according to the departmental dose-optimization program which includes automated exposure control, adjustment of the mA and/or kV according to patient size and/or use of iterative reconstruction technique. COMPARISON:  CT head and cervical spine 12/11/2022 FINDINGS: CT HEAD FINDINGS Brain: There is no evidence of an acute infarct, intracranial hemorrhage, mass, midline shift, or extra-axial fluid collection. The ventricles and sulci are within normal limits for age. Vascular: Calcified atherosclerosis at the skull base. No hyperdense vessel. Skull: No acute fracture or suspicious osseous lesion. Sinuses/Orbits: Visualized paranasal sinuses and mastoid air cells are clear. Unremarkable orbits. Other: None. CT CERVICAL SPINE FINDINGS Alignment: Chronic reversal of  the normal cervical lordosis. Fused grade 1 retrolisthesis of C5 on C6. Mild right convex curvature of the cervical spine. Skull base and vertebrae: No acute fracture or suspicious osseous lesion. Soft tissues and spinal canal: No prevertebral fluid or swelling. No visible canal hematoma. Disc levels: Solid interbody osseous fusion at C5-6 with osteophytic ridging greatest in the right paracentral region resulting in mild-to-moderate right-sided spinal stenosis. Moderate to severe disc space narrowing at C6-7 with a broad-based posterior disc osteophyte complex and asymmetric left uncovertebral spurring resulting in mild spinal stenosis and mild right and moderate left neural foraminal stenosis. Asymmetric left-sided disc degeneration  at C4-5 with uncovertebral spurring resulting in moderate left neural foraminal stenosis. Upper chest: Clear lung apices. Other: None. IMPRESSION: 1. No evidence of acute intracranial abnormality. 2. No acute cervical spine fracture. Electronically Signed   By: Logan Bores M.D.   On: 02/01/2023 08:53   CT Cervical Spine Wo Contrast  Result Date: 02/01/2023 CLINICAL DATA:  Head trauma, minor (Age >= 65y); Neck trauma (Age >= 65y). Fall. EXAM: CT HEAD WITHOUT CONTRAST CT CERVICAL SPINE WITHOUT CONTRAST TECHNIQUE: Multidetector CT imaging of the head and cervical spine was performed following the standard protocol without intravenous contrast. Multiplanar CT image reconstructions of the cervical spine were also generated. RADIATION DOSE REDUCTION: This exam was performed according to the departmental dose-optimization program which includes automated exposure control, adjustment of the mA and/or kV according to patient size and/or use of iterative reconstruction technique. COMPARISON:  CT head and cervical spine 12/11/2022 FINDINGS: CT HEAD FINDINGS Brain: There is no evidence of an acute infarct, intracranial hemorrhage, mass, midline shift, or extra-axial fluid collection. The ventricles and sulci are within normal limits for age. Vascular: Calcified atherosclerosis at the skull base. No hyperdense vessel. Skull: No acute fracture or suspicious osseous lesion. Sinuses/Orbits: Visualized paranasal sinuses and mastoid air cells are clear. Unremarkable orbits. Other: None. CT CERVICAL SPINE FINDINGS Alignment: Chronic reversal of the normal cervical lordosis. Fused grade 1 retrolisthesis of C5 on C6. Mild right convex curvature of the cervical spine. Skull base and vertebrae: No acute fracture or suspicious osseous lesion. Soft tissues and spinal canal: No prevertebral fluid or swelling. No visible canal hematoma. Disc levels: Solid interbody osseous fusion at C5-6 with osteophytic ridging greatest in the right  paracentral region resulting in mild-to-moderate right-sided spinal stenosis. Moderate to severe disc space narrowing at C6-7 with a broad-based posterior disc osteophyte complex and asymmetric left uncovertebral spurring resulting in mild spinal stenosis and mild right and moderate left neural foraminal stenosis. Asymmetric left-sided disc degeneration at C4-5 with uncovertebral spurring resulting in moderate left neural foraminal stenosis. Upper chest: Clear lung apices. Other: None. IMPRESSION: 1. No evidence of acute intracranial abnormality. 2. No acute cervical spine fracture. Electronically Signed   By: Logan Bores M.D.   On: 02/01/2023 08:53    Microbiology: Results for orders placed or performed during the hospital encounter of 02/03/23  Blood culture (single)     Status: None (Preliminary result)   Collection Time: 02/03/23  1:13 PM   Specimen: BLOOD  Result Value Ref Range Status   Specimen Description BLOOD BLOOD RIGHT FOREARM  Final   Special Requests   Final    BOTTLES DRAWN AEROBIC AND ANAEROBIC Blood Culture adequate volume   Culture   Final    NO GROWTH 4 DAYS Performed at [facility], [address], [place], [place] [address]    Report Status PENDING  Incomplete    Labs:  CBC: Recent Labs  Lab 02/01/23 0858 02/03/23 1202 02/04/23 0446 02/05/23 0618  WBC 7.2 12.3* 11.7* 10.3  NEUTROABS 6.0  --   --   --   HGB 10.3* 10.1* 8.9* 9.3*  HCT 31.9* 31.6* 27.7* 29.7*  MCV 96.1 96.0 95.5 96.7  PLT 151 149* 136* [id number]*   Basic Metabolic Panel: Recent Labs  Lab 02/01/23 0858 02/03/23 1202 02/04/23 0446  NA 141 138 141  K 4.7 4.3 4.1  CL 108 105 109  CO2 '25 25 27  '$ GLUCOSE 104* 102* 94  BUN 31* 36* 24*  CREATININE 1.00 1.10 0.83  CALCIUM 8.9 8.8* 8.5*   Liver Function Tests: Recent Labs  Lab 02/03/23 1202  AST 14*  ALT <5  ALKPHOS 71  BILITOT 0.9  PROT 6.8  ALBUMIN 3.8   CBG: Recent Labs  Lab 02/03/23 1150  GLUCAP 96    Discharge time  spent: greater than 30 minutes.  This record has been created using Systems analyst. Errors have been sought and corrected,but may not always be located. Such creation errors do not reflect on the standard of care.   Signed: Lorella Nimrod, MD [facility] 02/07/2023

## 2023-02-08 LAB — CULTURE, BLOOD (SINGLE)
Culture: NO GROWTH
Special Requests: ADEQUATE

## 2023-04-13 ENCOUNTER — Emergency Department
Admission: EM | Admit: 2023-04-13 | Discharge: 2023-04-13 | Disposition: A | Discharge status: Home / Self Care | Payer: Medicare Other | Attending: Emergency Medicine | Admitting: Emergency Medicine

## 2023-04-13 ENCOUNTER — Other Ambulatory Visit: Payer: Self-pay

## 2023-04-13 ENCOUNTER — Encounter: Payer: Self-pay | Admitting: Emergency Medicine

## 2023-04-13 ENCOUNTER — Emergency Department: Payer: Medicare Other

## 2023-04-13 DIAGNOSIS — G20C Parkinsonism, unspecified: Secondary | ICD-10-CM | POA: Diagnosis not present

## 2023-04-13 DIAGNOSIS — W19XXXA Unspecified fall, initial encounter: Secondary | ICD-10-CM | POA: Diagnosis not present

## 2023-04-13 DIAGNOSIS — R040 Epistaxis: Secondary | ICD-10-CM | POA: Diagnosis not present

## 2023-04-13 DIAGNOSIS — I2510 Atherosclerotic heart disease of native coronary artery without angina pectoris: Secondary | ICD-10-CM | POA: Diagnosis not present

## 2023-04-13 DIAGNOSIS — R296 Repeated falls: Secondary | ICD-10-CM

## 2023-04-13 NOTE — ED Provider Notes (Signed)
[facility] Provider Note    Event Date/Time   First MD Initiated Contact with Patient 04/13/23 1241     (approximate)   History   Fall   HPI  Corey Morrison is a 67 y.o. male to the ED via EMS from the [place] [facility]  [place] for an unwitnessed fall.  Patient had a nosebleed which is controlled.  Patient is not on any blood thinners and denies loss of consciousness.  Patient has a history of [name]'s disease, paroxysmal atrial fibrillation, CAD, anxiety.      Physical Exam   Triage Vital Signs: ED Triage Vitals  Enc Vitals Group     BP 04/13/23 1213 (!) 101/49     Pulse Rate 04/13/23 1213 86     Resp 04/13/23 1213 18     Temp 04/13/23 1213 98.6 F (37 C)     Temp Source 04/13/23 1213 Oral     SpO2 04/13/23 1213 97 %     Weight --      Height --      Head Circumference --      Peak Flow --      Pain Score 04/13/23 1212 2     Pain Loc --      Pain Edu? --      Excl. in GC? --     Most recent vital signs: Vitals:   04/13/23 1213 04/13/23 1402  BP: (!) 101/49 (!) 105/50  Pulse: 86 80  Resp: 18 16  Temp: 98.6 F (37 C)   SpO2: 97% 98%     General: Awake, no distress.  Alert, able answer questions.  Patient has noted to be picking at his left nostril which is the one that has evidence of old bleeding. CV:  Good peripheral perfusion.  Resp:  Normal effort.  Lungs clear bilaterally.  No tenderness is noted on palpation of the ribs bilaterally.   Abd:  No distention.  Soft. Other:  No abrasions or discoloration noted of the scalp.  Nontender on palpation.  No cervical tenderness on palpation posteriorly.Patient is able to move upper and lower extremities without any difficulty.   ED Results / Procedures / Treatments   Labs (all labs ordered are listed, but only abnormal results are displayed) Labs Reviewed - No data to display   RADIOLOGY CT head and cervical spine per radiologist is negative for acute intracranial changes and CT  cervical spine is negative for acute cervical fracture or malalignment.    PROCEDURES:  Critical Care performed:   Procedures   MEDICATIONS ORDERED IN ED: Medications - No data to display   IMPRESSION / MDM / ASSESSMENT AND PLAN / ED COURSE  I reviewed the triage vital signs and the nursing notes.   Differential diagnosis includes, but is not limited to, skull fracture, intracranial hemorrhage, cervical fracture, contusion, cervical strain, muscle skeletal pain secondary to fall.  66 year old male was brought to the ED via EMS from the [place] [facility] [place] after an unwitnessed fall.  CT head and cervical spine were performed and radiology reports no acute changes which was reassuring.  No other injuries reported.  No active epistaxis and is questionable whether this was self-inflicted as patient keeps picking at the left nostril.  Patient is being transferred back to the [facility] [place] where he resides.      Patient's presentation is most consistent with acute presentation with potential threat to life or bodily function.  FINAL CLINICAL IMPRESSION(S) / ED DIAGNOSES  Final diagnoses:  Unwitnessed fall  Anterior epistaxis     Rx / DC Orders   ED Discharge Orders     None        Note:  This document was prepared using Dragon voice recognition software and may include unintentional dictation errors.   Tommi Rumps, PA-C 04/13/23 1439    Pilar Jarvis, MD 04/13/23 [phone]

## 2023-04-13 NOTE — ED Notes (Signed)
Pt taken to CT and then they will bring pt to ED 52

## 2023-04-13 NOTE — ED Triage Notes (Signed)
Patient to ED via [facility] from [facility] [place] after a unwitnessed fall. Patient has nose bleed from fall-bleeding controlled at this time. No blood thinners or LOC. Hx of parkinson

## 2023-04-13 NOTE — Discharge Instructions (Signed)
Continue with your regular medication as prescribed by your doctor.  Do not pick at your nose which may increase risk of nosebleed.

## 2023-06-10 ENCOUNTER — Emergency Department: Payer: Medicare Other

## 2023-06-10 ENCOUNTER — Other Ambulatory Visit: Payer: Self-pay

## 2023-06-10 ENCOUNTER — Emergency Department
Admission: EM | Admit: 2023-06-10 | Discharge: 2023-06-11 | Disposition: A | Discharge status: Home / Self Care | Payer: Medicare Other | Attending: Emergency Medicine | Admitting: Emergency Medicine

## 2023-06-10 DIAGNOSIS — W19XXXA Unspecified fall, initial encounter: Secondary | ICD-10-CM | POA: Diagnosis not present

## 2023-06-10 DIAGNOSIS — S0990XA Unspecified injury of head, initial encounter: Secondary | ICD-10-CM | POA: Diagnosis not present

## 2023-06-10 DIAGNOSIS — G20A1 Parkinson's disease without dyskinesia, without mention of fluctuations: Secondary | ICD-10-CM | POA: Insufficient documentation

## 2023-06-10 DIAGNOSIS — I2510 Atherosclerotic heart disease of native coronary artery without angina pectoris: Secondary | ICD-10-CM | POA: Diagnosis not present

## 2023-06-10 DIAGNOSIS — S5002XA Contusion of left elbow, initial encounter: Secondary | ICD-10-CM | POA: Insufficient documentation

## 2023-06-10 DIAGNOSIS — S59902A Unspecified injury of left elbow, initial encounter: Secondary | ICD-10-CM | POA: Diagnosis present

## 2023-06-10 LAB — COMPREHENSIVE METABOLIC PANEL
ALT: 6 U/L (ref 0–44)
AST: 10 U/L — ABNORMAL LOW (ref 15–41)
Albumin: 4.1 g/dL (ref 3.5–5.0)
Alkaline Phosphatase: 59 U/L (ref 38–126)
Anion gap: 8 (ref 5–15)
BUN: 24 mg/dL — ABNORMAL HIGH (ref 8–23)
CO2: 25 mmol/L (ref 22–32)
Calcium: 8.9 mg/dL (ref 8.9–10.3)
Chloride: 107 mmol/L (ref 98–111)
Creatinine, Ser: 0.85 mg/dL (ref 0.61–1.24)
GFR, Estimated: >60 mL/min (ref >60)
Glucose, Bld: 90 mg/dL (ref 70–99)
Potassium: 4.3 mmol/L (ref 3.5–5.1)
Sodium: 140 mmol/L (ref 135–145)
Total Bilirubin: 0.7 mg/dL (ref 0.3–1.2)
Total Protein: 7.2 g/dL (ref 6.5–8.1)

## 2023-06-10 LAB — URINALYSIS, ROUTINE W REFLEX MICROSCOPIC
Bilirubin Urine: NEGATIVE
Glucose, UA: NEGATIVE mg/dL
Hgb urine dipstick: NEGATIVE
Ketones, ur: 5 mg/dL — AB
Leukocytes,Ua: NEGATIVE
Nitrite: NEGATIVE
Protein, ur: NEGATIVE mg/dL
Specific Gravity, Urine: 1.016 (ref 1.005–1.030)
pH: 6 (ref 5.0–8.0)

## 2023-06-10 LAB — CBC WITH DIFFERENTIAL/PLATELET
Abs Immature Granulocytes: 0.03 10*3/uL (ref 0.00–0.07)
Basophils Absolute: 0 10*3/uL (ref 0.0–0.1)
Basophils Relative: 0 %
Eosinophils Absolute: 0.1 10*3/uL (ref 0.0–0.5)
Eosinophils Relative: 1 %
HCT: 33.8 % — ABNORMAL LOW (ref 39.0–52.0)
Hemoglobin: 11.4 g/dL — ABNORMAL LOW (ref 13.0–17.0)
Immature Granulocytes: 0 %
Lymphocytes Relative: 10 %
Lymphs Abs: 0.9 10*3/uL (ref 0.7–4.0)
MCH: 30.3 pg (ref 26.0–34.0)
MCHC: 33.7 g/dL (ref 30.0–36.0)
MCV: 89.9 fL (ref 80.0–100.0)
Monocytes Absolute: 0.6 10*3/uL (ref 0.1–1.0)
Monocytes Relative: 7 %
Neutro Abs: 7 10*3/uL (ref 1.7–7.7)
Neutrophils Relative %: 82 %
Platelets: 178 10*3/uL (ref 150–400)
RBC: 3.76 MIL/uL — ABNORMAL LOW (ref 4.22–5.81)
RDW: 13.2 % (ref 11.5–15.5)
WBC: 8.6 10*3/uL (ref 4.0–10.5)
nRBC: 0 % (ref 0.0–0.2)

## 2023-06-10 NOTE — ED Notes (Signed)
[facility] called for transport to the [facility] [place] 

## 2023-06-10 NOTE — ED Notes (Signed)
Attempted report to [place] x1.

## 2023-06-10 NOTE — ED Provider Notes (Signed)
[facility] Provider Note    Event Date/Time   First MD Initiated Contact with Patient 06/10/23 1930     (approximate)   History   Fall   HPI  Corey Morrison is a 67 y.o. male with PMH of [name]'s disease, CAD and A-fib presents for evaluation after 2 mechanical falls today.  Patient does not think he hit his head and did not lose consciousness.  His falls were unwitnessed.  He denies any pain or known injury.  He describes having increased difficulty moving which he attributes to the [name]'s, however he is unable to explain what happened.      Physical Exam   Triage Vital Signs: ED Triage Vitals  Encounter Vitals Group     BP 06/10/23 1819 117/72     Systolic BP Percentile --      Diastolic BP Percentile --      Pulse Rate 06/10/23 1819 76     Resp 06/10/23 1819 16     Temp 06/10/23 1819 97.6 F (36.4 C)     Temp Source 06/10/23 1819 Oral     SpO2 06/10/23 1819 95 %     Weight 06/10/23 1817 156 lb (70.8 kg)     Height 06/10/23 1817 5\' 9"  (1.753 m)     Head Circumference --      Peak Flow --      Pain Score 06/10/23 1820 0     Pain Loc --      Pain Education --      Exclude from Growth Chart --     Most recent vital signs: Vitals:   06/10/23 1819 06/10/23 2133  BP: 117/72 (!) 162/119  Pulse: 76 72  Resp: 16 14  Temp: 97.6 F (36.4 C) 97.7 F (36.5 C)  SpO2: 95% 100%    General: Awake, no distress.  Difficult for patient to complete full sentences, some tangential speech and possible hallucinations. CV:  Good peripheral perfusion.  RRR. Resp:  Normal effort.  CTAB. Abd:  No distention.  Soft, nontender. Other:  No tenderness to palpation across entire body.  Small abrasion to the left lateral elbow, otherwise no abrasions or contusions noted.   ED Results / Procedures / Treatments   Labs (all labs ordered are listed, but only abnormal results are displayed) Labs Reviewed  COMPREHENSIVE METABOLIC PANEL - Abnormal;  Notable for the following components:      Result Value   BUN 24 (*)    AST 10 (*)    All other components within normal limits  CBC WITH DIFFERENTIAL/PLATELET - Abnormal; Notable for the following components:   RBC 3.76 (*)    Hemoglobin 11.4 (*)    HCT 33.8 (*)    All other components within normal limits  URINALYSIS, ROUTINE W REFLEX MICROSCOPIC - Abnormal; Notable for the following components:   Color, Urine AMBER (*)    APPearance CLEAR (*)    Ketones, ur 5 (*)    All other components within normal limits     EKG  EKG showed NSR with a known LBBB with elevations in V1-V4 that does not meet Sgarbossa criteria. VR 75 bpm PR 168 ms QRS 140 ms   RADIOLOGY  Chest x-ray, CT head and CT cervical spine obtained in the ED today.  I interpreted the images as well as reviewed the radiologist report.   PROCEDURES:  Critical Care performed: No  Procedures   MEDICATIONS ORDERED IN ED: Medications - No data to  display   IMPRESSION / MDM / ASSESSMENT AND PLAN / ED COURSE  I reviewed the triage vital signs and the nursing notes.                             67 year old male presents for evaluation after multiple falls today and is unable to explain what happened.  Patient was hypertensive in triage but otherwise VSS.  On exam patient is NAD.  Differential diagnosis includes, but is not limited to, mechanical fall secondary to [name]'s, intracranial bleed, stroke, arrhythmia.  Patient's presentation is most consistent with acute complicated illness / injury requiring diagnostic workup.  Patient was a poor historian and was unable to explain to me what caused his falls, so I completed a workup to look for a potential cause other than something mechanical.  Chest x-ray, CT head and cervical spine were obtained, I interpreted the images as well as reviewed the radiologist report.  There was no acute intracranial abnormality and no acute fracture or listhesis of the cervical  spine.  Chest x-ray did not show any active cardiopulmonary disease.  EKG showed NSR with known LBBB as noted above.  UA WNL aside from presence of ketones.  CMP normal aside from mildly elevated BUN and low AST.  CBC showed mild anemia, which has actually improved since his blood work on 02/05/2023.   Since I was unable to find another cause for his falls I believe that they are due to progression of his [name]'s disease. Given patient's lack of complaints and normal physical exam as well as the negative workup I feel he is stable for outpatient management.  Patient can follow-up with his PCP as needed.  I reviewed all the results with the patient, he voiced understanding.  Patient was stable at discharge.     FINAL CLINICAL IMPRESSION(S) / ED DIAGNOSES   Final diagnoses:  Accident due to mechanical fall without injury, initial encounter     Rx / DC Orders   ED Discharge Orders     None        Note:  This document was prepared using Dragon voice recognition software and may include unintentional dictation errors.   Cameron Ali, PA-C 06/10/23 2302    Dionne Bucy, MD 06/11/23 1109

## 2023-06-10 NOTE — ED Notes (Signed)
PT forearm was bleeding, when asked what happened he stated he feel at his assisted living facility. Put gauze on it and applied pressure with wrapping it up.

## 2023-06-10 NOTE — ED Triage Notes (Signed)
Pt to ED from [facility] [place], 2 mechanical falls today. Denies LOC, head trauma. Falls were unwitnessed. Hx PD with more frequent falls recently, uses walker. Denies pain or known injury from falls. Small abrasion to L elbow area. Pt alert, oriented with unlabored respirations. Denies pain. Pt unsure what part of body he fell on, states fell forward.

## 2023-06-10 NOTE — ED Notes (Signed)
EMS report: From the [facility] [place], AEMS 2 unwitnessed falls today. No thinners, no LOC, no head trauma Staff told EMS more falls recently. Uses walker, hx PD CBG 156, T 97.2, 168/78, HR 80, 100%

## 2023-06-10 NOTE — Discharge Instructions (Signed)
Please follow-up with your primary care as needed for management of your [name]'s disease.

## 2023-07-10 ENCOUNTER — Inpatient Hospital Stay
Admission: EM | Admit: 2023-07-10 | Discharge: 2023-07-18 | DRG: 312 | Disposition: A | Discharge status: Home / Self Care | Payer: Medicare Other | Attending: Internal Medicine | Admitting: Internal Medicine

## 2023-07-10 ENCOUNTER — Other Ambulatory Visit: Payer: Self-pay

## 2023-07-10 ENCOUNTER — Encounter: Payer: Self-pay | Admitting: Family Medicine

## 2023-07-10 ENCOUNTER — Emergency Department: Payer: Medicare Other

## 2023-07-10 DIAGNOSIS — G20A1 Parkinson's disease without dyskinesia, without mention of fluctuations: Secondary | ICD-10-CM | POA: Diagnosis present

## 2023-07-10 DIAGNOSIS — D649 Anemia, unspecified: Secondary | ICD-10-CM | POA: Diagnosis present

## 2023-07-10 DIAGNOSIS — I482 Chronic atrial fibrillation, unspecified: Secondary | ICD-10-CM | POA: Diagnosis not present

## 2023-07-10 DIAGNOSIS — Z66 Do not resuscitate: Secondary | ICD-10-CM | POA: Diagnosis present

## 2023-07-10 DIAGNOSIS — G4700 Insomnia, unspecified: Secondary | ICD-10-CM | POA: Diagnosis present

## 2023-07-10 DIAGNOSIS — Z1152 Encounter for screening for COVID-19: Secondary | ICD-10-CM

## 2023-07-10 DIAGNOSIS — R42 Dizziness and giddiness: Secondary | ICD-10-CM

## 2023-07-10 DIAGNOSIS — R5381 Other malaise: Secondary | ICD-10-CM | POA: Diagnosis present

## 2023-07-10 DIAGNOSIS — R451 Restlessness and agitation: Secondary | ICD-10-CM | POA: Diagnosis not present

## 2023-07-10 DIAGNOSIS — I454 Nonspecific intraventricular block: Secondary | ICD-10-CM | POA: Diagnosis present

## 2023-07-10 DIAGNOSIS — Z7982 Long term (current) use of aspirin: Secondary | ICD-10-CM

## 2023-07-10 DIAGNOSIS — I480 Paroxysmal atrial fibrillation: Secondary | ICD-10-CM | POA: Diagnosis present

## 2023-07-10 DIAGNOSIS — F419 Anxiety disorder, unspecified: Secondary | ICD-10-CM | POA: Diagnosis present

## 2023-07-10 DIAGNOSIS — I951 Orthostatic hypotension: Secondary | ICD-10-CM | POA: Diagnosis not present

## 2023-07-10 DIAGNOSIS — R262 Difficulty in walking, not elsewhere classified: Secondary | ICD-10-CM | POA: Diagnosis present

## 2023-07-10 DIAGNOSIS — D6869 Other thrombophilia: Secondary | ICD-10-CM | POA: Diagnosis present

## 2023-07-10 DIAGNOSIS — I2510 Atherosclerotic heart disease of native coronary artery without angina pectoris: Secondary | ICD-10-CM | POA: Diagnosis present

## 2023-07-10 DIAGNOSIS — A419 Sepsis, unspecified organism: Secondary | ICD-10-CM | POA: Insufficient documentation

## 2023-07-10 DIAGNOSIS — E785 Hyperlipidemia, unspecified: Secondary | ICD-10-CM | POA: Diagnosis present

## 2023-07-10 DIAGNOSIS — R55 Syncope and collapse: Secondary | ICD-10-CM

## 2023-07-10 DIAGNOSIS — Z9181 History of falling: Secondary | ICD-10-CM

## 2023-07-10 DIAGNOSIS — Z87891 Personal history of nicotine dependence: Secondary | ICD-10-CM

## 2023-07-10 DIAGNOSIS — Z79899 Other long term (current) drug therapy: Secondary | ICD-10-CM

## 2023-07-10 DIAGNOSIS — R531 Weakness: Principal | ICD-10-CM

## 2023-07-10 DIAGNOSIS — K219 Gastro-esophageal reflux disease without esophagitis: Secondary | ICD-10-CM | POA: Diagnosis present

## 2023-07-10 DIAGNOSIS — D696 Thrombocytopenia, unspecified: Secondary | ICD-10-CM | POA: Diagnosis present

## 2023-07-10 LAB — BASIC METABOLIC PANEL
Anion gap: 9 (ref 5–15)
BUN: 33 mg/dL — ABNORMAL HIGH (ref 8–23)
CO2: 27 mmol/L (ref 22–32)
Calcium: 8.8 mg/dL — ABNORMAL LOW (ref 8.9–10.3)
Chloride: 105 mmol/L (ref 98–111)
Creatinine, Ser: 0.96 mg/dL (ref 0.61–1.24)
GFR, Estimated: >60 mL/min (ref >60)
Glucose, Bld: 101 mg/dL — ABNORMAL HIGH (ref 70–99)
Potassium: 3.9 mmol/L (ref 3.5–5.1)
Sodium: 141 mmol/L (ref 135–145)

## 2023-07-10 LAB — URINALYSIS, ROUTINE W REFLEX MICROSCOPIC
Bilirubin Urine: NEGATIVE
Glucose, UA: NEGATIVE mg/dL
Hgb urine dipstick: NEGATIVE
Ketones, ur: 5 mg/dL — AB
Leukocytes,Ua: NEGATIVE
Nitrite: NEGATIVE
Protein, ur: NEGATIVE mg/dL
Specific Gravity, Urine: 1.023 (ref 1.005–1.030)
pH: 5 (ref 5.0–8.0)

## 2023-07-10 LAB — SARS CORONAVIRUS 2 BY RT PCR: SARS Coronavirus 2 by RT PCR: NEGATIVE

## 2023-07-10 LAB — CBC
HCT: 34.4 % — ABNORMAL LOW (ref 39.0–52.0)
Hemoglobin: 11.1 g/dL — ABNORMAL LOW (ref 13.0–17.0)
MCH: 30.2 pg (ref 26.0–34.0)
MCHC: 32.3 g/dL (ref 30.0–36.0)
MCV: 93.5 fL (ref 80.0–100.0)
Platelets: 147 10*3/uL — ABNORMAL LOW (ref 150–400)
RBC: 3.68 MIL/uL — ABNORMAL LOW (ref 4.22–5.81)
RDW: 13.3 % (ref 11.5–15.5)
WBC: 8.4 10*3/uL (ref 4.0–10.5)
nRBC: 0 % (ref 0.0–0.2)

## 2023-07-10 LAB — LACTIC ACID, PLASMA
Lactic Acid, Venous: 1.5 mmol/L (ref 0.5–1.9)
Lactic Acid, Venous: 1.3 mmol/L (ref 0.5–1.9)

## 2023-07-10 LAB — HIV ANTIBODY (ROUTINE TESTING W REFLEX): HIV Screen 4th Generation wRfx: NONREACTIVE

## 2023-07-10 LAB — TROPONIN I (HIGH SENSITIVITY): Troponin I (High Sensitivity): 11 ng/L (ref <18)

## 2023-07-10 LAB — CBG MONITORING, ED: Glucose-Capillary: 85 mg/dL (ref 70–99)

## 2023-07-10 LAB — MRSA NEXT GEN BY PCR, NASAL: MRSA by PCR Next Gen: NOT DETECTED

## 2023-07-10 MED ORDER — SODIUM CHLORIDE 0.9 % IV BOLUS
1000.0000 mL | Freq: Once | INTRAVENOUS | Status: AC
  Administered 2023-07-10: 1000 mL via INTRAVENOUS

## 2023-07-10 MED ORDER — SODIUM CHLORIDE 0.9 % IV SOLN: INTRAVENOUS | Status: AC

## 2023-07-10 MED ORDER — MIDODRINE HCL 5 MG PO TABS
7.5000 mg | ORAL_TABLET | Freq: Three times a day (TID) | ORAL | Status: DC
  Administered 2023-07-11 – 2023-07-12 (×4): 7.5 mg via ORAL
  Filled 2023-07-10 (×4): qty 2

## 2023-07-10 MED ORDER — ENOXAPARIN SODIUM 40 MG/0.4ML IJ SOSY
40.0000 mg | PREFILLED_SYRINGE | INTRAMUSCULAR | Status: DC
  Administered 2023-07-10 – 2023-07-17 (×8): 40 mg via SUBCUTANEOUS
  Filled 2023-07-10 (×8): qty 0.4

## 2023-07-10 MED ORDER — SODIUM CHLORIDE 0.9% FLUSH
3.0000 mL | Freq: Two times a day (BID) | INTRAVENOUS | Status: DC
  Administered 2023-07-10 – 2023-07-18 (×14): 3 mL via INTRAVENOUS

## 2023-07-10 NOTE — H&P (Signed)
History and Physical    Patient: Corey Morrison ZOX:[id number] DOB: 03/17/1956 DOA: 07/10/2023 DOS: the patient was seen and examined on 07/10/2023 PCP: [facility]  Patient coming from: SNF  Chief Complaint:  Chief Complaint  Patient presents with   Weakness   HPI: Corey Morrison is a 67 y.o. male with medical history significant of atrial fibrillation, coronary disease, [name] disease, recurrent falls/orthostasis presenting with syncope.  History from patient as well as his POA [name] ([id number]).  Per report, patient with worsening episodes of fall and passing out over the past week or so.  This is been a longstanding issue.  No reports of chest pain or shortness of breath.  No reports of nausea or vomiting.  Overall appetite is stable.  No recent medication change including increasing of Sinemet for [name]'s.?  Decreased fluid intake. Presented to the ER afebrile, hemodynamically stable.  White count 8.4, hemoglobin 11.1, platelets 147, creatinine 0.96.  Troponin within normal limits.  Urinalysis not indicative of infection.  EKG normal sinus rhythm with bundle branch block.  CT head within normal limits.  Was otherwise tentatively discharged with patient having syncopal episode on the way to the car.  On recheck patient orthostatic positive in the ER. Review of Systems: As mentioned in the history of present illness. All other systems reviewed and are negative. Past Medical History:  Diagnosis Date   A-fib (HCC)    Anxiety disorder    Coronary artery disease    Hyperlipidemia    [name] disease    Vitamin B12 deficiency    Past Surgical History:  Procedure Laterality Date   CARDIAC CATHETERIZATION     Social History:  reports that he has quit smoking. He has never used smokeless tobacco. He reports that he does not drink alcohol and does not use drugs.  No Known Allergies  Family History  Problem Relation Age of Onset   Heart Problems Father     Prior  to Admission medications   Medication Sig Start Date End Date Taking? Authorizing Provider  acetaminophen (TYLENOL) 325 MG tablet Take 650 mg by mouth every 6 (six) hours. 0800/1400/2000    [provider]  aspirin 81 MG EC tablet Take 81 mg by mouth daily.    [provider]  BIOFREEZE 4 % GEL SMARTSIG:sparingly Topical 3 Times Daily 07/14/22   [provider]  capsicum (ZOSTRIX) 0.075 % topical cream Apply 1 Application topically 2 (two) times daily. (Apply to feet and thighs)    [provider]  carbidopa-levodopa (SINEMET IR) 25-100 MG tablet Take 2 tablets by mouth 3 (three) times daily. 0700/1200/1700 12/13/22   [provider]  carbidopa-levodopa (SINEMET IR) 25-100 MG tablet Take 1 tablet by mouth 3 (three) times daily. 1000/1400/2100 02/07/23 03/09/23  Arnetha Courser, MD  Cholecalciferol (VITAMIN D) 50 MCG (2000 UT) CAPS Take 2,000 Units by mouth daily.    [provider]  entacapone (COMTAN) 200 MG tablet Take 1 tablet (200 mg total) by mouth 6 (six) times daily. Patient taking differently: Take 200 mg by mouth 6 (six) times daily. 0200/0600/1000/1400/1800/2200 07/28/22 02/03/23  Delfino Lovett, MD  ezetimibe (ZETIA) 10 MG tablet Take 10 mg by mouth daily.    [provider]  fluticasone (FLONASE) 50 MCG/ACT nasal spray Place 1 spray into both nostrils daily.    [provider]  gabapentin (NEURONTIN) 100 MG capsule Take 2 capsules (200 mg total) by mouth 3 (three) times daily. Patient taking differently: Take 200 mg by  mouth 3 (three) times daily. 0800/1400/2000 07/28/22 02/03/23  Delfino Lovett, MD  guaifenesin (ROBITUSSIN) 100 MG/5ML syrup Take 300 mg by mouth every 6 (six) hours as needed for cough.    [provider]  loperamide (IMODIUM A-D) 2 MG tablet Take 4 mg by mouth as needed for diarrhea or loose stools.    [provider]  magnesium hydroxide (MILK OF MAGNESIA) 400 MG/5ML suspension Take 30 mLs by mouth  daily as needed for mild constipation.    [provider]  melatonin 5 MG TABS Take 1 tablet (5 mg total) by mouth at bedtime. 06/28/22   Pennie Banter, DO  midodrine (PROAMATINE) 2.5 MG tablet Take 7.5 mg by mouth 3 (three) times daily with meals. 0900/1200/1600 10/10/22   [provider]  omeprazole (PRILOSEC) 20 MG capsule Take 20 mg by mouth daily. 10/10/22   [provider]  polyethylene glycol (MIRALAX / GLYCOLAX) 17 g packet Take 17 g by mouth daily as needed for mild constipation. 06/28/22   Esaw Grandchild A, DO  psyllium (REGULOID) 0.52 g capsule Take 0.52 g by mouth at bedtime.    [provider]  risperiDONE (RISPERDAL) 0.5 MG tablet TAKE 1 TABLET BY MOUTH 2 TIMES PER DAY Patient taking differently: Take 1 mg by mouth in the morning, at noon, and at bedtime. 0800/1400/2000 07/28/22   Delfino Lovett, MD  sertraline (ZOLOFT) 100 MG tablet Take 1 tablet (100 mg total) by mouth daily. Patient taking differently: Take 150 mg by mouth daily. 07/28/22 02/03/23  Delfino Lovett, MD  vitamin B-12 (CYANOCOBALAMIN) 1000 MCG tablet Take 1,000 mcg by mouth daily.    [provider]    Physical Exam: Vitals:   07/10/23 1500 07/10/23 1530 07/10/23 1547 07/10/23 1547  BP: (!) 92/57 111/64    Pulse: 65 83  81  Resp: 13 14  13   Temp:    97.7 F (36.5 C)  TempSrc:    Oral  SpO2:   100% 100%  Weight:      Height:       Physical Exam Constitutional:      Comments: Underweight   HENT:     Head: Normocephalic.     Nose: Nose normal.     Mouth/Throat:     Mouth: Mucous membranes are dry.  Eyes:     Conjunctiva/sclera: Conjunctivae normal.  Cardiovascular:     Rate and Rhythm: Normal rate and regular rhythm.  Pulmonary:     Effort: Pulmonary effort is normal.  Abdominal:     General: Bowel sounds are normal.  Musculoskeletal:     Cervical back: Normal range of motion.     Comments: + generalized weakness    Skin:    General: Skin is warm.   Neurological:     Comments: + generalized weakness and dysarthria- appears near baseline per report       Data Reviewed:  There are no new results to review at this time.  CT Head Wo Contrast CLINICAL DATA:  Provided history: Neuro deficit, acute, stroke suspected. Additional history provided: Slurred speech, right arm drift/weakness. History of seizure.  EXAM: CT HEAD WITHOUT CONTRAST  TECHNIQUE: Contiguous axial images were obtained from the base of the skull through the vertex without intravenous contrast.  RADIATION DOSE REDUCTION: This exam was performed according to the departmental dose-optimization program which includes automated exposure control, adjustment of the mA and/or kV according to patient size and/or use of iterative reconstruction technique.  COMPARISON:  Head CT  06/10/2023.  FINDINGS: Mildly motion degraded exam. Within this limitation, findings are as follows.  Brain:  No age advanced or lobar predominant parenchymal atrophy.  Patchy and ill-defined hypoattenuation within the cerebral white matter, nonspecific but compatible with mild chronic small vessel ischemic disease.  There is no acute intracranial hemorrhage.  No demarcated cortical infarct.  No extra-axial fluid collection.  No evidence of an intracranial mass.  No midline shift.  Vascular: No hyperdense vessel.  Atherosclerotic calcifications.  Skull: No calvarial fracture or aggressive osseous lesion.  Sinuses/Orbits: No mass or acute finding within the imaged orbits. Small-volume frothy secretions within the right sphenoid sinus. Minimal mucosal thickening within the right maxillary sinus at the imaged levels.  IMPRESSION: 1. Mildly motion degraded exam. 2.  No evidence of an acute intracranial abnormality. 3. Mild chronic small vessel ischemic changes within the cerebral white matter. 4. Paranasal sinus disease at the imaged levels, as described.  Electronically  Signed   By: Jackey Loge D.O.   On: 07/10/2023 10:55  Lab Results  Component Value Date   WBC 8.4 07/10/2023   HGB 11.1 (L) 07/10/2023   HCT 34.4 (L) 07/10/2023   MCV 93.5 07/10/2023   PLT 147 (L) 07/10/2023   Last metabolic panel Lab Results  Component Value Date   GLUCOSE 101 (H) 07/10/2023   NA 141 07/10/2023   K 3.9 07/10/2023   CL 105 07/10/2023   CO2 27 07/10/2023   BUN 33 (H) 07/10/2023   CREATININE 0.96 07/10/2023   GFRNONAA >60 07/10/2023   CALCIUM 8.8 (L) 07/10/2023   PROT 7.2 06/10/2023   ALBUMIN 4.1 06/10/2023   BILITOT 0.7 06/10/2023   ALKPHOS 59 06/10/2023   AST 10 (L) 06/10/2023   ALT 6 06/10/2023   ANIONGAP 9 07/10/2023    Assessment and Plan: * Postural dizziness with presyncope Acute flare Clinically dry IV fluid hydration Monitor  Syncope Recurrent syncope in the setting of [name]'s disease, orthostatic hypotension, gait instability Orthostatic positive today Mildry dry Will gently hydrate patient Titrate home regimen  Weakness Acute on chronic issue in the setting of syncope, [name]'s Gentle IV fluid hydration PT OT evaluation Fall precautions   CAD (coronary artery disease) No active CP  Cont home regimen    GERD without esophagitis PPI   Greater than 50% was spent in counseling and coordination of care with patient Total encounter time 80 minutes or more     Advance Care Planning:   Code Status: DNR   Consults: None   Family Communication: No family at the bedside   Severity of Illness: The appropriate patient status for this patient is OBSERVATION. Observation status is judged to be reasonable and necessary in order to provide the required intensity of service to ensure the patient's safety. The patient's presenting symptoms, physical exam findings, and initial radiographic and laboratory data in the context of their medical condition is felt to place them at decreased risk for further clinical deterioration.  Furthermore, it is anticipated that the patient will be medically stable for discharge from the hospital within 2 midnights of admission.   Author: Floydene Flock, MD 07/10/2023 3:57 PM  For on call review [email or web address].

## 2023-07-10 NOTE — Assessment & Plan Note (Signed)
Acute flare Clinically dry IV fluid hydration Monitor

## 2023-07-10 NOTE — ED Notes (Signed)
MD Siadecki updated POA who said he will come shortly to pick pt up. [facility] updated.

## 2023-07-10 NOTE — ED Triage Notes (Signed)
Pt from [facility] [place] via EMS. Called in for stroke concerned I.e. slurred speech and right arm drift and weakness. Fire and EMS tested LVO 0 and bilateral weakness. Pt has hx of seizure hx and has been compliant with meds per staff. EMS vitals: Cbg 103 BP 124/73 98 RA 65  HR

## 2023-07-10 NOTE — ED Provider Notes (Signed)
[facility] Provider Note    Event Date/Time   First MD Initiated Contact with Patient 07/10/23 [phone]     (approximate)   History   Weakness   HPI  Corey Morrison is a 67 y.o. male with history of [name]'s, CAD, and atrial fibrillation not on anticoagulation who presents with generalized weakness and speech disturbance.  Per EMS there was an initial concern from the patient facility that he may be having a stroke as he had right arm weakness and some drift in addition to the speech disturbance.  EMS, however, noted an LVO score of 0 and did not find any focal symptoms.  They noted generalized weakness.  The patient himself cannot give much history but denies any pain.  He states he feels somewhat weaker than usual.  He does not feel dizzy or short of breath.  I have reviewed the past medical records.  The patient was most recently admitted to the hospitalist service in March for generalized weakness, multiple falls, and hypotension   Physical Exam   Triage Vital Signs: ED Triage Vitals  Encounter Vitals Group     BP --      Systolic BP Percentile --      Diastolic BP Percentile --      Pulse --      Resp --      Temp 07/10/23 1002 (!) 97.3 F (36.3 C)     Temp Source 07/10/23 1002 Axillary     SpO2 --      Weight 07/10/23 0959 154 lb 5.2 oz (70 kg)     Height 07/10/23 0959 5\' 9"  (1.753 m)     Head Circumference --      Peak Flow --      Pain Score 07/10/23 0959 0     Pain Loc --      Pain Education --      Exclude from Growth Chart --     Most recent vital signs: Vitals:   07/10/23 1445 07/10/23 1500  BP: (!) 88/70 (!) 92/57  Pulse: 74 65  Resp:  13  Temp:    SpO2:       General: Alert, comfortable appearing, no distress.  CV:  Good peripheral perfusion.  Resp:  Normal effort.  Abd:  Soft and nontender.  No distention.  Other:  EOMI.  PERRLA.  No facial droop or asymmetry.  Mildly dysarthric speech.  5/5 motor strength and intact  sensation of bilateral upper extremities.  3/5 motor strength and intact sensation to bilateral lower extremities.   ED Results / Procedures / Treatments   Labs (all labs ordered are listed, but only abnormal results are displayed) Labs Reviewed  BASIC METABOLIC PANEL - Abnormal; Notable for the following components:      Result Value   Glucose, Bld 101 (*)    BUN 33 (*)    Calcium 8.8 (*)    All other components within normal limits  CBC - Abnormal; Notable for the following components:   RBC 3.68 (*)    Hemoglobin 11.1 (*)    HCT 34.4 (*)    Platelets 147 (*)    All other components within normal limits  URINALYSIS, ROUTINE W REFLEX MICROSCOPIC - Abnormal; Notable for the following components:   Color, Urine AMBER (*)    APPearance CLEAR (*)    Ketones, ur 5 (*)    All other components within normal limits  SARS CORONAVIRUS 2 BY RT PCR  LACTIC  ACID, PLASMA  LACTIC ACID, PLASMA  CBG MONITORING, ED  TROPONIN I (HIGH SENSITIVITY)     EKG  ED ECG REPORT I, Dionne Bucy, the attending physician, personally viewed and interpreted this ECG.  Date: 07/10/2023 EKG Time: 1003 Rate: 58 Rhythm: normal sinus rhythm QRS Axis: normal Intervals: LBBB ST/T Wave abnormalities: normal Narrative Interpretation: no evidence of acute ischemia; no significant change when compared to EKG of 06/10/2023    RADIOLOGY  CT head: I independently viewed and interpreted the images; there is no ICH.  Radiology report indicates no acute abnormalities.  PROCEDURES:  Critical Care performed: No  Procedures   MEDICATIONS ORDERED IN ED: Medications  sodium chloride 0.9 % bolus 1,000 mL (1,000 mLs Intravenous New Bag/Given 07/10/23 1458)     IMPRESSION / MDM / ASSESSMENT AND PLAN / ED COURSE  I reviewed the triage vital signs and the nursing notes.  67 year old male with PMH as noted above presents with generalized weakness and dysarthria noted today although the exact time of  onset is unclear.  There was some concern for possible focal deficit on the right side but this was not found by EMS and does not present on exam currently.  Additional history was provided by the patient's POA who knows him well and states he has had episodes like this previously especially when he has had problems with low blood pressure.  He states that this episode is slightly worse but he has seen the patient like this before.  Differential diagnosis includes, but is not limited to, [name]'s, dehydration, electrolyte abnormality, other metabolic cause, COVID, UTI, or other infection, less likely cardiac etiology.  I have a lower suspicion for acute stroke or other acute CNS cause.  There is no indication for code stroke activation given the lack of any focal neurologic deficits on exam and the unknown onset of symptoms.  We will obtain CT head, lab workup, and reassess.  Patient's presentation is most consistent with acute complicated illness / injury requiring diagnostic workup.  The patient is on the cardiac monitor to evaluate for evidence of arrhythmia and/or significant heart rate changes.  ----------------------------------------- 3:21 PM on 07/10/2023 -----------------------------------------  Lab workup is overall unremarkable.  Urinalysis shows no acute findings.  Electrolytes are normal.  There is no leukocytosis or significant anemia.  COVID is negative.  Troponin is negative.    CT shows no acute findings.  On reassessment the patient stated he still feels somewhat weak but denied other focal symptoms.  I discussed the plan with him as well as with his friend and POA Rick, both of whom agreed with discharge back to his facility.  However, when he was being discharged the patient became near syncopal on standing.  He was brought back to the room and the repeat blood pressure is low.  Overall I suspect orthostatic hypotension.  However, the patient will need fluids and further  management.  I consulted Dr. Alvester Morin from the hospitalist service; based on our discussion he agrees to evaluate the patient for admission.   FINAL CLINICAL IMPRESSION(S) / ED DIAGNOSES   Final diagnoses:  Generalized weakness  Orthostatic hypotension     Rx / DC Orders   ED Discharge Orders     None        Note:  This document was prepared using Dragon voice recognition software and may include unintentional dictation errors.    Dionne Bucy, MD 07/10/23 [phone]

## 2023-07-10 NOTE — ED Notes (Signed)
Patient being transported up to inpatient room at this time by ED NT.

## 2023-07-10 NOTE — ED Triage Notes (Signed)
SNF staff reported pt is DNR but lost the form during last ER trip

## 2023-07-10 NOTE — Plan of Care (Signed)

## 2023-07-10 NOTE — ED Notes (Signed)
This RN in&out pt per MD for urine collection. 400 CC drained. Pt given sandwich tray and coke.

## 2023-07-10 NOTE — Assessment & Plan Note (Signed)
Acute on chronic issue in the setting of syncope, [name]'s Gentle IV fluid hydration PT OT evaluation Fall precautions

## 2023-07-10 NOTE — ED Notes (Signed)
This RN and POA Richard assisted pt from stretcher to wheelchair, pt tolerated well. When attempting to transfer from wheelchair to car, pt had syncopal episode, unable to stand, pale, eyes unfocused, but able to follow commands. When back in room pt's color came back, BP 88/70 and was back to baseline. MD notified.

## 2023-07-10 NOTE — Assessment & Plan Note (Signed)
No active CP  Cont home regimen   

## 2023-07-10 NOTE — Discharge Instructions (Signed)
Your CT scan is negative and there are no signs of a stroke, UTI, COVID, or other infection.  Continue taking your normal medications as prescribed.  Return to the ER for new, worsening or persistent severe weakness, numbness, difficulty speaking, vision changes, fever, vomiting, shortness of breath, or any other new or worsening symptoms that concern you.

## 2023-07-10 NOTE — Assessment & Plan Note (Addendum)
Recurrent syncope in the setting of [name]'s disease, orthostatic hypotension, gait instability Orthostatic positive today Mildry dry ?sinemet increased dosing may be confounding issue Will gently hydrate patient Titrate medication regimen

## 2023-07-10 NOTE — ED Notes (Signed)
[facility] updated about admission. POA also aware

## 2023-07-10 NOTE — Assessment & Plan Note (Signed)
PPI ?

## 2023-07-11 DIAGNOSIS — R262 Difficulty in walking, not elsewhere classified: Secondary | ICD-10-CM | POA: Diagnosis present

## 2023-07-11 DIAGNOSIS — D696 Thrombocytopenia, unspecified: Secondary | ICD-10-CM | POA: Diagnosis present

## 2023-07-11 DIAGNOSIS — F419 Anxiety disorder, unspecified: Secondary | ICD-10-CM | POA: Diagnosis present

## 2023-07-11 DIAGNOSIS — K219 Gastro-esophageal reflux disease without esophagitis: Secondary | ICD-10-CM | POA: Diagnosis present

## 2023-07-11 DIAGNOSIS — R531 Weakness: Secondary | ICD-10-CM | POA: Diagnosis not present

## 2023-07-11 DIAGNOSIS — Z87891 Personal history of nicotine dependence: Secondary | ICD-10-CM | POA: Diagnosis not present

## 2023-07-11 DIAGNOSIS — G20B2 Parkinson's disease with dyskinesia, with fluctuations: Secondary | ICD-10-CM | POA: Diagnosis not present

## 2023-07-11 DIAGNOSIS — G4700 Insomnia, unspecified: Secondary | ICD-10-CM | POA: Diagnosis present

## 2023-07-11 DIAGNOSIS — Z7982 Long term (current) use of aspirin: Secondary | ICD-10-CM | POA: Diagnosis not present

## 2023-07-11 DIAGNOSIS — R42 Dizziness and giddiness: Secondary | ICD-10-CM | POA: Diagnosis not present

## 2023-07-11 DIAGNOSIS — R5381 Other malaise: Secondary | ICD-10-CM | POA: Diagnosis present

## 2023-07-11 DIAGNOSIS — Z79899 Other long term (current) drug therapy: Secondary | ICD-10-CM | POA: Diagnosis not present

## 2023-07-11 DIAGNOSIS — D649 Anemia, unspecified: Secondary | ICD-10-CM | POA: Diagnosis present

## 2023-07-11 DIAGNOSIS — I2510 Atherosclerotic heart disease of native coronary artery without angina pectoris: Secondary | ICD-10-CM | POA: Diagnosis present

## 2023-07-11 DIAGNOSIS — R451 Restlessness and agitation: Secondary | ICD-10-CM | POA: Diagnosis not present

## 2023-07-11 DIAGNOSIS — Z66 Do not resuscitate: Secondary | ICD-10-CM | POA: Diagnosis present

## 2023-07-11 DIAGNOSIS — I454 Nonspecific intraventricular block: Secondary | ICD-10-CM | POA: Diagnosis present

## 2023-07-11 DIAGNOSIS — R55 Syncope and collapse: Secondary | ICD-10-CM

## 2023-07-11 DIAGNOSIS — E785 Hyperlipidemia, unspecified: Secondary | ICD-10-CM | POA: Diagnosis present

## 2023-07-11 DIAGNOSIS — I480 Paroxysmal atrial fibrillation: Secondary | ICD-10-CM | POA: Diagnosis present

## 2023-07-11 DIAGNOSIS — I951 Orthostatic hypotension: Secondary | ICD-10-CM | POA: Diagnosis present

## 2023-07-11 DIAGNOSIS — G20A1 Parkinson's disease without dyskinesia, without mention of fluctuations: Secondary | ICD-10-CM | POA: Diagnosis present

## 2023-07-11 DIAGNOSIS — D6869 Other thrombophilia: Secondary | ICD-10-CM | POA: Diagnosis present

## 2023-07-11 DIAGNOSIS — Z1152 Encounter for screening for COVID-19: Secondary | ICD-10-CM | POA: Diagnosis not present

## 2023-07-11 LAB — GLUCOSE, CAPILLARY: Glucose-Capillary: 86 mg/dL (ref 70–99)

## 2023-07-11 MED ORDER — MAGNESIUM HYDROXIDE 400 MG/5ML PO SUSP
15.0000 mL | Freq: Every day | ORAL | Status: DC | PRN
  Administered 2023-07-11: 15 mL via ORAL
  Filled 2023-07-11: qty 30

## 2023-07-11 MED ORDER — CARBIDOPA-LEVODOPA 25-100 MG PO TABS
2.0000 | ORAL_TABLET | Freq: Every day | ORAL | Status: DC
  Administered 2023-07-11 – 2023-07-18 (×35): 2 via ORAL
  Filled 2023-07-11 (×35): qty 2

## 2023-07-11 MED ORDER — CARBIDOPA-LEVODOPA 25-100 MG PO TABS
3.0000 | ORAL_TABLET | Freq: Every day | ORAL | Status: DC
  Administered 2023-07-12 – 2023-07-18 (×7): 3 via ORAL
  Filled 2023-07-11 (×7): qty 3

## 2023-07-11 MED ORDER — ASPIRIN 81 MG PO TBEC
81.0000 mg | DELAYED_RELEASE_TABLET | Freq: Every day | ORAL | Status: DC
  Administered 2023-07-11 – 2023-07-18 (×8): 81 mg via ORAL
  Filled 2023-07-11 (×8): qty 1

## 2023-07-11 MED ORDER — MAGNESIUM HYDROXIDE 400 MG/5ML PO SUSP: 30.0000 mL | Freq: Every day | ORAL | Status: DC | PRN

## 2023-07-11 MED ORDER — EZETIMIBE 10 MG PO TABS
10.0000 mg | ORAL_TABLET | Freq: Every day | ORAL | Status: DC
  Administered 2023-07-11 – 2023-07-18 (×8): 10 mg via ORAL
  Filled 2023-07-11 (×8): qty 1

## 2023-07-11 NOTE — Progress Notes (Signed)
PT Cancellation Note  Patient Details Name: Corey Morrison MRN: [id number] DOB: 01-07-1956   Cancelled Treatment:    Reason Eval/Treat Not Completed: Patient not medically ready. Spoke with OT regarding Pt status and deemed Pt is not medically ready for PT evaluation at this time due to orthostatic symptoms in standing. Will re-attempt PT eval after lunch.   Avryl Roehm 07/11/2023, 9:54 AM

## 2023-07-11 NOTE — Evaluation (Addendum)
Occupational Therapy Evaluation Patient Details Name: Corey Morrison MRN: [id number] DOB: 1956/05/23 Today's Date: 07/11/2023   History of Present Illness Pt is a 67 y.o. male with history of [name]'s disease, paroxysmal atrial fibrillation, CAD, and recurrent falls/orthostasis presenting with syncope.   Clinical Impression   Pt was seen for OT evaluation this date. Prior to hospital admission, pt reports living at an ALF, was ambulating with AD (unclear what type), indep with dressing and toileting, and recently requiring assist for showering from staff. Pt presents to acute OT demonstrating impaired ADL performance and functional mobility 2/2 impaired cognition (actively hallucinating), general weakness, impaired balance, and significantly orthostatic with positional changes (See OT problem list and Vitals section for additional functional deficits). Supine BP 155/78 HR 81, sitting BP 1113/70 HR 86 (started to feel a little dizzy), standing BP 76/44 HR 89 and increasingly dizzy to the point we had to return to bed and unable to attempt getting to the recliner. Pt currently requires MOD A for bed mobility, MIN A for ADL transfers, and anticipate need for MOD A for toileting and LB dressing. Care team notified of progress, BPs, and hallucinations. Pt would benefit from skilled OT services to address noted impairments and functional limitations (see below for any additional details) in order to maximize safety and independence while minimizing falls risk and caregiver burden.     If plan is discharge home, recommend the following: A lot of help with walking and/or transfers;A lot of help with bathing/dressing/bathroom;Assistance with cooking/housework;Assist for transportation;Help with stairs or ramp for entrance;Direct supervision/assist for medications management;Direct supervision/assist for financial management    Functional Status Assessment  Patient has had a recent decline in their functional  status and demonstrates the ability to make significant improvements in function in a reasonable and predictable amount of time.  Equipment Recommendations  Other (comment) (defer to next venue)    Recommendations for Other Services       Precautions / Restrictions Precautions Precautions: Fall Precaution Comments: monitor orthostatics Restrictions Weight Bearing Restrictions: No      Mobility Bed Mobility Overal bed mobility: Needs Assistance Bed Mobility: Supine to Sit, Sit to Supine     Supine to sit: Mod assist, HOB elevated, Used rails Sit to supine: Mod assist   General bed mobility comments: VC for sequencing    Transfers Overall transfer level: Needs assistance Equipment used: Rolling walker (2 wheels) Transfers: Sit to/from Stand Sit to Stand: Min assist           General transfer comment: STS x2, VC for hand placement      Balance Overall balance assessment: Needs assistance Sitting-balance support: Single extremity supported, Bilateral upper extremity supported, Feet supported Sitting balance-Leahy Scale: Fair     Standing balance support: Bilateral upper extremity supported, Reliant on assistive device for balance Standing balance-Leahy Scale: Poor                             ADL either performed or assessed with clinical judgement   ADL                                         General ADL Comments: Anticipate pt requires MOD A for LB ADL tasks, MIN A For seated UB ADL, and requires MIN A for ADL transfers.     Vision  Perception         Praxis         Pertinent Vitals/Pain Pain Assessment Pain Assessment: No/denies pain     Extremity/Trunk Assessment Upper Extremity Assessment Upper Extremity Assessment: Generalized weakness   Lower Extremity Assessment Lower Extremity Assessment: Generalized weakness       Communication     Cognition Arousal: Alert Behavior During Therapy: Flat  affect Overall Cognitive Status: No family/caregiver present to determine baseline cognitive functioning                                 General Comments: Pt oriented to self and situation, follows simple commands with increased time and repeated VC, endorses hallucinations (seeing people and dogs) that he reports happens about every other day     General Comments  Supine BP 155/78 HR 81, sitting BP 1113/70 HR 86 (started to feel a little dizzy), standing BP 76/44 HR 89 and increasingly dizzy to the point we had to return to bed    Exercises Other Exercises Other Exercises: Pt educated in ADL transfers with RW to minimize posterior LOB   Shoulder Instructions      Home Living Family/patient expects to be discharged to:: Assisted living                                 Additional Comments: Home equipment obstained from prior chart review, difficult to obtain and verify PLOF/home setup info beyond ALF      Prior Functioning/Environment Prior Level of Function : Patient poor historian/Family not available;Needs assist             Mobility Comments: Pt reports using SPC versus RW but has difficulty clarifying which ADLs Comments: Pt reports indep with dressing and toileting but recently requiring assist for showering, ALF assists with IADL        OT Problem List: Decreased strength;Cardiopulmonary status limiting activity;Decreased safety awareness;Decreased cognition;Impaired balance (sitting and/or standing);Decreased knowledge of use of DME or AE      OT Treatment/Interventions: Self-care/ADL training;Therapeutic activities;Therapeutic exercise;Cognitive remediation/compensation;DME and/or AE instruction;Balance training;Patient/family education    OT Goals(Current goals can be found in the care plan section) Acute Rehab OT Goals Patient Stated Goal: go back to ALF OT Goal Formulation: With patient Time For Goal Achievement: 07/25/23 Potential to  Achieve Goals: Good ADL Goals Pt Will Perform Lower Body Dressing: sit to/from stand;with min assist Pt Will Transfer to Toilet: with contact guard assist;ambulating;bedside commode (LRAD, no dizziness) Pt Will Perform Toileting - Clothing Manipulation and hygiene: with modified independence;sitting/lateral leans Additional ADL Goal #1: Pt will verbalize plan to implement at least 1 learned falls prevention strategy to maximize safety.  OT Frequency: Min 1X/week    Co-evaluation              AM-PAC OT "6 Clicks" Daily Activity     Outcome Measure Help from another person eating meals?: None Help from another person taking care of personal grooming?: A Little Help from another person toileting, which includes using toliet, bedpan, or urinal?: A Lot Help from another person bathing (including washing, rinsing, drying)?: A Lot Help from another person to put on and taking off regular upper body clothing?: A Little Help from another person to put on and taking off regular lower body clothing?: A Lot 6 Click Score: 16   End of Session Equipment  Utilized During Treatment: Rolling walker (2 wheels) Nurse Communication: Mobility status;Other (comment) (BPs, hallucinations; message sent to entire care team)  Activity Tolerance: Treatment limited secondary to medical complications (Comment) (dizzy, orthostatic) Patient left: in bed;with call bell/phone within reach;with bed alarm set  OT Visit Diagnosis: Other abnormalities of gait and mobility (R26.89);History of falling (Z91.81);Muscle weakness (generalized) (M62.81)                Time: 2725-3664 OT Time Calculation (min): 26 min Charges:  OT General Charges $OT Visit: 1 Visit OT Evaluation $OT Eval Moderate Complexity: 1 Mod  Arman Filter., MPH, MS, OTR/L ascom [phone] 07/11/23, 1:43 PM

## 2023-07-11 NOTE — Plan of Care (Signed)

## 2023-07-11 NOTE — Progress Notes (Signed)
PROGRESS NOTE    Corey Morrison  JJO:[id number] DOB: 05-15-1956 DOA: 07/10/2023 PCP: [facility]   Assessment & Plan:   Principal Problem:   Postural dizziness with presyncope Active Problems:   Syncope   Weakness   CAD (coronary artery disease)   GERD without esophagitis  Assessment and Plan: Postural dizziness: continue on IVFs and home dose of midodrine. Likely secondary to orthostatic hypotension in setting of Parkinsons   Recurrent syncope: likely secondary to orthostatic hypotension in the setting of Parkinsons. Continue on IVFs. Continue on hoe midodrine    Weakness: PT/OT consulted. Likely secondary to Parkinsons. Fall precautions  Parkinsons: restart home dose of sinemet   Normocytic anemia: H&H are trending down. No need for a transfusion currently.   Hx of CAD: restart aspririn, zetia.    GERD: continue on PPI      DVT prophylaxis: lovenox Code Status: DNR Family Communication: discussed pt's care w/ pt's POA, Rick, and answered his questions  Disposition Plan: depends on PT/OT recs. Lives at ALF as per pt's POA  Level of care: Telemetry Medical  Status is: Inpatient Remains inpatient appropriate because: severity of illness, still orthostatic       Consultants:    Procedures:   Antimicrobials:   Subjective: Pt is oriented to self and place only.   Objective: Vitals:   07/10/23 2352 07/11/23 0000 07/11/23 0500 07/11/23 0722  BP: (!) 168/87   (!) 153/82  Pulse: 89   78  Resp: 18   17  Temp: 97.8 F (36.6 C)   99.6 F (37.6 C)  TempSrc:      SpO2: (!) 78% 94%  96%  Weight:   74.6 kg   Height:        Intake/Output Summary (Last 24 hours) at 07/11/2023 0815 Last data filed at 07/10/2023 1900 Gross per 24 hour  Intake 1001.25 ml  Output 100 ml  Net 901.25 ml   Filed Weights   07/10/23 0959 07/11/23 0500  Weight: 70 kg 74.6 kg    Examination:  General exam: Appears calm and comfortable  Respiratory system: clear  breath sounds b/l  Cardiovascular system: S1 & S2+. No rubs, gallops or clicks.  Gastrointestinal system: Abdomen is nondistended, soft and nontender. Normal bowel sounds heard. Central nervous system: Alert and oriented x self & place only. Slow to respond  Psychiatry: Judgement and insight appears at baseline. Flat mood and affect.     Data Reviewed: I have personally reviewed following labs and imaging studies  CBC: Recent Labs  Lab 07/10/23 1004 07/11/23 0443  WBC 8.4 8.1  HGB 11.1* 10.6*  HCT 34.4* 31.7*  MCV 93.5 90.1  PLT 147* 154   Basic Metabolic Panel: Recent Labs  Lab 07/10/23 1004 07/11/23 0443  NA 141 143  K 3.9 4.1  CL 105 111  CO2 27 27  GLUCOSE 101* 93  BUN 33* 28*  CREATININE 0.96 0.86  CALCIUM 8.8* 8.6*   GFR: Estimated Creatinine Clearance: 83.4 mL/min (by C-G formula based on SCr of 0.86 mg/dL). Liver Function Tests: Recent Labs  Lab 07/11/23 0443  AST 14*  ALT 9  ALKPHOS 58  BILITOT 0.4  PROT 6.5  ALBUMIN 3.5   No results for input(s): "LIPASE", "AMYLASE" in the last 168 hours. No results for input(s): "AMMONIA" in the last 168 hours. Coagulation Profile: No results for input(s): "INR", "PROTIME" in the last 168 hours. Cardiac Enzymes: No results for input(s): "CKTOTAL", "CKMB", "CKMBINDEX", "TROPONINI" in the last 168 hours.  BNP (last 3 results) No results for input(s): "PROBNP" in the last 8760 hours. HbA1C: No results for input(s): "HGBA1C" in the last 72 hours. CBG: Recent Labs  Lab 07/10/23 1214 07/11/23 0500  GLUCAP 85 86   Lipid Profile: No results for input(s): "CHOL", "HDL", "LDLCALC", "TRIG", "CHOLHDL", "LDLDIRECT" in the last 72 hours. Thyroid Function Tests: No results for input(s): "TSH", "T4TOTAL", "FREET4", "T3FREE", "THYROIDAB" in the last 72 hours. Anemia Panel: No results for input(s): "VITAMINB12", "FOLATE", "FERRITIN", "TIBC", "IRON", "RETICCTPCT" in the last 72 hours. Sepsis Labs: Recent Labs  Lab  07/10/23 1556 07/10/23 1852  LATICACIDVEN 1.5 1.3    Recent Results (from the past 240 hour(s))  SARS Coronavirus 2 by RT PCR (hospital order, performed in [facility] hospital lab) *cepheid single result test* Anterior Nasal Swab     Status: None   Collection Time: 07/10/23 10:44 AM   Specimen: Anterior Nasal Swab  Result Value Ref Range Status   SARS Coronavirus 2 by RT PCR NEGATIVE NEGATIVE Final    Comment: (NOTE) SARS-CoV-2 target nucleic acids are NOT DETECTED.  The SARS-CoV-2 RNA is generally detectable in upper and lower respiratory specimens during the acute phase of infection. The lowest concentration of SARS-CoV-2 viral copies this assay can detect is 250 copies / mL. A negative result does not preclude SARS-CoV-2 infection and should not be used as the sole basis for treatment or other patient management decisions.  A negative result may occur with improper specimen collection / handling, submission of specimen other than nasopharyngeal swab, presence of viral mutation(s) within the areas targeted by this assay, and inadequate number of viral copies (<250 copies / mL). A negative result must be combined with clinical observations, patient history, and epidemiological information.  Fact Sheet for Patients:   RoadLapTop.co.za  Fact Sheet for Healthcare Providers: http://kim-Salasar.com/  This test is not yet approved or  cleared by the [place] FDA and has been authorized for detection and/or diagnosis of SARS-CoV-2 by FDA under an Emergency Use Authorization (EUA).  This EUA will remain in effect (meaning this test can be used) for the duration of the COVID-19 declaration under Section 564(b)(1) of the Act, 21 U.S.C. section 360bbb-3(b)(1), unless the authorization is terminated or revoked sooner.  Performed at [facility], [address] [place], [place] [address]   MRSA Next Gen by PCR, Nasal      Status: None   Collection Time: 07/10/23  5:49 PM   Specimen: Nasal Mucosa; Nasal Swab  Result Value Ref Range Status   MRSA by PCR Next Gen NOT DETECTED NOT DETECTED Final    Comment: (NOTE) The GeneXpert MRSA Assay (FDA approved for NASAL specimens only), is one component of a comprehensive MRSA colonization surveillance program. It is not intended to diagnose MRSA infection nor to guide or monitor treatment for MRSA infections. Test performance is not FDA approved in patients less than 34 years old. Performed at [facility], [address], [place], [place] [address]          Radiology Studies: CT Head Wo Contrast  Result Date: 07/10/2023 CLINICAL DATA:  Provided history: Neuro deficit, acute, stroke suspected. Additional history provided: Slurred speech, right arm drift/weakness. History of seizure. EXAM: CT HEAD WITHOUT CONTRAST TECHNIQUE: Contiguous axial images were obtained from the base of the skull through the vertex without intravenous contrast. RADIATION DOSE REDUCTION: This exam was performed according to the departmental dose-optimization program which includes automated exposure control, adjustment of the mA and/or kV  according to patient size and/or use of iterative reconstruction technique. COMPARISON:  Head CT 06/10/2023. FINDINGS: Mildly motion degraded exam. Within this limitation, findings are as follows. Brain: No age advanced or lobar predominant parenchymal atrophy. Patchy and ill-defined hypoattenuation within the cerebral white matter, nonspecific but compatible with mild chronic small vessel ischemic disease. There is no acute intracranial hemorrhage. No demarcated cortical infarct. No extra-axial fluid collection. No evidence of an intracranial mass. No midline shift. Vascular: No hyperdense vessel.  Atherosclerotic calcifications. Skull: No calvarial fracture or aggressive osseous lesion. Sinuses/Orbits: No mass or acute finding within the imaged orbits.  Small-volume frothy secretions within the right sphenoid sinus. Minimal mucosal thickening within the right maxillary sinus at the imaged levels. IMPRESSION: 1. Mildly motion degraded exam. 2.  No evidence of an acute intracranial abnormality. 3. Mild chronic small vessel ischemic changes within the cerebral white matter. 4. Paranasal sinus disease at the imaged levels, as described. Electronically Signed   By: Jackey Loge D.O.   On: 07/10/2023 10:55        Scheduled Meds:  enoxaparin (LOVENOX) injection  40 mg Subcutaneous Q24H   midodrine  7.5 mg Oral TID WC   sodium chloride flush  3 mL Intravenous Q12H   Continuous Infusions:  sodium chloride 75 mL/hr at 07/11/23 0807     LOS: 0 days    Time spent: 35 mins     Charise Killian, MD [facility] Pager [phone]  If 7PM-7AM, please contact night-coverage [email or web address] 07/11/2023, 8:15 AM

## 2023-07-12 DIAGNOSIS — G20B2 Parkinson's disease with dyskinesia, with fluctuations: Secondary | ICD-10-CM | POA: Diagnosis not present

## 2023-07-12 DIAGNOSIS — R55 Syncope and collapse: Secondary | ICD-10-CM | POA: Diagnosis not present

## 2023-07-12 LAB — GLUCOSE, CAPILLARY
Glucose-Capillary: 83 mg/dL (ref 70–99)
Glucose-Capillary: 104 mg/dL — ABNORMAL HIGH (ref 70–99)
Glucose-Capillary: 93 mg/dL (ref 70–99)
Glucose-Capillary: 107 mg/dL — ABNORMAL HIGH (ref 70–99)
Glucose-Capillary: 100 mg/dL — ABNORMAL HIGH (ref 70–99)
Glucose-Capillary: 86 mg/dL (ref 70–99)

## 2023-07-12 MED ORDER — MIDODRINE HCL 5 MG PO TABS
10.0000 mg | ORAL_TABLET | Freq: Three times a day (TID) | ORAL | Status: DC
  Administered 2023-07-12 – 2023-07-18 (×19): 10 mg via ORAL
  Filled 2023-07-12 (×19): qty 2

## 2023-07-12 MED ORDER — PANTOPRAZOLE SODIUM 20 MG PO TBEC
20.0000 mg | DELAYED_RELEASE_TABLET | Freq: Every day | ORAL | Status: DC
  Administered 2023-07-12 – 2023-07-18 (×7): 20 mg via ORAL
  Filled 2023-07-12 (×7): qty 1

## 2023-07-12 NOTE — Evaluation (Signed)
Physical Therapy Evaluation Patient Details Name: Corey Morrison MRN: [id number] DOB: 01/21/1956 Today's Date: 07/12/2023  History of Present Illness  Pt is a 67 y.o. male with history of [name]'s disease, paroxysmal atrial fibrillation, CAD, and recurrent falls/orthostasis presenting with syncope.   Clinical Impression  Pt alert and oriented to self and hospital (when presented with options). Did complain of low blood sugar symptoms, assessed by CNA (96). The patient reported living at a facility but providing conflicting information about PLOF but endorsed using a WC lately and that the facility assists with IADLs and "sometimes" helps him up.   BP monitored throughout for orthostatic hypotension and pt was positive.  Noted for near syncopal event after standing ~3 minutes. Pt also with BM in standing, pericare provided as able. He required maxA for bed mobility including extra time for cues/sequencing, and minAx2 with RW stabilized to stand. Unable to safely ambulate due to low BP (see vitals section for details but 63/31). Pt maxA to supine and placed in trendelenburg, RN in room to assess pt.  Overall the patient demonstrated deficits (see "PT Problem List") that impede the patient's functional abilities, safety, and mobility and would benefit from skilled PT intervention. Recommendation is to continue skilled PT intervention to maximize pt safety and function.        If plan is discharge home, recommend the following: Two people to help with walking and/or transfers;Two people to help with bathing/dressing/bathroom;Direct supervision/assist for medications management;Help with stairs or ramp for entrance;Assist for transportation;Assistance with cooking/housework;Supervision due to cognitive status   Can travel by private vehicle   No    Equipment Recommendations None recommended by PT  Recommendations for Other Services       Functional Status Assessment Patient has had a recent  decline in their functional status and demonstrates the ability to make significant improvements in function in a reasonable and predictable amount of time.     Precautions / Restrictions Precautions Precautions: Fall Precaution Comments: monitor orthostatics Restrictions Weight Bearing Restrictions: No      Mobility  Bed Mobility Overal bed mobility: Needs Assistance Bed Mobility: Supine to Sit, Sit to Supine     Supine to sit: Max assist Sit to supine: Max assist   General bed mobility comments: maxA to returnto supine due to low BP. pt needed signifcant cueing and ultimately maxA to transition to EOB    Transfers Overall transfer level: Needs assistance Equipment used: Rolling walker (2 wheels) Transfers: Sit to/from Stand Sit to Stand: Min assist, +2 safety/equipment           General transfer comment: pt relied on RW to come up into standing (PT stabilizing RW)    Ambulation/Gait               General Gait Details: unable, low BP  Stairs            Wheelchair Mobility     Tilt Bed    Modified Rankin (Stroke Patients Only)       Balance Overall balance assessment: Needs assistance Sitting-balance support: Feet supported, Bilateral upper extremity supported   Sitting balance - Comments: occasional assistance for posterior lean, modA to correct   Standing balance support: Bilateral upper extremity supported, Reliant on assistive device for balance Standing balance-Leahy Scale: Poor                               Pertinent Vitals/Pain Pain Assessment Pain Assessment:  No/denies pain    Home Living Family/patient expects to be discharged to:: Assisted living                 Home Equipment: Rollator (4 wheels);Cane - single point;Shower seat;Grab bars - toilet;Grab bars - tub/shower;Hand held shower head;Wheelchair - manual Additional Comments: Home equipment obstained from prior chart review, difficult to obtain and  verify PLOF/home setup info beyond ALF    Prior Function Prior Level of Function : Patient poor historian/Family not available;Needs assist             Mobility Comments: Pt reports using SPC versus RW but has difficulty clarifying which, but then describes a WC that he had been using ADLs Comments: Pt reports indep with dressing and toileting but recently requiring assist for showering, ALF assists with IADL     Extremity/Trunk Assessment   Upper Extremity Assessment Upper Extremity Assessment: Generalized weakness    Lower Extremity Assessment Lower Extremity Assessment: Generalized weakness       Communication      Cognition Arousal: Alert Behavior During Therapy: Flat affect                                   General Comments: Pt oriented to self and hospital with choices        General Comments      Exercises     Assessment/Plan    PT Assessment Patient needs continued PT services  PT Problem List Decreased strength;Decreased range of motion;Decreased activity tolerance;Decreased balance;Decreased mobility;Decreased knowledge of use of DME;Decreased safety awareness;Decreased knowledge of precautions       PT Treatment Interventions DME instruction;Neuromuscular re-education;Gait training;Stair training;Patient/family education;Functional mobility training;Therapeutic activities;Therapeutic exercise;Balance training;Wheelchair mobility training    PT Goals (Current goals can be found in the Care Plan section)  Acute Rehab PT Goals Patient Stated Goal: to go home PT Goal Formulation: With patient Time For Goal Achievement: 07/26/23 Potential to Achieve Goals: Fair    Frequency Min 1X/week     Co-evaluation               AM-PAC PT "6 Clicks" Mobility  Outcome Measure Help needed turning from your back to your side while in a flat bed without using bedrails?: A Lot Help needed moving from lying on your back to sitting on the side  of a flat bed without using bedrails?: A Lot Help needed moving to and from a bed to a chair (including a wheelchair)?: A Lot Help needed standing up from a chair using your arms (e.g., wheelchair or bedside chair)?: A Lot Help needed to walk in hospital room?: A Lot Help needed climbing 3-5 steps with a railing? : Total 6 Click Score: 11    End of Session Equipment Utilized During Treatment: Gait belt Activity Tolerance: Other (comment) (limited by orthostatic hypotension) Patient left: in bed;with call bell/phone within reach;with bed alarm set Nurse Communication: Mobility status;Other (comment) (BP) PT Visit Diagnosis: Other abnormalities of gait and mobility (R26.89);Difficulty in walking, not elsewhere classified (R26.2);Muscle weakness (generalized) (M62.81)    Time: 7062-3762 PT Time Calculation (min) (ACUTE ONLY): 28 min   Charges:   PT Evaluation $PT Eval Moderate Complexity: 1 Mod PT Treatments $Therapeutic Activity: 23-37 mins PT General Charges $$ ACUTE PT VISIT: 1 Visit        Olga Coaster PT, DPT 11:17 AM,07/12/23

## 2023-07-12 NOTE — Plan of Care (Signed)

## 2023-07-12 NOTE — Progress Notes (Signed)
PROGRESS NOTE   HPI was taken from Dr. Alvester Morin: Corey Morrison is a 67 y.o. male with medical history significant of atrial fibrillation, coronary disease, [name] disease, recurrent falls/orthostasis presenting with syncope.  History from patient as well as his POA Corey Morrison ([id number]).  Per report, patient with worsening episodes of fall and passing out over the past week or so.  This is been a longstanding issue.  No reports of chest pain or shortness of breath.  No reports of nausea or vomiting.  Overall appetite is stable.  No recent medication change including increasing of Sinemet for [name]'s.?  Decreased fluid intake. Presented to the ER afebrile, hemodynamically stable.  White count 8.4, hemoglobin 11.1, platelets 147, creatinine 0.96.  Troponin within normal limits.  Urinalysis not indicative of infection.  EKG normal sinus rhythm with bundle branch block.  CT head within normal limits.  Was otherwise tentatively discharged with patient having syncopal episode on the way to the car.  On recheck patient orthostatic positive in the ER.   Corey Morrison  VHQ:[id number] DOB: Oct 10, 1956 DOA: 07/10/2023 PCP: [facility]   Assessment & Plan:   Principal Problem:   Postural dizziness with presyncope Active Problems:   Syncope   Weakness   CAD (coronary artery disease)   GERD without esophagitis  Assessment and Plan: Postural dizziness: continue on IVFs and increased dose of midodrine today b/c pt is still orthostatic. Likely secondary to orthostatic hypotension in setting of Parkinsons    Recurrent syncope: likely secondary to orthostatic hypotension in the setting of Parkinsons. Continue on IVFs and increased dose of midodrine today as orthostatic vitals are still positive    Weakness: PT/OT recs SNF. Fall precautions. Likely secondary to Parkinsons  Parkinsons: continue on home dose of sinemet   Normocytic anemia: H&H are stable. Will transfuse if Hb < 7.0    Hx  of CAD: continue on aspirin, zetia    GERD: continue on PPI   Thrombocytopenia: etiology unclear. Labile. Will continue to monitor     DVT prophylaxis: lovenox Code Status: DNR Family Communication:  Disposition Plan: PT/OT recs SNF. Lives at ALF as per pt's POA  Level of care: Telemetry Medical  Status is: Inpatient Remains inpatient appropriate because: severity of illness, still orthostatic       Consultants:    Procedures:   Antimicrobials:   Subjective: Pt c/o intermittent dizziness   Objective: Vitals:   07/11/23 0722 07/11/23 1552 07/11/23 2355 07/12/23 0500  BP: (!) 153/82 119/66 (!) 171/85   Pulse: 78 83 70   Resp: 17 16 17    Temp: 99.6 F (37.6 C) 98.3 F (36.8 C) 97.7 F (36.5 C)   TempSrc:  Oral    SpO2: 96% 99% 97%   Weight:    74.9 kg  Height:        Intake/Output Summary (Last 24 hours) at 07/12/2023 0824 Last data filed at 07/12/2023 0610 Gross per 24 hour  Intake 240 ml  Output 500 ml  Net -260 ml   Filed Weights   07/10/23 0959 07/11/23 0500 07/12/23 0500  Weight: 70 kg 74.6 kg 74.9 kg    Examination:  General exam: Appears comfortable  Respiratory system: clear breath sounds b/l  Cardiovascular system: S1/S2+. No rubs or clicks  Gastrointestinal system: abd is soft, NT, ND & hypoactive bowel sounds  Central nervous system: Alert & awake. Moves all extremities  Psychiatry: Judgement and insight appears at baseline. Flat mood and affect     Data Reviewed:  I have personally reviewed following labs and imaging studies  CBC: Recent Labs  Lab 07/10/23 1004 07/11/23 0443 07/12/23 0527  WBC 8.4 8.1 8.3  HGB 11.1* 10.6* 10.2*  HCT 34.4* 31.7* 30.6*  MCV 93.5 90.1 91.3  PLT 147* 154 146*   Basic Metabolic Panel: Recent Labs  Lab 07/10/23 1004 07/11/23 0443 07/12/23 0527  NA 141 143 139  K 3.9 4.1 3.9  CL 105 111 109  CO2 27 27 23   GLUCOSE 101* 93 91  BUN 33* 28* 22  CREATININE 0.96 0.86 0.86  CALCIUM 8.8* 8.6*  8.2*   GFR: Estimated Creatinine Clearance: 83.4 mL/min (by C-G formula based on SCr of 0.86 mg/dL). Liver Function Tests: Recent Labs  Lab 07/11/23 0443  AST 14*  ALT 9  ALKPHOS 58  BILITOT 0.4  PROT 6.5  ALBUMIN 3.5   No results for input(s): "LIPASE", "AMYLASE" in the last 168 hours. No results for input(s): "AMMONIA" in the last 168 hours. Coagulation Profile: No results for input(s): "INR", "PROTIME" in the last 168 hours. Cardiac Enzymes: No results for input(s): "CKTOTAL", "CKMB", "CKMBINDEX", "TROPONINI" in the last 168 hours. BNP (last 3 results) No results for input(s): "PROBNP" in the last 8760 hours. HbA1C: No results for input(s): "HGBA1C" in the last 72 hours. CBG: Recent Labs  Lab 07/10/23 1214 07/11/23 0500 07/12/23 0610 07/12/23 0714  GLUCAP 85 86 83 104*   Lipid Profile: No results for input(s): "CHOL", "HDL", "LDLCALC", "TRIG", "CHOLHDL", "LDLDIRECT" in the last 72 hours. Thyroid Function Tests: No results for input(s): "TSH", "T4TOTAL", "FREET4", "T3FREE", "THYROIDAB" in the last 72 hours. Anemia Panel: No results for input(s): "VITAMINB12", "FOLATE", "FERRITIN", "TIBC", "IRON", "RETICCTPCT" in the last 72 hours. Sepsis Labs: Recent Labs  Lab 07/10/23 1556 07/10/23 1852  LATICACIDVEN 1.5 1.3    Recent Results (from the past 240 hour(s))  SARS Coronavirus 2 by RT PCR (hospital order, performed in [facility] hospital lab) *cepheid single result test* Anterior Nasal Swab     Status: None   Collection Time: 07/10/23 10:44 AM   Specimen: Anterior Nasal Swab  Result Value Ref Range Status   SARS Coronavirus 2 by RT PCR NEGATIVE NEGATIVE Final    Comment: (NOTE) SARS-CoV-2 target nucleic acids are NOT DETECTED.  The SARS-CoV-2 RNA is generally detectable in upper and lower respiratory specimens during the acute phase of infection. The lowest concentration of SARS-CoV-2 viral copies this assay can detect is 250 copies / mL. A negative result  does not preclude SARS-CoV-2 infection and should not be used as the sole basis for treatment or other patient management decisions.  A negative result may occur with improper specimen collection / handling, submission of specimen other than nasopharyngeal swab, presence of viral mutation(s) within the areas targeted by this assay, and inadequate number of viral copies (<250 copies / mL). A negative result must be combined with clinical observations, patient history, and epidemiological information.  Fact Sheet for Patients:   RoadLapTop.co.za  Fact Sheet for Healthcare Providers: http://kim-Castellana.com/  This test is not yet approved or  cleared by the [place] FDA and has been authorized for detection and/or diagnosis of SARS-CoV-2 by FDA under an Emergency Use Authorization (EUA).  This EUA will remain in effect (meaning this test can be used) for the duration of the COVID-19 declaration under Section 564(b)(1) of the Act, 21 U.S.C. section 360bbb-3(b)(1), unless the authorization is terminated or revoked sooner.  Performed at [facility], [address] [place], [place]  [address]   MRSA Next Gen by PCR, Nasal     Status: None   Collection Time: 07/10/23  5:49 PM   Specimen: Nasal Mucosa; Nasal Swab  Result Value Ref Range Status   MRSA by PCR Next Gen NOT DETECTED NOT DETECTED Final    Comment: (NOTE) The GeneXpert MRSA Assay (FDA approved for NASAL specimens only), is one component of a comprehensive MRSA colonization surveillance program. It is not intended to diagnose MRSA infection nor to guide or monitor treatment for MRSA infections. Test performance is not FDA approved in patients less than 22 years old. Performed at [facility], [address] [place], [place] [address]          Radiology Studies: CT Head Wo Contrast  Result Date: 07/10/2023 CLINICAL DATA:  Provided history: Neuro  deficit, acute, stroke suspected. Additional history provided: Slurred speech, right arm drift/weakness. History of seizure. EXAM: CT HEAD WITHOUT CONTRAST TECHNIQUE: Contiguous axial images were obtained from the base of the skull through the vertex without intravenous contrast. RADIATION DOSE REDUCTION: This exam was performed according to the departmental dose-optimization program which includes automated exposure control, adjustment of the mA and/or kV according to patient size and/or use of iterative reconstruction technique. COMPARISON:  Head CT 06/10/2023. FINDINGS: Mildly motion degraded exam. Within this limitation, findings are as follows. Brain: No age advanced or lobar predominant parenchymal atrophy. Patchy and ill-defined hypoattenuation within the cerebral white matter, nonspecific but compatible with mild chronic small vessel ischemic disease. There is no acute intracranial hemorrhage. No demarcated cortical infarct. No extra-axial fluid collection. No evidence of an intracranial mass. No midline shift. Vascular: No hyperdense vessel.  Atherosclerotic calcifications. Skull: No calvarial fracture or aggressive osseous lesion. Sinuses/Orbits: No mass or acute finding within the imaged orbits. Small-volume frothy secretions within the right sphenoid sinus. Minimal mucosal thickening within the right maxillary sinus at the imaged levels. IMPRESSION: 1. Mildly motion degraded exam. 2.  No evidence of an acute intracranial abnormality. 3. Mild chronic small vessel ischemic changes within the cerebral white matter. 4. Paranasal sinus disease at the imaged levels, as described. Electronically Signed   By: Jackey Loge D.O.   On: 07/10/2023 10:55        Scheduled Meds:  aspirin EC  81 mg Oral Daily   carbidopa-levodopa  2 tablet Oral 5 X Daily   And   carbidopa-levodopa  3 tablet Oral Q0600   enoxaparin (LOVENOX) injection  40 mg Subcutaneous Q24H   ezetimibe  10 mg Oral Daily   midodrine  7.5 mg  Oral TID WC   sodium chloride flush  3 mL Intravenous Q12H   Continuous Infusions:  sodium chloride 75 mL/hr at 07/11/23 1730     LOS: 1 day    Time spent: 35 mins     Charise Killian, MD [facility] Pager [phone]  If 7PM-7AM, please contact night-coverage [email or web address] 07/12/2023, 8:24 AM

## 2023-07-13 LAB — BASIC METABOLIC PANEL
Anion gap: 7 (ref 5–15)
BUN: 18 mg/dL (ref 8–23)
CO2: 23 mmol/L (ref 22–32)
Calcium: 8.4 mg/dL — ABNORMAL LOW (ref 8.9–10.3)
Chloride: 109 mmol/L (ref 98–111)
Creatinine, Ser: 0.83 mg/dL (ref 0.61–1.24)
GFR, Estimated: >60 mL/min (ref >60)
Glucose, Bld: 96 mg/dL (ref 70–99)
Potassium: 3.9 mmol/L (ref 3.5–5.1)
Sodium: 139 mmol/L (ref 135–145)

## 2023-07-13 LAB — CBC
HCT: 30.7 % — ABNORMAL LOW (ref 39.0–52.0)
Hemoglobin: 10.3 g/dL — ABNORMAL LOW (ref 13.0–17.0)
MCH: 30 pg (ref 26.0–34.0)
MCHC: 33.6 g/dL (ref 30.0–36.0)
MCV: 89.5 fL (ref 80.0–100.0)
Platelets: 139 10*3/uL — ABNORMAL LOW (ref 150–400)
RBC: 3.43 MIL/uL — ABNORMAL LOW (ref 4.22–5.81)
RDW: 13 % (ref 11.5–15.5)
WBC: 10.7 10*3/uL — ABNORMAL HIGH (ref 4.0–10.5)
nRBC: 0 % (ref 0.0–0.2)

## 2023-07-13 LAB — GLUCOSE, CAPILLARY
Glucose-Capillary: 94 mg/dL (ref 70–99)
Glucose-Capillary: 82 mg/dL (ref 70–99)
Glucose-Capillary: 94 mg/dL (ref 70–99)

## 2023-07-13 MED ORDER — SERTRALINE HCL 50 MG PO TABS
150.0000 mg | ORAL_TABLET | Freq: Every day | ORAL | Status: DC
  Administered 2023-07-13 – 2023-07-17 (×5): 150 mg via ORAL
  Filled 2023-07-13 (×5): qty 3

## 2023-07-13 NOTE — NC FL2 (Signed)
[place] MEDICAID FL2 LEVEL OF CARE FORM     IDENTIFICATION  Patient Name: Corey Morrison Birthdate: [birth year] Sex: male Admission Date (Current Location): 07/10/2023  [place] and [place] Number:  Chiropodist and Address:  [facility], [address], [place], [place] [address]      Provider Number: [id number]  Attending Physician Name and Address:  Ernestene Mention, MD  Relative Name and Phone Number:  [name] [facility]) Clearence Cheek   [phone]    Current Level of Care: Hospital Recommended Level of Care: Skilled Nursing Facility Prior Approval Number:    Date Approved/Denied:   PASRR Number: Pending  Discharge Plan: SNF    Current Diagnoses: Patient Active Problem List   Diagnosis Date Noted   Postural dizziness with presyncope 07/10/2023   Syncope 07/10/2023   Altered mental status 02/04/2023   Fall 02/03/2023   CAD (coronary artery disease) 02/03/2023   Depression with anxiety 02/03/2023   Hypotension 02/03/2023   Leukocytosis 02/03/2023   Acute metabolic encephalopathy 02/03/2023   Prolonged QT interval 02/03/2023   Hallucination 07/27/2022   Peripheral neuropathy 07/27/2022   [name] disease, symptomatic 07/23/2022   GERD without esophagitis 07/21/2022   Dyslipidemia 07/21/2022   Vitamin B12 deficiency 07/21/2022   Depression 07/21/2022   Weakness    Nocturnal cough 06/25/2022   Insomnia 06/25/2022   Orthostatic hypotension 06/23/2022   Paroxysmal atrial fibrillation (HCC) 06/23/2022   Normocytic anemia 06/23/2022   Ambulatory dysfunction 06/22/2022   [name]'s disease 06/11/2022    Orientation RESPIRATION BLADDER Height & Weight     Self, Place  Normal Incontinent Weight: 164 lb 3.9 oz (74.5 kg) Height:  5\' 9"  (175.3 cm)  BEHAVIORAL SYMPTOMS/MOOD NEUROLOGICAL BOWEL NUTRITION STATUS      Continent Diet  AMBULATORY STATUS COMMUNICATION OF NEEDS Skin   Limited Assist Verbally Normal                        Personal Care Assistance Level of Assistance  Bathing, Feeding, Dressing Bathing Assistance: Limited assistance Feeding assistance: Independent Dressing Assistance: Limited assistance     Functional Limitations Info  Sight, Hearing, Speech Sight Info: Adequate Hearing Info: Adequate Speech Info: Adequate    SPECIAL CARE FACTORS FREQUENCY  PT (By licensed PT), OT (By licensed OT)     PT Frequency: Minimum 5x a week OT Frequency: Minimum 5x a week            Contractures Contractures Info: Not present    Additional Factors Info  Code Status, Allergies, Psychotropic Code Status Info: DNR Allergies Info: NKA Psychotropic Info: sertraline (ZOLOFT) tablet 150 mg         Current Medications (07/13/2023):  This is the current hospital active medication list Current Facility-Administered Medications  Medication Dose Route Frequency Provider Last Rate Last Admin   0.9 %  sodium chloride infusion   Intravenous Continuous Kadali, Renuka A, MD 75 mL/hr at 07/13/23 0957 New Bag at 07/13/23 0957   aspirin EC tablet 81 mg  81 mg Oral Daily Charise Killian, MD   81 mg at 07/13/23 0949   carbidopa-levodopa (SINEMET IR) 25-100 MG per tablet immediate release 2 tablet  2 tablet Oral 5 X Daily Effie Shy, RPH   2 tablet at 07/13/23 1726   And   carbidopa-levodopa (SINEMET IR) 25-100 MG per tablet immediate release 3 tablet  3 tablet Oral Q0600 Effie Shy, RPH   3 tablet at 07/13/23 5621  enoxaparin (LOVENOX) injection 40 mg  40 mg Subcutaneous Q24H Floydene Flock, MD   40 mg at 07/12/23 2129   ezetimibe (ZETIA) tablet 10 mg  10 mg Oral Daily Charise Killian, MD   10 mg at 07/13/23 0950   magnesium hydroxide (MILK OF MAGNESIA) suspension 15 mL  15 mL Oral Daily PRN Charise Killian, MD   15 mL at 07/11/23 1659   midodrine (PROAMATINE) tablet 10 mg  10 mg Oral TID WC Charise Killian, MD   10 mg at 07/13/23 1726   pantoprazole (PROTONIX) EC tablet 20 mg  20 mg  Oral Daily Charise Killian, MD   20 mg at 07/13/23 0949   sertraline (ZOLOFT) tablet 150 mg  150 mg Oral QHS Kadali, Renuka A, MD       sodium chloride flush (NS) 0.9 % injection 3 mL  3 mL Intravenous Q12H Floydene Flock, MD   3 mL at 07/13/23 0950     Discharge Medications: Please see discharge summary for a list of discharge medications.  Relevant Imaging Results:  Relevant Lab Results:   Additional Information SSN [id number]  Darleene Cleaver, LCSW

## 2023-07-13 NOTE — TOC Progression Note (Signed)
Transition of Care [facility]) - Progression Note    Patient Details  Name: Corey Morrison MRN: [id number] Date of Birth: 24-May-1956  Transition of Care [facility] CM/SW Contact  Darleene Cleaver, [place] Phone Number: 07/13/2023, 7:18 PM  Clinical Narrative:     PT and OT are recommending SNF placement for short term rehab.  CSW attempted to contact patient's POA, but had to leave a voice mail awaiting for a call back.  Patient has been faxed to SNFs awaiting bed offers.  Patient is from [facility], plan to return after short term rehab.  TOC to continue to follow patient's progress throughout discharge planning.  Expected Discharge Plan: Skilled Nursing Facility Barriers to Discharge: Continued Medical Work up  Expected Discharge Plan and Services     Post Acute Care Choice: Skilled Nursing Facility Living arrangements for the past 2 months: Assisted Living Facility                                       Social Determinants of Health (SDOH) Interventions SDOH Screenings   Food Insecurity: No Food Insecurity (07/10/2023)  Housing: Patient Declined (07/10/2023)  Transportation Needs: No Transportation Needs (07/10/2023)  Utilities: Not At Risk (07/10/2023)  Social Connections: Unknown (01/24/2023)   Received from [facility]  Tobacco Use: Medium Risk (07/10/2023)   [facility] from [facility]    Readmission Risk Interventions    07/23/2022    4:25 PM  Readmission Risk Prevention Plan  Transportation Screening Complete  Medication Review (RN Care Manager) Complete  PCP or Specialist appointment within 3-5 days of discharge Complete  HRI or Home Care Consult Complete  SW Recovery Care/Counseling Consult Complete  Palliative Care Screening Complete  Skilled Nursing Facility Complete

## 2023-07-13 NOTE — TOC Initial Note (Signed)
Transition of Care [facility]) - Initial/Assessment Note    Patient Details  Name: Corey Morrison MRN: [id number] Date of Birth: 10-08-1956  Transition of Care [facility]) CM/SW Contact:    Darleene Cleaver, LCSW Phone Number: 07/12/2023, 6:55 PM  Clinical Narrative:                  Patient is a  67 year old male who is alert and oriented x2.  Patient is a resident at [facility].  Patient has some confusion assessment completed by chart review.  CSW attempted to contact patient's POA [name] and had to leave a message.  Patient was seen for possible stroke, PT and OT worked with him and are recommending SNF for short term rehab prior to returning back to ALF.  CSW to begin bed search in [place].  TOC to continue to follow patient's progress throughout discharge planning.  Expected Discharge Plan: Skilled Nursing Facility Barriers to Discharge: Continued Medical Work up   Patient Goals and CMS Choice Patient states their goals for this hospitalization and ongoing recovery are:: To go to SNF for rehab then return back to [facility] CMS Medicare.gov Compare Post Acute Care list provided to:: Other (Comment Required) (Chart review)        Expected Discharge Plan and Services     Post Acute Care Choice: Skilled Nursing Facility Living arrangements for the past 2 months: Assisted Living Facility                                      Prior Living Arrangements/Services Living arrangements for the past 2 months: Assisted Living Facility Lives with:: Facility Resident Patient language and need for interpreter reviewed:: Yes Do you feel safe going back to the place where you live?: No   Patient needs short term rehab prior to discharging back to [facility].  Need for Family Participation in Patient Care: Yes (Comment) Care giver support system in place?: Yes (comment) Current home services: Home PT    Activities of Daily Living Home Assistive Devices/Equipment:  Walker (specify type) (front wheel) ADL Screening (condition at time of admission) Patient's cognitive ability adequate to safely complete daily activities?: No Is the patient deaf or have difficulty hearing?: No Does the patient have difficulty seeing, even when wearing glasses/contacts?: Yes Does the patient have difficulty concentrating, remembering, or making decisions?: Yes Patient able to express need for assistance with ADLs?: Yes Does the patient have difficulty dressing or bathing?: Yes Independently performs ADLs?: No Communication: Independent Dressing (OT): Needs assistance Feeding: Independent Does the patient have difficulty walking or climbing stairs?: Yes Weakness of Legs: Both Weakness of Arms/Hands: Both  Permission Sought/Granted Permission sought to share information with : Case Manager, Magazine features editor, Other (comment) [name] POA) Permission granted to share information with : Yes, Release of Information Signed  Share Information with NAME: Johnson,Rick (POA) Friend   [phone]  Permission granted to share info w AGENCY: SNF admissions and [facility]        Emotional Assessment Appearance:: Appears stated age Attitude/Demeanor/Rapport: Unable to Assess   Orientation: : Oriented to Self, Oriented to Place Alcohol / Substance Use: Not Applicable Psych Involvement: No (comment)  Admission diagnosis:  Orthostatic hypotension [I95.1] Generalized weakness [R53.1] Postural dizziness with presyncope [R42, R55] Syncope [R55] Patient Active Problem List   Diagnosis Date Noted   Postural dizziness with  presyncope 07/10/2023   Syncope 07/10/2023   Altered mental status 02/04/2023   Fall 02/03/2023   CAD (coronary artery disease) 02/03/2023   Depression with anxiety 02/03/2023   Hypotension 02/03/2023   Leukocytosis 02/03/2023   Acute metabolic encephalopathy 02/03/2023   Prolonged QT interval 02/03/2023   Hallucination 07/27/2022    Peripheral neuropathy 07/27/2022   [name] disease, symptomatic 07/23/2022   GERD without esophagitis 07/21/2022   Dyslipidemia 07/21/2022   Vitamin B12 deficiency 07/21/2022   Depression 07/21/2022   Weakness    Nocturnal cough 06/25/2022   Insomnia 06/25/2022   Orthostatic hypotension 06/23/2022   Paroxysmal atrial fibrillation (HCC) 06/23/2022   Normocytic anemia 06/23/2022   Ambulatory dysfunction 06/22/2022   [name]'s disease 06/11/2022   PCP:  [facility] Pharmacy:  No Pharmacies Listed    Social Determinants of Health (SDOH) Social History: SDOH Screenings   Food Insecurity: No Food Insecurity (07/10/2023)  Housing: Patient Declined (07/10/2023)  Transportation Needs: No Transportation Needs (07/10/2023)  Utilities: Not At Risk (07/10/2023)  Social Connections: Unknown (01/24/2023)   Received from [facility]  Tobacco Use: Medium Risk (07/10/2023)   [facility]   SDOH Interventions:     Readmission Risk Interventions    07/23/2022    4:25 PM  Readmission Risk Prevention Plan  Transportation Screening Complete  Medication Review (RN Care Manager) Complete  PCP or Specialist appointment within 3-5 days of discharge Complete  HRI or Home Care Consult Complete  SW Recovery Care/Counseling Consult Complete  Palliative Care Screening Complete  Skilled Nursing Facility Complete

## 2023-07-13 NOTE — Progress Notes (Signed)
Physical Therapy Treatment Patient Details Name: Corey Morrison MRN: [id number] DOB: [birth year] Today's Date: 07/13/2023   History of Present Illness Pt is a 67 y.o. male with history of [name]'s disease, paroxysmal atrial fibrillation, CAD, and recurrent falls/orthostasis presenting with syncope.    PT Comments  Patient is agreeable to PT. He continues to require assistance with bed mobility and transfers. 2 standing bouts performed with seated rest break between bouts. Patient has orthostatic hypotension with standing and reports dizziness with standing tolerance of less than 3 minutes. Did not progress ambulation for safety due to orthostatic hypotension. Recommend to continue PT to maximize independence and facilitate return to prior level of function.    If plan is discharge home, recommend the following: Two people to help with walking and/or transfers;Two people to help with bathing/dressing/bathroom;Direct supervision/assist for medications management;Help with stairs or ramp for entrance;Assist for transportation;Assistance with cooking/housework;Supervision due to cognitive status   Can travel by private vehicle     No  Equipment Recommendations  None recommended by PT    Recommendations for Other Services       Precautions / Restrictions Precautions Precautions: Fall Restrictions Weight Bearing Restrictions: No     Mobility  Bed Mobility Overal bed mobility: Needs Assistance Bed Mobility: Supine to Sit, Sit to Supine     Supine to sit: Mod assist Sit to supine: Mod assist   General bed mobility comments: increased time required. cues for sequencing    Transfers Overall transfer level: Needs assistance Equipment used: Rolling walker (2 wheels) Transfers: Sit to/from Stand Sit to Stand: Mod assist           General transfer comment: 2 standing bouts performed with cues for technique    Ambulation/Gait             Pre-gait activities: standing  tolerance less than 3 minutes x 2 bouts with seated rest break. patient is able to take several side steps to left/right with rolling walker for support. patient does report dizziness with standing with + orthostatics General Gait Details: not attempted for safety due to orthostatic hypotension and dizziness   Stairs             Wheelchair Mobility     Tilt Bed    Modified Rankin (Stroke Patients Only)       Balance Overall balance assessment: Needs assistance Sitting-balance support: Feet supported, Bilateral upper extremity supported Sitting balance-Leahy Scale: Fair     Standing balance support: Bilateral upper extremity supported, Reliant on assistive device for balance Standing balance-Leahy Scale: Poor Standing balance comment: external support provided for safety using rolling walker for support. occasional posterior lean                            Cognition Arousal: Alert Behavior During Therapy: Flat affect Overall Cognitive Status: No family/caregiver present to determine baseline cognitive functioning                                 General Comments: patient is confused but cooperative (disoriented to situation/time). he is able to follow single step commands with increased time.        Exercises      General Comments        Pertinent Vitals/Pain Pain Assessment Pain Assessment: No/denies pain    Home Living  Prior Function            PT Goals (current goals can now be found in the care plan section) Acute Rehab PT Goals Patient Stated Goal: to go home PT Goal Formulation: With patient Time For Goal Achievement: 07/26/23 Potential to Achieve Goals: Fair Progress towards PT goals: Progressing toward goals    Frequency    Min 1X/week      PT Plan      Co-evaluation              AM-PAC PT "6 Clicks" Mobility   Outcome Measure  Help needed turning from your back  to your side while in a flat bed without using bedrails?: A Lot Help needed moving from lying on your back to sitting on the side of a flat bed without using bedrails?: A Lot Help needed moving to and from a bed to a chair (including a wheelchair)?: A Lot Help needed standing up from a chair using your arms (e.g., wheelchair or bedside chair)?: A Lot Help needed to walk in hospital room?: A Lot Help needed climbing 3-5 steps with a railing? : Total 6 Click Score: 11    End of Session   Activity Tolerance: Patient limited by fatigue Patient left: in bed;with call bell/phone within reach;with bed alarm set Nurse Communication: Mobility status PT Visit Diagnosis: Other abnormalities of gait and mobility (R26.89);Difficulty in walking, not elsewhere classified (R26.2);Muscle weakness (generalized) (M62.81)     Time: 0865-7846 PT Time Calculation (min) (ACUTE ONLY): 24 min  Charges:    $Therapeutic Activity: 23-37 mins PT General Charges $$ ACUTE PT VISIT: 1 Visit                     Donna Bernard, PT, MPT   Ina Homes 07/13/2023, 1:35 PM

## 2023-07-13 NOTE — Plan of Care (Signed)

## 2023-07-13 NOTE — Progress Notes (Signed)
Progress Note   Patient: Corey Morrison VWU:[id number] DOB: Jul 26, 1956 DOA: 07/10/2023     2 DOS: the patient was seen and examined on 07/13/2023   Brief hospital course: 67 y.o. male with medical history significant of atrial fibrillation, coronary disease, [name] disease, recurrent falls/orthostasis presenting with syncope. Still orthostatic, so increased midodrine. Consider adding florinef if still orthostatic after increase in midodrine dose   Assessment and Plan: * Postural dizziness with presyncope: Likely secondary to orthostatic hypotension in setting of Parkinsons  Continue on IVFs and increased dose of midodrine today b/c pt is still orthostatic. Repeat orthostatics today Consider adding florinef if still orthostatic after increase in midodrine dose    Syncope: Recurrent syncope in the setting of [name]'s disease, orthostatic hypotension, gait instability Continue on IVFs and increased dose of midodrine today as orthostatic vitals are still positive  Titrate medication regimen   Physical debility:  Likely secondary to Parkinsons  PT/OT recs SNF.  Fall precautions  CAD (coronary artery disease): No active chest symptoms. Continue on aspirin, zetia   GERD without esophagitis: continue PPI   Parkinsons: continue sinemet    Normocytic anemia: H&H are stable 10.3/30.7 this am. No need for transfusion at this time F/u H and H      Subjective: Still reports dizziness, chills Denies chest symptoms, fever, abdominal symptoms, headache  Physical Exam: Vitals:   07/12/23 1424 07/12/23 2156 07/13/23 0500 07/13/23 0722  BP: 109/71 (!) 168/86  135/64  Pulse: 68 81  64  Resp: 17 18  18   Temp: 98.5 F (36.9 C) 98 F (36.7 C)  98.3 F (36.8 C)  TempSrc:      SpO2: 98% 100%  98%  Weight:   74.5 kg   Height:       Physical Exam Constitutional:      General: He is not in acute distress.    Appearance: Normal appearance. He is not ill-appearing.  HENT:     Head:  Normocephalic and atraumatic.     Nose: Nose normal. No congestion.     Mouth/Throat:     Mouth: Mucous membranes are moist.     Pharynx: Oropharynx is clear. No oropharyngeal exudate.  Eyes:     Extraocular Movements: Extraocular movements intact.     Conjunctiva/sclera: Conjunctivae normal.     Pupils: Pupils are equal, round, and reactive to light.  Cardiovascular:     Rate and Rhythm: Normal rate and regular rhythm.     Pulses: Normal pulses.     Heart sounds: Normal heart sounds. No murmur heard. Pulmonary:     Effort: Pulmonary effort is normal. No respiratory distress.     Breath sounds: Normal breath sounds.  Abdominal:     General: Abdomen is flat. Bowel sounds are normal.     Palpations: Abdomen is soft.  Musculoskeletal:        General: No swelling or tenderness. Normal range of motion.     Cervical back: Normal range of motion and neck supple.     Right lower leg: No edema.     Left lower leg: No edema.  Skin:    General: Skin is warm.     Capillary Refill: Capillary refill takes 2 to 3 seconds.  Neurological:     General: No focal deficit present.     Mental Status: He is alert. Mental status is at baseline.     Cranial Nerves: No cranial nerve deficit.     Sensory: No sensory deficit.  Motor: No weakness.  Psychiatric:        Mood and Affect: Mood normal.        Behavior: Behavior normal.     Data Reviewed:  Latest Reference Range & Units 07/13/23 03:22  Sodium 135 - 145 mmol/L 139  Potassium 3.5 - 5.1 mmol/L 3.9  Chloride 98 - 111 mmol/L 109  CO2 22 - 32 mmol/L 23  Glucose 70 - 99 mg/dL 96  BUN 8 - 23 mg/dL 18  Creatinine 1.61 - 0.96 mg/dL 0.45  Calcium 8.9 - 40.9 mg/dL 8.4 (L)  Anion gap 5 - 15  7  GFR, Estimated >60 mL/min >60  WBC 4.0 - 10.5 K/uL 10.7 (H)  RBC 4.22 - 5.81 MIL/uL 3.43 (L)  Hemoglobin 13.0 - 17.0 g/dL 81.1 (L)  HCT 91.4 - 78.2 % 30.7 (L)  MCV 80.0 - 100.0 fL 89.5  MCH 26.0 - 34.0 pg 30.0  MCHC 30.0 - 36.0 g/dL 95.6  RDW 21.3  - 08.6 % 13.0  Platelets 150 - 400 K/uL 139 (L)  nRBC 0.0 - 0.2 % 0.0  (L): Data is abnormally low (H): Data is abnormally high  Family Communication: none  Disposition: Status is: Inpatient   Planned Discharge Destination: Skilled nursing facility    Time spent: 25 minutes  Author: Ernestene Mention, MD 07/13/2023 8:43 AM  For on call review [email or web address].

## 2023-07-13 NOTE — Progress Notes (Signed)
Occupational Therapy Treatment Patient Details Name: Corey Morrison MRN: [id number] DOB: [birth year] Today's Date: 07/13/2023   History of present illness Pt is a 67 y.o. male with history of [name]'s disease, paroxysmal atrial fibrillation, CAD, and recurrent falls/orthostasis presenting with syncope.   OT comments  Pt seen for OT tx this date. Pt agreeable, noting he is cold. Thermostat adjusted. Pt required MOD A For sup>sit but realized once almost in sitting that he had BM in bed and needed assist for cleaning up. Pt returned to bed in side lying position and able to maintain while requiring MAX A for pericare. Pt engaged in rolling side to side with MIN-MOD A to complete with VC for initiating and sequencing use of bed rails. Pt returned to bed. BP 140/65, temp reading 94.3*F. RN promptly notified via secure chat including pt's report of feeling chilled but sweaty. Pt continues to benefit from skilled OT Services.       If plan is discharge home, recommend the following:  A lot of help with walking and/or transfers;A lot of help with bathing/dressing/bathroom;Assistance with cooking/housework;Assist for transportation;Help with stairs or ramp for entrance;Direct supervision/assist for medications management;Direct supervision/assist for financial management   Equipment Recommendations  BSC/3in1    Recommendations for Other Services      Precautions / Restrictions Precautions Precautions: Fall Precaution Comments: monitor orthostatics Restrictions Weight Bearing Restrictions: No       Mobility Bed Mobility Overal bed mobility: Needs Assistance Bed Mobility: Rolling, Supine to Sit, Sit to Supine Rolling: Min assist, Used rails, Mod assist   Supine to sit: Mod assist Sit to supine: Mod assist        Transfers                         Balance Overall balance assessment: Needs assistance Sitting-balance support: Feet supported, Bilateral upper extremity supported,  No upper extremity supported Sitting balance-Leahy Scale: Fair                                     ADL either performed or assessed with clinical judgement   ADL Overall ADL's : Needs assistance/impaired     Grooming: Sitting;Set up;Wash/dry face;Supervision/safety                       Toileting- Clothing Manipulation and Hygiene: Maximal assistance;Bed level Toileting - Clothing Manipulation Details (indicate cue type and reason): In sidelying, pt required MAX A for pericare after incontinent of BM            Extremity/Trunk Assessment              Vision       Perception     Praxis      Cognition Arousal: Alert Behavior During Therapy: Flat affect                                   General Comments: Pt follows commands wiht time/cues        Exercises      Shoulder Instructions       General Comments      Pertinent Vitals/ Pain       Pain Assessment Pain Assessment: No/denies pain  Home Living  Prior Functioning/Environment              Frequency  Min 1X/week        Progress Toward Goals  OT Goals(current goals can now be found in the care plan section)  Progress towards OT goals: Progressing toward goals  Acute Rehab OT Goals Patient Stated Goal: go back to ALF OT Goal Formulation: With patient Time For Goal Achievement: 07/25/23 Potential to Achieve Goals: Good  Plan      Co-evaluation                 AM-PAC OT "6 Clicks" Daily Activity     Outcome Measure   Help from another person eating meals?: None Help from another person taking care of personal grooming?: A Little Help from another person toileting, which includes using toliet, bedpan, or urinal?: A Lot Help from another person bathing (including washing, rinsing, drying)?: A Lot Help from another person to put on and taking off regular upper body clothing?: A  Little Help from another person to put on and taking off regular lower body clothing?: A Lot 6 Click Score: 16    End of Session    OT Visit Diagnosis: Other abnormalities of gait and mobility (R26.89);History of falling (Z91.81);Muscle weakness (generalized) (M62.81)   Activity Tolerance Patient tolerated treatment well   Patient Left in bed;with call bell/phone within reach;with bed alarm set   Nurse Communication Mobility status;Other (comment) (BM, needs IV disconnected for new gown, temp 94.3 under tongue)        Time: 5621-3086 OT Time Calculation (min): 19 min  Charges: OT General Charges $OT Visit: 1 Visit OT Treatments $Self Care/Home Management : 8-22 mins  Arman Filter., MPH, MS, OTR/L ascom [phone] 07/13/23, 2:16 PM

## 2023-07-14 ENCOUNTER — Inpatient Hospital Stay: Payer: Medicare Other

## 2023-07-14 LAB — URINALYSIS, ROUTINE W REFLEX MICROSCOPIC
Bilirubin Urine: NEGATIVE
Glucose, UA: 50 mg/dL — AB
Ketones, ur: 5 mg/dL — AB
Leukocytes,Ua: NEGATIVE
Nitrite: NEGATIVE
Protein, ur: NEGATIVE mg/dL
Specific Gravity, Urine: 1.016 (ref 1.005–1.030)
Squamous Epithelial / HPF: NONE SEEN /HPF (ref 0–5)
pH: 5 (ref 5.0–8.0)

## 2023-07-14 LAB — AMMONIA: Ammonia: 18 umol/L (ref 9–35)

## 2023-07-14 LAB — COMPREHENSIVE METABOLIC PANEL
ALT: <5 U/L (ref 0–44)
AST: 11 U/L — ABNORMAL LOW (ref 15–41)
Albumin: 3.1 g/dL — ABNORMAL LOW (ref 3.5–5.0)
Alkaline Phosphatase: 56 U/L (ref 38–126)
Anion gap: 5 (ref 5–15)
BUN: 16 mg/dL (ref 8–23)
CO2: 25 mmol/L (ref 22–32)
Calcium: 8.5 mg/dL — ABNORMAL LOW (ref 8.9–10.3)
Chloride: 110 mmol/L (ref 98–111)
Creatinine, Ser: 0.77 mg/dL (ref 0.61–1.24)
GFR, Estimated: >60 mL/min (ref >60)
Glucose, Bld: 83 mg/dL (ref 70–99)
Potassium: 4.1 mmol/L (ref 3.5–5.1)
Sodium: 140 mmol/L (ref 135–145)
Total Bilirubin: 0.4 mg/dL (ref 0.3–1.2)
Total Protein: 6.4 g/dL — ABNORMAL LOW (ref 6.5–8.1)

## 2023-07-14 LAB — CBC
HCT: 30.4 % — ABNORMAL LOW (ref 39.0–52.0)
Hemoglobin: 10 g/dL — ABNORMAL LOW (ref 13.0–17.0)
MCH: 30.1 pg (ref 26.0–34.0)
MCHC: 32.9 g/dL (ref 30.0–36.0)
MCV: 91.6 fL (ref 80.0–100.0)
Platelets: 141 10*3/uL — ABNORMAL LOW (ref 150–400)
RBC: 3.32 MIL/uL — ABNORMAL LOW (ref 4.22–5.81)
RDW: 13.1 % (ref 11.5–15.5)
WBC: 9.5 10*3/uL (ref 4.0–10.5)
nRBC: 0 % (ref 0.0–0.2)

## 2023-07-14 LAB — TSH: TSH: 3.206 u[IU]/mL (ref 0.350–4.500)

## 2023-07-14 LAB — GLUCOSE, CAPILLARY
Glucose-Capillary: 79 mg/dL (ref 70–99)
Glucose-Capillary: 122 mg/dL — ABNORMAL HIGH (ref 70–99)
Glucose-Capillary: 105 mg/dL — ABNORMAL HIGH (ref 70–99)

## 2023-07-14 LAB — LACTIC ACID, PLASMA: Lactic Acid, Venous: 1.4 mmol/L (ref 0.5–1.9)

## 2023-07-14 MED ORDER — SODIUM CHLORIDE 0.9 % IV SOLN
1.0000 g | INTRAVENOUS | Status: DC
  Administered 2023-07-14 – 2023-07-17 (×4): 1 g via INTRAVENOUS
  Filled 2023-07-14 (×5): qty 10

## 2023-07-14 MED ORDER — SODIUM CHLORIDE 0.9 % IV BOLUS
1000.0000 mL | Freq: Once | INTRAVENOUS | Status: AC
  Administered 2023-07-14: 1000 mL via INTRAVENOUS

## 2023-07-14 NOTE — TOC Progression Note (Addendum)
Transition of Care [facility]) - Progression Note    Patient Details  Name: Corey Morrison MRN: [id number] Date of Birth: July 07, 1956  Transition of Care [facility]) CM/SW Contact  Liliana Cline, LCSW Phone Number: 07/14/2023, 9:04 AM  Clinical Narrative:    Attempted calls to POA Rick x 3.  Called Dustin at [facility]. Left VM requesting return call.  PASRR is pending.   9:30- Call from [place], he has spoken with the POA [name] who said he would be out of town.  Amalia Hailey states patient went to [facility] in the past for rehab.   11:30- Attempted another call to the [facility]. Left VM requesting return call.   12:15- Call from [facility] who states he does not want STR. He wants to see if [facility] will take patient can without STR. If he does need STR, [name] prefers [facility].  Called Dustin at [facility] who states patient can come back to [facility] without doing STR. If today, they would need to get him before 3pm. If over the weekend, TOC to set up transport and would just need to call the main number to coordinate with ALF staff.     Expected Discharge Plan: Skilled Nursing Facility Barriers to Discharge: Continued Medical Work up  Expected Discharge Plan and Services     Post Acute Care Choice: Skilled Nursing Facility Living arrangements for the past 2 months: Assisted Living Facility                                       Social Determinants of Health (SDOH) Interventions SDOH Screenings   Food Insecurity: No Food Insecurity (07/10/2023)  Housing: Patient Declined (07/10/2023)  Transportation Needs: No Transportation Needs (07/10/2023)  Utilities: Not At Risk (07/10/2023)  Social Connections: Unknown (01/24/2023)   Received from [facility]  Tobacco Use: Medium Risk (07/10/2023)   Received from [facility]    Readmission Risk Interventions    07/23/2022    4:25 PM  Readmission Risk Prevention Plan  Transportation Screening Complete  Medication Review  (RN Care Manager) Complete  PCP or Specialist appointment within 3-5 days of discharge Complete  HRI or Home Care Consult Complete  SW Recovery Care/Counseling Consult Complete  Palliative Care Screening Complete  Skilled Nursing Facility Complete

## 2023-07-14 NOTE — Progress Notes (Signed)
Progress Note   Patient: Corey Morrison WUJ:[id number] DOB: 08-17-1956 DOA: 07/10/2023     3 DOS: the patient was seen and examined on 07/14/2023   Brief hospital course: 67 y.o. male with medical history significant of atrial fibrillation, coronary disease, [name] disease, recurrent falls/orthostasis presenting with syncope. Still orthostatic, so increased midodrine. Consider adding florinef if still orthostatic after increase in midodrine dose   Assessment and Plan: Acute encephalopathy, Hypotension (93/56) and hypothermia: ? sepsis BP is low,  Obtain UA, CBC, CMP, LA, Blood cx, CXR Start IVF bolus 1L Start IV Rocephin empirically  Continuous IVFs after IVF bolus F/u hemodynamic status  Postural dizziness with presyncope: Likely secondary to orthostatic hypotension in setting of Parkinsons  Continue on IVFs and increased dose of midodrine today b/c pt is still orthostatic. Repeat orthostatics today Consider adding florinef if still orthostatic after increase in midodrine dose    Syncope: Recurrent syncope in the setting of [name]'s disease, orthostatic hypotension, gait instability Continue on IVFs and increased dose of midodrine today as orthostatic vitals are still positive  Titrate medication regimen   Physical debility:  Likely secondary to Parkinsons  PT/OT recs SNF.  Fall precautions  CAD (coronary artery disease): No active chest symptoms. Continue on aspirin, zetia   GERD without esophagitis: continue PPI   Parkinsons: continue sinemet    Normocytic anemia: H&H are stable around 10.  No need for transfusion at this time F/u H and H      Subjective: Still reports dizziness, chills Denies chest symptoms, fever, abdominal symptoms, headache  RN report that pt is confused this AM  Physical Exam: Vitals:   07/13/23 1519 07/13/23 2323 07/14/23 0427 07/14/23 0742  BP: (!) 155/90 (!) 156/85  (!) 93/56  Pulse: 76 71  62  Resp: 18 18  16   Temp: 98.9 F (37.2 C)  (!) 97.4 F (36.3 C)  (!) 97.5 F (36.4 C)  TempSrc:      SpO2: 96% 97%  98%  Weight:   74.4 kg   Height:       Physical Exam Constitutional:      General: He is not in acute distress.    Appearance: Normal appearance. He is not ill-appearing.  HENT:     Head: Normocephalic and atraumatic.     Nose: Nose normal. No congestion.     Mouth/Throat:     Mouth: Mucous membranes are moist.     Pharynx: Oropharynx is clear. No oropharyngeal exudate.  Eyes:     Extraocular Movements: Extraocular movements intact.     Conjunctiva/sclera: Conjunctivae normal.     Pupils: Pupils are equal, round, and reactive to light.  Cardiovascular:     Rate and Rhythm: Normal rate and regular rhythm.     Pulses: Normal pulses.     Heart sounds: Normal heart sounds. No murmur heard. Pulmonary:     Effort: Pulmonary effort is normal. No respiratory distress.     Breath sounds: Normal breath sounds.  Abdominal:     General: Abdomen is flat. Bowel sounds are normal.     Palpations: Abdomen is soft.  Musculoskeletal:        General: No swelling or tenderness. Normal range of motion.     Cervical back: Normal range of motion and neck supple.     Right lower leg: No edema.     Left lower leg: No edema.  Skin:    General: Skin is warm.     Capillary Refill: Capillary refill takes 2  to 3 seconds.  Neurological:     General: No focal deficit present.     Mental Status: He is alert. Mental status is at baseline.     Cranial Nerves: No cranial nerve deficit.     Sensory: No sensory deficit.     Motor: No weakness.  Psychiatric:        Mood and Affect: Mood normal.        Behavior: Behavior normal.     Data Reviewed:  Latest Reference Range & Units 07/13/23 03:22  Sodium 135 - 145 mmol/L 139  Potassium 3.5 - 5.1 mmol/L 3.9  Chloride 98 - 111 mmol/L 109  CO2 22 - 32 mmol/L 23  Glucose 70 - 99 mg/dL 96  BUN 8 - 23 mg/dL 18  Creatinine 0.27 - 2.53 mg/dL 6.64  Calcium 8.9 - 40.3 mg/dL 8.4 (L)   Anion gap 5 - 15  7  GFR, Estimated >60 mL/min >60  WBC 4.0 - 10.5 K/uL 10.7 (H)  RBC 4.22 - 5.81 MIL/uL 3.43 (L)  Hemoglobin 13.0 - 17.0 g/dL 47.4 (L)  HCT 25.9 - 56.3 % 30.7 (L)  MCV 80.0 - 100.0 fL 89.5  MCH 26.0 - 34.0 pg 30.0  MCHC 30.0 - 36.0 g/dL 87.5  RDW 64.3 - 32.9 % 13.0  Platelets 150 - 400 K/uL 139 (L)  nRBC 0.0 - 0.2 % 0.0  (L): Data is abnormally low (H): Data is abnormally high  Family Communication: none  Disposition: Status is: Inpatient   Planned Discharge Destination: Skilled nursing facility    Time spent: 50 minutes  Author: Ernestene Mention, MD 07/14/2023 11:53 AM  For on call review [email or web address].

## 2023-07-14 NOTE — Progress Notes (Signed)
Occupational Therapy Treatment Patient Details Name: Corey Morrison MRN: [id number] DOB: 1956/01/30 Today's Date: 07/14/2023   History of present illness Pt is a 67 y.o. male with history of [name]'s disease, paroxysmal atrial fibrillation, CAD, and recurrent falls/orthostasis presenting with syncope.   OT comments  Pt seen for OT tx, friend present. RN cleared for bed level session 2/2 low BP. Pt agreeable. Pt demonstrating difficulty with sequencing for bed mobility to boost up in the bed. Pt required hand over hand assist to place pt's hands on bed rails and 1 step commands for sequencing to improve effective participation, ultimately requiring MAX A. Once in chair position, pt able to complete grooming task with setup and 1 VC to initiate. Will continue to progress as able.       If plan is discharge home, recommend the following:  A lot of help with walking and/or transfers;A lot of help with bathing/dressing/bathroom;Assistance with cooking/housework;Assist for transportation;Help with stairs or ramp for entrance;Direct supervision/assist for medications management;Direct supervision/assist for financial management   Equipment Recommendations  BSC/3in1    Recommendations for Other Services      Precautions / Restrictions Precautions Precautions: Fall Precaution Comments: monitor orthostatics Restrictions Weight Bearing Restrictions: No       Mobility Bed Mobility Overal bed mobility: Needs Assistance             General bed mobility comments: MAX A for boosting up in bed with MAX simple VC for sequencing to improve pt's participation with use of bed rails (physically placing pt's hands on bed rails) for boosting up in the bed.    Transfers                   General transfer comment: deferred per RN 2/2 low BP     Balance                                           ADL either performed or assessed with clinical judgement   ADL Overall  ADL's : Needs assistance/impaired     Grooming: Cueing for sequencing;Wash/dry face;Set up;Supervision/safety;Sitting Grooming Details (indicate cue type and reason): chair position in bed, pt provided with washcloth and 1 VC with pt able to fully wash his face without additional cues to complete.                                    Extremity/Trunk Assessment              Vision       Perception     Praxis      Cognition Arousal: Alert Behavior During Therapy: Flat affect Overall Cognitive Status: No family/caregiver present to determine baseline cognitive functioning                                 General Comments: Pt having difficulty expressing how he was feeling, able to follow commands during grooming but demonstrated difficulty with sequencing and following 1 step commands during bed mobility.        Exercises      Shoulder Instructions       General Comments      Pertinent Vitals/ Pain       Pain Assessment Pain Assessment: No/denies pain  Home Living                                          Prior Functioning/Environment              Frequency  Min 1X/week        Progress Toward Goals  OT Goals(current goals can now be found in the care plan section)  Progress towards OT goals: Progressing toward goals  Acute Rehab OT Goals Patient Stated Goal: go back to ALF OT Goal Formulation: With patient Time For Goal Achievement: 07/25/23 Potential to Achieve Goals: Good  Plan      Co-evaluation                 AM-PAC OT "6 Clicks" Daily Activity     Outcome Measure   Help from another person eating meals?: None Help from another person taking care of personal grooming?: A Little Help from another person toileting, which includes using toliet, bedpan, or urinal?: A Lot Help from another person bathing (including washing, rinsing, drying)?: A Lot Help from another person to put on and  taking off regular upper body clothing?: A Little Help from another person to put on and taking off regular lower body clothing?: A Lot 6 Click Score: 16    End of Session    OT Visit Diagnosis: Other abnormalities of gait and mobility (R26.89);History of falling (Z91.81);Muscle weakness (generalized) (M62.81)   Activity Tolerance Patient tolerated treatment well   Patient Left in bed;with call bell/phone within reach;with bed alarm set;with nursing/sitter in room   Nurse Communication Mobility status        Time: 4098-1191 OT Time Calculation (min): 18 min  Charges: OT General Charges $OT Visit: 1 Visit OT Treatments $Self Care/Home Management : 8-22 mins  Arman Filter., MPH, MS, OTR/L ascom [phone] 07/14/23, 1:19 PM

## 2023-07-14 NOTE — Care Management Important Message (Signed)
Important Message  Patient Details  Name: Corey Morrison MRN: [id number] Date of Birth: Dec 25, 1955   Medicare Important Message Given:     I contacted the patient's POA Corey Morrison by phone [phone]) to review the Important Message from Medicare with him (he started coughing and asked that I talk with his wife, Corey Morrison). I reviewed it with her and she said he didn't have a copy so I asked if I could email it and she replied yes. I sent via secure email to: [email or web address] and thanked her for their time. Olegario Messier A Lagina Reader 07/14/2023, 2:32 PM

## 2023-07-14 NOTE — Plan of Care (Signed)

## 2023-07-15 DIAGNOSIS — R42 Dizziness and giddiness: Secondary | ICD-10-CM | POA: Diagnosis not present

## 2023-07-15 DIAGNOSIS — K219 Gastro-esophageal reflux disease without esophagitis: Secondary | ICD-10-CM | POA: Insufficient documentation

## 2023-07-15 DIAGNOSIS — A419 Sepsis, unspecified organism: Secondary | ICD-10-CM | POA: Insufficient documentation

## 2023-07-15 DIAGNOSIS — I2510 Atherosclerotic heart disease of native coronary artery without angina pectoris: Secondary | ICD-10-CM | POA: Insufficient documentation

## 2023-07-15 DIAGNOSIS — D6869 Other thrombophilia: Secondary | ICD-10-CM | POA: Insufficient documentation

## 2023-07-15 DIAGNOSIS — R55 Syncope and collapse: Secondary | ICD-10-CM | POA: Diagnosis not present

## 2023-07-15 LAB — BASIC METABOLIC PANEL
Anion gap: 6 (ref 5–15)
BUN: 15 mg/dL (ref 8–23)
CO2: 22 mmol/L (ref 22–32)
Calcium: 8.4 mg/dL — ABNORMAL LOW (ref 8.9–10.3)
Chloride: 110 mmol/L (ref 98–111)
Creatinine, Ser: 0.82 mg/dL (ref 0.61–1.24)
GFR, Estimated: >60 mL/min (ref >60)
Glucose, Bld: 92 mg/dL (ref 70–99)
Potassium: 3.7 mmol/L (ref 3.5–5.1)
Sodium: 138 mmol/L (ref 135–145)

## 2023-07-15 LAB — CBC
HCT: 28.1 % — ABNORMAL LOW (ref 39.0–52.0)
Hemoglobin: 9.5 g/dL — ABNORMAL LOW (ref 13.0–17.0)
MCH: 30 pg (ref 26.0–34.0)
MCHC: 33.8 g/dL (ref 30.0–36.0)
MCV: 88.6 fL (ref 80.0–100.0)
Platelets: 137 10*3/uL — ABNORMAL LOW (ref 150–400)
RBC: 3.17 MIL/uL — ABNORMAL LOW (ref 4.22–5.81)
RDW: 13.1 % (ref 11.5–15.5)
WBC: 8.8 10*3/uL (ref 4.0–10.5)
nRBC: 0 % (ref 0.0–0.2)

## 2023-07-15 LAB — GLUCOSE, CAPILLARY: Glucose-Capillary: 131 mg/dL — ABNORMAL HIGH (ref 70–99)

## 2023-07-15 NOTE — Progress Notes (Signed)
PT Cancellation Note  Patient Details Name: Adon Senn MRN: [id number] DOB: 11-23-1956   Cancelled Treatment:    PT attempted to treat pt 2 x this date. First attempt pt had just returned to bed after sitting in recliner x ~ 2.5 hours. He requested therapist return. When author returned, pt remains unwilling but agrees to future PT sessions. Discussed STR at DC and POC going forward. Confirmed with RN staff that pt has been OOB earlier in the day.     Rushie Chestnut 07/15/2023, 5:09 PM

## 2023-07-15 NOTE — Assessment & Plan Note (Signed)
  Secondary hypercoagulable state due to the ambulatory dysfunction/immobility as well as paroxysmal atrial fibrillation Has [name]'s induced orthostatic hypotension limiting mobility Continue enoxaparin for DVT prophylaxis, needs outpatient review for why he is not on chronic anticoagulation likely secondary to his falls

## 2023-07-15 NOTE — Progress Notes (Signed)
Progress Note   Patient: Corey Morrison VWU:[id number] DOB: 1956/05/14 DOA: 07/10/2023     4 DOS: the patient was seen and examined on 07/15/2023   Brief hospital course:  67 year old gentleman with a past medical history of paroxysmal atrial fibrillation coronary artery disease [name]'s disease complicated by recurrent orthostatic hypotension and ambulatory dysfunction managed inpatient with the addition of midodrine receiving a.m. cortisol level as well as fludrocortisone treatment given persistent symptoms.  Assessment and Plan:   Orthostatic hypotension Orthostatic hypotension Suspect primarily autonomic sequelae of [name]'s disease Blood pressure has ranged from systolic blood pressure 90s to 140s in the last 24 hours Patient does report episodes of recurrent dizziness, collateral from nursing staff as he is symptomatically improved Plan: Continue midodrine 10 mg 3 times daily, a.m. cortisol ordered and plan on starting fludrocortisone after a.m. cortisol is collected  Ambulatory dysfunction  Ambulatory dysfunction Secondary to his orthostatic hypotension of note reviewed OT records were the patient required maximum assistance as well as the placement plans without short-term rehab   [name]'s disease [name]'s disease Continue carbidopa levodopa 25 over 100 mg 3 tablets at 6 AM otherwise 5 times daily   Paroxysmal atrial fibrillation (HCC)  Paroxysmal atrial fibrillation Currently in sinus we will continue to monitor   Normocytic anemia Anemia, stable Hemoglobin is 9.5, normocytic Outpatient follow-up  Secondary hypercoagulability disorder (HCC)  Secondary hypercoagulable state due to the ambulatory dysfunction/immobility as well as paroxysmal atrial fibrillation Has [name]'s induced orthostatic hypotension limiting mobility Continue enoxaparin for DVT prophylaxis, needs outpatient review for why he is not on chronic anticoagulation likely secondary to his  falls   Sepsis (HCC)  Sepsis, suspected, to rule out pending further information Blood cultures 2 sets pending no growth to date, urinalysis was unremarkable, chest x-ray was clear WBC 8.8, Tmax 99.6 Continue ceftriaxone pending blood culture results, if remains afebrile suspect we can de-escalate antibiotics in the a.m.   GERD (gastroesophageal reflux disease)  Insomnia Continue sertraline 150 mg at night   Coronary artery disease  Coronary artery disease No chest pain, continue aspirin  Insomnia  Insomnia Continue sertraline 150 mg at night       Subjective:  Patient reports his symptoms have slightly improved although he is significantly limited in any universal ambulation due to ongoing orthostatic hypotension and presyncope OT yesterday required max assist although denies any fever chills chest pain or palpitations  Physical Exam: Vitals:   07/14/23 1534 07/14/23 2319 07/15/23 0424 07/15/23 0734  BP: 109/70 (!) 141/90  115/67  Pulse: 66 66  (!) 54  Resp: 17 18  16   Temp: 99.6 F (37.6 C) 97.6 F (36.4 C)  98.2 F (36.8 C)  TempSrc:      SpO2: 100% 97%  99%  Weight:   74.5 kg   Height:      Constitutional:  Vital Signs as per [facility] than three noted] No Acute Distress Eyes:  Pink Conjunctiva and no Ptosis Neck:     Trachea Midline, Neck Symmetric             Thyroid without tenderness, palpable masses or nodules Respiratory:   Respiratory Effort Normal: No Use of Respiratory Muscles,No  Intercostal Retractions             Lungs Clear to Auscultation Bilaterally Cardiovascular:   Heart Auscultated: Regular Regular without any added sounds or murmurs              No Lower Extremity Edema Gastrointestinal:  Abdomen soft and  nontender without palpable masses, guarding or rebound  No Palpable Splenomegaly or Hepatomegaly Psychiatric:  Patient Orientated to Time, Place and Person Patient with appropriate mood and affect Recent and Remote Memory  Intact    Data Reviewed: Labs as per A/P  Family Communication: N/A  Disposition: Status is: Inpatient Remains inpatient appropriate because: Patient still has symptomatic orthostatic hypotension  Planned Discharge Destination: Long Term Care    Time spent: 35 minutes  Author: Princess Bruins, MD 07/15/2023 3:10 PM  For on call review [email or web address].

## 2023-07-15 NOTE — Assessment & Plan Note (Signed)
[name]'s disease Continue carbidopa levodopa 25 over 100 mg 3 tablets at 6 AM otherwise 5 times daily

## 2023-07-15 NOTE — Assessment & Plan Note (Signed)
  Ambulatory dysfunction Secondary to his orthostatic hypotension of note reviewed OT records were the patient required maximum assistance as well as the placement plans without short-term rehab

## 2023-07-15 NOTE — Assessment & Plan Note (Signed)
  Paroxysmal atrial fibrillation Currently in sinus we will continue to monitor

## 2023-07-15 NOTE — Assessment & Plan Note (Signed)
  Insomnia Continue sertraline 150 mg at night

## 2023-07-15 NOTE — Plan of Care (Signed)
  Problem: Cardiac: Goal: Will achieve and/or maintain adequate cardiac output Outcome: Progressing   Problem: Physical Regulation: Goal: Complications related to the disease process, condition or treatment will be avoided or minimized Outcome: Progressing   Problem: Clinical Measurements: Goal: Ability to maintain clinical measurements within normal limits will improve Outcome: Progressing Goal: Will remain free from infection Outcome: Progressing Goal: Diagnostic test results will improve Outcome: Progressing   Problem: Elimination: Goal: Will not experience complications related to bowel motility Outcome: Progressing Goal: Will not experience complications related to urinary retention Outcome: Progressing   Problem: Pain Managment: Goal: General experience of comfort will improve Outcome: Progressing   Problem: Safety: Goal: Ability to remain free from injury will improve Outcome: Progressing   Problem: Skin Integrity: Goal: Risk for impaired skin integrity will decrease Outcome: Progressing

## 2023-07-15 NOTE — Assessment & Plan Note (Signed)
  Sepsis, suspected, to rule out pending further information Blood cultures 2 sets pending no growth to date, urinalysis was unremarkable, chest x-ray was clear WBC 8.8, Tmax 99.6 Continue ceftriaxone pending blood culture results, if remains afebrile suspect we can de-escalate antibiotics in the a.m.

## 2023-07-15 NOTE — Assessment & Plan Note (Signed)
Anemia, stable Hemoglobin is 9.5, normocytic Outpatient follow-up

## 2023-07-15 NOTE — Assessment & Plan Note (Signed)
Orthostatic hypotension Suspect primarily autonomic sequelae of [name]'s disease Blood pressure has ranged from systolic blood pressure 90s to 140s in the last 24 hours Patient does report episodes of recurrent dizziness, collateral from nursing staff as he is symptomatically improved Plan: Continue midodrine 10 mg 3 times daily, a.m. cortisol ordered and plan on starting fludrocortisone after a.m. cortisol is collected

## 2023-07-15 NOTE — Assessment & Plan Note (Signed)
  Coronary artery disease No chest pain, continue aspirin

## 2023-07-16 DIAGNOSIS — R55 Syncope and collapse: Secondary | ICD-10-CM | POA: Diagnosis not present

## 2023-07-16 DIAGNOSIS — R42 Dizziness and giddiness: Secondary | ICD-10-CM | POA: Diagnosis not present

## 2023-07-16 LAB — CBC
HCT: 27.8 % — ABNORMAL LOW (ref 39.0–52.0)
Hemoglobin: 9.3 g/dL — ABNORMAL LOW (ref 13.0–17.0)
MCH: 30 pg (ref 26.0–34.0)
MCHC: 33.5 g/dL (ref 30.0–36.0)
MCV: 89.7 fL (ref 80.0–100.0)
Platelets: 144 10*3/uL — ABNORMAL LOW (ref 150–400)
RBC: 3.1 MIL/uL — ABNORMAL LOW (ref 4.22–5.81)
RDW: 13 % (ref 11.5–15.5)
WBC: 9.1 10*3/uL (ref 4.0–10.5)
nRBC: 0 % (ref 0.0–0.2)

## 2023-07-16 LAB — CORTISOL-AM, BLOOD: Cortisol - AM: 9.1 ug/dL (ref 6.7–22.6)

## 2023-07-16 MED ORDER — LORAZEPAM 2 MG/ML IJ SOLN
1.0000 mg | Freq: Once | INTRAMUSCULAR | Status: AC
  Administered 2023-07-16: 1 mg via INTRAVENOUS
  Filled 2023-07-16: qty 1

## 2023-07-16 MED ORDER — SODIUM CHLORIDE 0.9 % IV BOLUS
500.0000 mL | Freq: Once | INTRAVENOUS | Status: AC
  Administered 2023-07-16: 500 mL via INTRAVENOUS

## 2023-07-16 NOTE — Progress Notes (Signed)
Physical Therapy Treatment Patient Details Name: Corey Morrison MRN: [id number] DOB: May 26, 1956 Today's Date: 07/16/2023   History of Present Illness Pt is a 67 y.o. male with history of [name]'s disease, paroxysmal atrial fibrillation, CAD, and recurrent falls/orthostasis presenting with syncope.    PT Comments  Patient was agreeable to PT and wanted to try walking. Unable to safely attempt walking due to decreased blood pressure with mobility and dizziness reported. Moderate assistance for stand pivot transfers. Recommend to continue PT to maximize independence to facilitate return to prior level of function.   BP supine- 115/57, sitting- 87/47, unsafe to stand long enough for standing BP   If plan is discharge home, recommend the following: Two people to help with walking and/or transfers;Two people to help with bathing/dressing/bathroom;Direct supervision/assist for medications management;Help with stairs or ramp for entrance;Assist for transportation;Assistance with cooking/housework;Supervision due to cognitive status   Can travel by private vehicle     No  Equipment Recommendations  None recommended by PT    Recommendations for Other Services       Precautions / Restrictions Precautions Precautions: Fall Restrictions Weight Bearing Restrictions: No     Mobility  Bed Mobility Overal bed mobility: Needs Assistance Bed Mobility: Supine to Sit, Sit to Supine     Supine to sit: Mod assist Sit to supine: Mod assist   General bed mobility comments: increased time required. verbal cues for task initiation and sequencing    Transfers Overall transfer level: Needs assistance Equipment used: Rolling walker (2 wheels) Transfers: Bed to chair/wheelchair/BSC     Step pivot transfers: Mod assist       General transfer comment: verbal cues for technique. patient performed step pivot to chair and then back to bed after sitting up for ~ 5 minutes. patient reports dizziness  with mobility and blood pressure monitored throughout session    Ambulation/Gait               General Gait Details: unsafe to attempt due to blood pressure, dizziness   Stairs             Wheelchair Mobility     Tilt Bed    Modified Rankin (Stroke Patients Only)       Balance Overall balance assessment: Needs assistance Sitting-balance support: Feet supported, Bilateral upper extremity supported, No upper extremity supported Sitting balance-Leahy Scale: Fair     Standing balance support: Bilateral upper extremity supported, Reliant on assistive device for balance Standing balance-Leahy Scale: Poor Standing balance comment: external support provided for safety using rolling walker for support. occasional posterior lean                            Cognition Arousal: Alert Behavior During Therapy: Flat affect Overall Cognitive Status: No family/caregiver present to determine baseline cognitive functioning                                          Exercises      General Comments General comments (skin integrity, edema, etc.): blood pressure 115/57 before mobilizing, 87/47 while sitting, and 112/52 after return to bed.      Pertinent Vitals/Pain Pain Assessment Pain Assessment: No/denies pain    Home Living                          Prior  Function            PT Goals (current goals can now be found in the care plan section) Acute Rehab PT Goals Patient Stated Goal: to get better PT Goal Formulation: With patient Time For Goal Achievement: 07/26/23 Potential to Achieve Goals: Fair Progress towards PT goals: Progressing toward goals    Frequency    Min 1X/week      PT Plan      Co-evaluation              AM-PAC PT "6 Clicks" Mobility   Outcome Measure  Help needed turning from your back to your side while in a flat bed without using bedrails?: A Lot Help needed moving from lying on your back  to sitting on the side of a flat bed without using bedrails?: A Lot Help needed moving to and from a bed to a chair (including a wheelchair)?: A Lot Help needed standing up from a chair using your arms (e.g., wheelchair or bedside chair)?: A Lot Help needed to walk in hospital room?: A Lot Help needed climbing 3-5 steps with a railing? : Total 6 Click Score: 11    End of Session   Activity Tolerance: Patient limited by fatigue Patient left: in bed;with call bell/phone within reach;with bed alarm set Nurse Communication: Mobility status PT Visit Diagnosis: Other abnormalities of gait and mobility (R26.89);Difficulty in walking, not elsewhere classified (R26.2);Muscle weakness (generalized) (M62.81)     Time: 4010-2725 PT Time Calculation (min) (ACUTE ONLY): 27 min  Charges:    $Therapeutic Activity: 23-37 mins PT General Charges $$ ACUTE PT VISIT: 1 Visit                    Donna Bernard, PT, MPT   Ina Homes 07/16/2023, 2:55 PM

## 2023-07-16 NOTE — Plan of Care (Signed)

## 2023-07-16 NOTE — Progress Notes (Signed)
Progress Note   Patient: Corey Morrison ZOX:[id number] DOB: [birth year] DOA: 07/10/2023     5 DOS: the patient was seen and examined on 07/16/2023   Brief hospital course: 67 y.o. male with medical history significant of atrial fibrillation, coronary disease, [name] disease, recurrent falls/orthostasis presenting with syncope. Still orthostatic, so increased midodrine. Consider adding florinef if still orthostatic after increase in midodrine dose   8/18 : Patient was agitated overnight pulling his IVs and ripping his close off.  Continues to stay symptomatic with hypotension, midodrine continued.  Patient has no history of congestive heart failure, was given bolus to maintain a MAP of 65  Assessment and Plan: Acute encephalopathy, Hypotension (93/56) and hypothermia: ? sepsis BP is low,  Obtain UA, CBC, CMP, LA, Blood cx, CXR Start IVF bolus 1L Start IV Rocephin empirically  Continuous IVFs after IVF bolus F/u hemodynamic status  Postural dizziness with presyncope: Likely secondary to orthostatic hypotension in setting of Parkinsons  Continue on IVFs and increased dose of midodrine today b/c pt is still orthostatic. Repeat orthostatics today Consider adding florinef if still orthostatic, is on midodrine 10 mg 3 times daily   Syncope: Recurrent syncope in the setting of [name]'s disease, orthostatic hypotension, gait instability Continue on IVFs and increased dose of midodrine today as orthostatic vitals are still positive  Titrate medication regimen   Physical debility:  Likely secondary to Parkinsons  PT/OT recs SNF.  Fall precautions  CAD (coronary artery disease): No active chest symptoms. Continue on aspirin, zetia   GERD without esophagitis: continue PPI   Parkinsons: continue sinemet, avoid antipsychotics for agitation   Normocytic anemia: H&H are stable around 10.  No need for transfusion at this time F/u H and H      Subjective: Still dizzy when getting out of bed.   No complaint of chest pain or diaphoresis.  No complaint of shortness of breath.  Patient was agitated overnight ripped his IV off.  Received 1 dose of Ativan for agitation.  Avoid antipsychotic agents as patient has history of [name]'s  Physical Exam: Vitals:   07/15/23 0734 07/15/23 1541 07/16/23 0025 07/16/23 0852  BP: 115/67 (!) 164/78 (!) 109/57 (!) 109/43  Pulse: (!) 54 71 61 65  Resp: 16 16 16 18   Temp: 98.2 F (36.8 C) 97.8 F (36.6 C) 98.4 F (36.9 C) 97.7 F (36.5 C)  TempSrc:      SpO2: 99% 99% 94% 99%  Weight:      Height:       Physical Exam Constitutional:      General: He is not in acute distress.    Appearance: Normal appearance. He is not ill-appearing.  HENT:     Head: Normocephalic and atraumatic.     Nose: Nose normal. No congestion.     Mouth/Throat:     Mouth: Mucous membranes are moist.     Pharynx: Oropharynx is clear. No oropharyngeal exudate.  Eyes:     Extraocular Movements: Extraocular movements intact.     Conjunctiva/sclera: Conjunctivae normal.     Pupils: Pupils are equal, round, and reactive to light.  Cardiovascular:     Rate and Rhythm: Normal rate and regular rhythm.     Pulses: Normal pulses.     Heart sounds: Normal heart sounds. No murmur heard. Pulmonary:     Effort: Pulmonary effort is normal. No respiratory distress.     Breath sounds: Normal breath sounds.  Abdominal:     General: Abdomen is flat. Bowel sounds are  normal.     Palpations: Abdomen is soft.  Musculoskeletal:        General: No swelling or tenderness. Normal range of motion.     Cervical back: Normal range of motion and neck supple.     Right lower leg: No edema.     Left lower leg: No edema.  Skin:    General: Skin is warm.     Capillary Refill: Capillary refill takes 2 to 3 seconds.  Neurological:     General: No focal deficit present.     Mental Status: He is alert. Mental status is at baseline.     Cranial Nerves: No cranial nerve deficit.      Sensory: No sensory deficit.     Motor: No weakness.  Psychiatric:        Mood and Affect: Mood normal.        Behavior: Behavior normal.     Data Reviewed:  Latest Reference Range & Units 07/13/23 03:22  Sodium 135 - 145 mmol/L 139  Potassium 3.5 - 5.1 mmol/L 3.9  Chloride 98 - 111 mmol/L 109  CO2 22 - 32 mmol/L 23  Glucose 70 - 99 mg/dL 96  BUN 8 - 23 mg/dL 18  Creatinine 2.95 - 1.88 mg/dL 4.16  Calcium 8.9 - 60.6 mg/dL 8.4 (L)  Anion gap 5 - 15  7  GFR, Estimated >60 mL/min >60  WBC 4.0 - 10.5 K/uL 10.7 (H)  RBC 4.22 - 5.81 MIL/uL 3.43 (L)  Hemoglobin 13.0 - 17.0 g/dL 30.1 (L)  HCT 60.1 - 09.3 % 30.7 (L)  MCV 80.0 - 100.0 fL 89.5  MCH 26.0 - 34.0 pg 30.0  MCHC 30.0 - 36.0 g/dL 23.5  RDW 57.3 - 22.0 % 13.0  Platelets 150 - 400 K/uL 139 (L)  nRBC 0.0 - 0.2 % 0.0  (L): Data is abnormally low (H): Data is abnormally high  Family Communication: none  Disposition: Status is: Inpatient   Planned Discharge Destination: Skilled nursing facility    Time spent: 50 minutes  Author: Kirstie Peri, MD 07/16/2023 2:14 PM  For on call review [email or web address].

## 2023-07-16 NOTE — Plan of Care (Signed)
Patient confused and oriented to name only. Unable to provide education. Continent of bowels and wearing male purewick with no urinary issues.   Problem: Elimination: Goal: Will not experience complications related to bowel motility Outcome: Progressing Goal: Will not experience complications related to urinary retention Outcome: Progressing   Problem: Safety: Goal: Ability to remain free from injury will improve Outcome: Progressing   Problem: Skin Integrity: Goal: Risk for impaired skin integrity will decrease Outcome: Progressing

## 2023-07-17 DIAGNOSIS — D649 Anemia, unspecified: Secondary | ICD-10-CM

## 2023-07-17 DIAGNOSIS — I480 Paroxysmal atrial fibrillation: Secondary | ICD-10-CM

## 2023-07-17 DIAGNOSIS — R42 Dizziness and giddiness: Secondary | ICD-10-CM | POA: Diagnosis not present

## 2023-07-17 DIAGNOSIS — K219 Gastro-esophageal reflux disease without esophagitis: Secondary | ICD-10-CM

## 2023-07-17 DIAGNOSIS — R262 Difficulty in walking, not elsewhere classified: Secondary | ICD-10-CM

## 2023-07-17 DIAGNOSIS — G20A1 Parkinson's disease without dyskinesia, without mention of fluctuations: Secondary | ICD-10-CM | POA: Diagnosis not present

## 2023-07-17 DIAGNOSIS — D6869 Other thrombophilia: Secondary | ICD-10-CM

## 2023-07-17 DIAGNOSIS — I951 Orthostatic hypotension: Secondary | ICD-10-CM | POA: Diagnosis not present

## 2023-07-17 LAB — GLUCOSE, CAPILLARY: Glucose-Capillary: 75 mg/dL (ref 70–99)

## 2023-07-17 MED ORDER — MELATONIN 5 MG PO TABS
5.0000 mg | ORAL_TABLET | Freq: Every day | ORAL | Status: DC
  Administered 2023-07-17: 5 mg via ORAL
  Filled 2023-07-17: qty 1

## 2023-07-17 NOTE — Progress Notes (Signed)
Progress Note   Patient: Corey Morrison AVW:[id number] DOB: Oct 10, 1956 DOA: 07/10/2023     6 DOS: the patient was seen and examined on 07/17/2023   Brief hospital course: 67 y.o. male with medical history significant of atrial fibrillation, coronary disease, [name] disease, recurrent falls/orthostasis presenting with syncope. Still orthostatic, so increased midodrine to 10 mg 3 times daily.  Plan to add florinef if still orthostatic after increase in midodrine dose.  Patient is dizzy while getting out of bed.  Orthostatics negative.  Patient is evaluated by PT OT who recommended SNF placement.  Assessment and Plan: * Postural dizziness with presyncope- Acute flare likely due to dehydration versus [name]'s Gentle IV fluids. Encourage oral diet, supplements PT OT follow-up  Orthostatic hypotension Suspect primarily autonomic sequelae of [name]'s disease Blood pressure has ranged from systolic blood pressure 90s to 140s in the last 24 hours Continue midodrine 10 mg 3 times daily, a.m. cortisol ordered and plan on starting fludrocortisone after a.m. cortisol is collected  Ambulatory dysfunction Evaluated by PT OT who recommended SNF placement.  [name]'s disease Continue carbidopa levodopa 25 over 100 mg 3 tablets at 6 AM otherwise 5 times daily  Paroxysmal atrial fibrillation (HCC) Currently in sinus we will continue telemetry monitoring  CAD (coronary artery disease)- No active CP  Cont home regimen   Normocytic anemia Anemia, stable Hemoglobin is 9.5, normocytic Outpatient follow-up  Secondary hypercoagulability disorder (HCC) Due to the ambulatory dysfunction/immobility as well as paroxysmal atrial fibrillation Has [name]'s induced orthostatic hypotension limiting mobility Continue enoxaparin for DVT prophylaxis, needs outpatient review for why he is not on chronic anticoagulation likely secondary to his falls  Sepsis (HCC) Ruled out. Blood cultures 2 sets  pending no growth to date, urinalysis was unremarkable, chest x-ray was clear WBC 8.8, Tmax 99.6 Continue ceftriaxone pending blood culture results, if remains afebrile suspect we can de-escalate antibiotics in the a.m.  GERD (gastroesophageal reflux disease) On PPI.      Subjective: Patient is seen and examined today morning.  He is lying in the bed, has mittens.  RN noted that he is not study for walking.  Unable to provide me any history.  Physical Exam: Vitals:   07/17/23 0053 07/17/23 0207 07/17/23 0833 07/17/23 1635  BP: (!) 151/89  (!) 103/46 (!) 153/83  Pulse: 70  68 68  Resp: 20  18 18   Temp: 98.5 F (36.9 C)  97.8 F (36.6 C) 97.9 F (36.6 C)  TempSrc:      SpO2: 97%  97% 98%  Weight:  67.2 kg    Height:       General - Elderly thin built Caucasian male, no apparent distress HEENT - PERRLA, EOMI, atraumatic head, non tender sinuses. Lung - Clear, rales, rhonchi, wheezes. Heart - S1, S2 heard, no murmurs, rubs, no pedal edema Neuro - Alert, awake, does not follow commands, unable to do full neuroexam exam. Skin - Warm and dry.  Hand tremors, mittens noted Data Reviewed:     Latest Ref Rng & Units 07/16/2023    5:25 AM 07/15/2023    5:22 AM 07/14/2023   12:09 PM  CBC  WBC 4.0 - 10.5 K/uL 9.1  8.8  9.5   Hemoglobin 13.0 - 17.0 g/dL 9.3  9.5  82.9   Hematocrit 39.0 - 52.0 % 27.8  28.1  30.4   Platelets 150 - 400 K/uL 144  137  141        Latest Ref Rng & Units 07/15/2023  5:22 AM 07/14/2023   12:09 PM 07/13/2023    3:22 AM  BMP  Glucose 70 - 99 mg/dL 92  83  96   BUN 8 - 23 mg/dL 15  16  18    Creatinine 0.61 - 1.24 mg/dL 1.61  0.96  0.45   Sodium 135 - 145 mmol/L 138  140  139   Potassium 3.5 - 5.1 mmol/L 3.7  4.1  3.9   Chloride 98 - 111 mmol/L 110  110  109   CO2 22 - 32 mmol/L 22  25  23    Calcium 8.9 - 10.3 mg/dL 8.4  8.5  8.4     Family Communication: He is from group home, need SNF for safe dc plan  Disposition: Status is: Inpatient Remains  inpatient appropriate because: unsteady gait, need SNF placement.  Planned Discharge Destination: Skilled nursing facility    Time spent: 44 minutes  Author: Marcelino Duster, MD 07/17/2023 5:13 PM  For on call review [email or web address].

## 2023-07-17 NOTE — Plan of Care (Signed)
  Problem: Nutrition: Goal: Adequate nutrition will be maintained Outcome: Progressing   Problem: Coping: Goal: Level of anxiety will decrease Outcome: Progressing   Problem: Pain Managment: Goal: General experience of comfort will improve Outcome: Progressing   Problem: Skin Integrity: Goal: Risk for impaired skin integrity will decrease Outcome: Progressing   Problem: Activity: Goal: Risk for activity intolerance will decrease Outcome: Progressing

## 2023-07-17 NOTE — TOC Progression Note (Signed)
Transition of Care [facility]) - Progression Note    Patient Details  Name: Corey Morrison MRN: [id number] Date of Birth: 1956/04/13  Transition of Care [facility]) CM/SW Contact  Margarito Liner, LCSW Phone Number: 07/17/2023, 10:40 AM  Clinical Narrative:   Patient is active with [facility] for PT. Liaison confirmed they can add OT.  Expected Discharge Plan: Skilled Nursing Facility Barriers to Discharge: Continued Medical Work up  Expected Discharge Plan and Services     Post Acute Care Choice: Skilled Nursing Facility Living arrangements for the past 2 months: Assisted Living Facility                                       Social Determinants of Health (SDOH) Interventions SDOH Screenings   Food Insecurity: No Food Insecurity (07/10/2023)  Housing: Patient Declined (07/10/2023)  Transportation Needs: No Transportation Needs (07/10/2023)  Utilities: Not At Risk (07/10/2023)  Social Connections: Unknown (01/24/2023)   Received from [facility]  Tobacco Use: Medium Risk (07/10/2023)   Received from [facility]    Readmission Risk Interventions    07/23/2022    4:25 PM  Readmission Risk Prevention Plan  Transportation Screening Complete  Medication Review (RN Care Manager) Complete  PCP or Specialist appointment within 3-5 days of discharge Complete  HRI or Home Care Consult Complete  SW Recovery Care/Counseling Consult Complete  Palliative Care Screening Complete  Skilled Nursing Facility Complete

## 2023-07-17 NOTE — Care Management Important Message (Signed)
Important Message  Patient Details  Name: Corey Morrison MRN: [id number] Date of Birth: 1956/04/08   Medicare Important Message Given:  Yes     Olegario Messier A Ladarrious Kirksey 07/17/2023, 1:59 PM

## 2023-07-17 NOTE — Plan of Care (Signed)
  Problem: Education: Goal: Knowledge of condition and prescribed therapy will improve Outcome: Not Progressing   Problem: Cardiac: Goal: Will achieve and/or maintain adequate cardiac output Outcome: Progressing   Problem: Health Behavior/Discharge Planning: Goal: Ability to manage health-related needs will improve Outcome: Not Progressing   Problem: Activity: Goal: Risk for activity intolerance will decrease Outcome: Not Progressing

## 2023-07-17 NOTE — Progress Notes (Signed)
Physical Therapy Treatment Patient Details Name: Corey Morrison MRN: [id number] DOB: [birth year] Today's Date: 07/17/2023   History of Present Illness Pt is a 67 y.o. male with history of [name]'s disease, paroxysmal atrial fibrillation, CAD, and recurrent falls/orthostasis presenting with syncope.    PT Comments  Awake.  BP supine 135/68 P 72.  Transitions to sitting with min a x 1.  Needs min a +1 at all times to remain upright today.  BP drops to 98.69 P 78 with sitting.  Pt endorsed dizziness stating "put me back down" and initiates return to supine.  Assisted with mod a x 1 for LE's and repositioning.  Mits reapplied after session and needs in reach.  RN aware of BP readings.   If plan is discharge home, recommend the following: Two people to help with walking and/or transfers;Two people to help with bathing/dressing/bathroom;Direct supervision/assist for medications management;Help with stairs or ramp for entrance;Assist for transportation;Assistance with cooking/housework;Supervision due to cognitive status   Can travel by private vehicle        Equipment Recommendations  None recommended by PT    Recommendations for Other Services       Precautions / Restrictions Precautions Precautions: Fall Precaution Comments: monitor orthostatics Restrictions Weight Bearing Restrictions: No     Mobility  Bed Mobility Overal bed mobility: Needs Assistance Bed Mobility: Supine to Sit, Sit to Supine Rolling: Min assist   Supine to sit: Mod assist          Transfers                   General transfer comment: deferred due to dizziness - "put me back down"    Ambulation/Gait                   Stairs             Wheelchair Mobility     Tilt Bed    Modified Rankin (Stroke Patients Only)       Balance Overall balance assessment: Needs assistance Sitting-balance support: Feet supported, Bilateral upper extremity supported, No upper extremity  supported Sitting balance-Leahy Scale: Poor Sitting balance - Comments: +1 assist to remain upright today       Standing balance comment: deferred                            Cognition Arousal: Alert Behavior During Therapy: Flat affect Overall Cognitive Status: No family/caregiver present to determine baseline cognitive functioning                                 General Comments: eyes open and generally appropriate this session        Exercises      General Comments        Pertinent Vitals/Pain Pain Assessment Pain Assessment: No/denies pain    Home Living                          Prior Function            PT Goals (current goals can now be found in the care plan section) Progress towards PT goals: Progressing toward goals    Frequency    Min 1X/week      PT Plan      Co-evaluation  AM-PAC PT "6 Clicks" Mobility   Outcome Measure  Help needed turning from your back to your side while in a flat bed without using bedrails?: A Lot Help needed moving from lying on your back to sitting on the side of a flat bed without using bedrails?: A Lot Help needed moving to and from a bed to a chair (including a wheelchair)?: A Lot Help needed standing up from a chair using your arms (e.g., wheelchair or bedside chair)?: A Lot Help needed to walk in hospital room?: A Lot Help needed climbing 3-5 steps with a railing? : Total 6 Click Score: 11    End of Session   Activity Tolerance: Treatment limited secondary to medical complications (Comment) Patient left: in bed;with call bell/phone within reach;with bed alarm set Nurse Communication: Mobility status PT Visit Diagnosis: Other abnormalities of gait and mobility (R26.89);Difficulty in walking, not elsewhere classified (R26.2);Muscle weakness (generalized) (M62.81)     Time: 9604-5409 PT Time Calculation (min) (ACUTE ONLY): 12 min  Charges:     $Therapeutic Activity: 8-22 mins PT General Charges $$ ACUTE PT VISIT: 1 Visit                   Danielle Dess, PTA 07/17/23, 3:00 PM

## 2023-07-17 NOTE — Progress Notes (Signed)
Occupational Therapy Treatment Patient Details Name: Armando Mcevoy MRN: [id number] DOB: 05-06-1956 Today's Date: 07/17/2023   History of present illness Pt is a 67 y.o. male with history of [name]'s disease, paroxysmal atrial fibrillation, CAD, and recurrent falls/orthostasis presenting with syncope.   OT comments  Pt seen for OT tx this date. Progress limited by pt's alertness. Pt kept eyes closed throughout, with VC/TC pt will briefly respond verbally to simple questions/commands intermittenly but unable to sustain attention or alertness. Pt required near TOTAL A for repositioning in bed for improved joint protection, pressure relief, and comfort. NT in to replace male purewick system as previous had become malpositioned. Further tx deferred at this time. Will continue to progress as able.        If plan is discharge home, recommend the following:  A lot of help with walking and/or transfers;A lot of help with bathing/dressing/bathroom;Assistance with cooking/housework;Assist for transportation;Help with stairs or ramp for entrance;Direct supervision/assist for medications management;Direct supervision/assist for financial management   Equipment Recommendations  BSC/3in1    Recommendations for Other Services      Precautions / Restrictions Precautions Precautions: Fall Precaution Comments: monitor orthostatics Restrictions Weight Bearing Restrictions: No       Mobility Bed Mobility Overal bed mobility: Needs Assistance             General bed mobility comments: MAX A-TOTAL A for boosting up in bed and repositioning    Transfers                         Balance                                           ADL either performed or assessed with clinical judgement   ADL                                              Extremity/Trunk Assessment              Vision       Perception     Praxis      Cognition  Arousal: Obtunded Behavior During Therapy: Flat affect Overall Cognitive Status: No family/caregiver present to determine baseline cognitive functioning                                 General Comments: Pt keeps eyes closed throughout, with VC/TC pt will briefly respond verbally to simple questions/commands intermittenly but unable to sustain        Exercises      Shoulder Instructions       General Comments      Pertinent Vitals/ Pain       Pain Assessment Pain Assessment: Faces Faces Pain Scale: No hurt  Home Living                                          Prior Functioning/Environment              Frequency  Min 1X/week        Progress Toward Goals  OT Goals(current  goals can now be found in the care plan section)  Progress towards OT goals: Not progressing toward goals - comment (too lethargic)  Acute Rehab OT Goals Patient Stated Goal: go back to ALF OT Goal Formulation: With patient Time For Goal Achievement: 07/25/23 Potential to Achieve Goals: Poor  Plan      Co-evaluation                 AM-PAC OT "6 Clicks" Daily Activity     Outcome Measure   Help from another person eating meals?: None Help from another person taking care of personal grooming?: Total Help from another person toileting, which includes using toliet, bedpan, or urinal?: Total Help from another person bathing (including washing, rinsing, drying)?: Total Help from another person to put on and taking off regular upper body clothing?: Total Help from another person to put on and taking off regular lower body clothing?: Total 6 Click Score: 9    End of Session    OT Visit Diagnosis: Other abnormalities of gait and mobility (R26.89);History of falling (Z91.81);Muscle weakness (generalized) (M62.81)   Activity Tolerance Patient limited by lethargy   Patient Left in bed;with call bell/phone within reach;with bed alarm set;with  nursing/sitter in room   Nurse Communication          Time: 1115-1130 OT Time Calculation (min): 15 min  Charges: OT General Charges $OT Visit: 1 Visit OT Treatments $Therapeutic Activity: 8-22 mins  Arman Filter., MPH, MS, OTR/L ascom [phone] 07/17/23, 2:09 PM

## 2023-07-18 DIAGNOSIS — R531 Weakness: Secondary | ICD-10-CM

## 2023-07-18 DIAGNOSIS — I951 Orthostatic hypotension: Secondary | ICD-10-CM | POA: Diagnosis not present

## 2023-07-18 DIAGNOSIS — R262 Difficulty in walking, not elsewhere classified: Secondary | ICD-10-CM | POA: Diagnosis not present

## 2023-07-18 DIAGNOSIS — R42 Dizziness and giddiness: Secondary | ICD-10-CM | POA: Diagnosis not present

## 2023-07-18 MED ORDER — MIDODRINE HCL 2.5 MG PO TABS: 10.0000 mg | ORAL_TABLET | Freq: Three times a day (TID) | ORAL | Status: DC

## 2023-07-18 NOTE — NC FL2 (Signed)
[place] MEDICAID FL2 LEVEL OF CARE FORM     IDENTIFICATION  Patient Name: Corey Morrison Birthdate: 01-26-1956 Sex: male Admission Date (Current Location): 07/10/2023  [place] and [place] Number:  Chiropodist and Address:  [facility], [address], [place], [place] [address]      Provider Number: [id number]  Attending Physician Name and Address:  Marcelino Duster, MD  Relative Name and Phone Number:  [name] [facility] [phone]    Current Level of Care: Hospital Recommended Level of Care: Assisted Living Facility Prior Approval Number:    Date Approved/Denied:   PASRR Number: Pending  Discharge Plan: Other (Comment) ([facility])    Current Diagnoses: Patient Active Problem List   Diagnosis Date Noted   Coronary artery disease 07/15/2023   GERD (gastroesophageal reflux disease) 07/15/2023   Sepsis (HCC) 07/15/2023   Secondary hypercoagulability disorder (HCC) 07/15/2023   Altered mental status 02/04/2023   Fall 02/03/2023   Depression with anxiety 02/03/2023   Hypotension 02/03/2023   Leukocytosis 02/03/2023   Acute metabolic encephalopathy 02/03/2023   Prolonged QT interval 02/03/2023   Hallucination 07/27/2022   Peripheral neuropathy 07/27/2022   [name] disease, symptomatic 07/23/2022   Dyslipidemia 07/21/2022   Vitamin B12 deficiency 07/21/2022   Depression 07/21/2022   Nocturnal cough 06/25/2022   Insomnia 06/25/2022   Orthostatic hypotension 06/23/2022   Paroxysmal atrial fibrillation (HCC) 06/23/2022   Normocytic anemia 06/23/2022   Ambulatory dysfunction 06/22/2022   [name]'s disease 06/11/2022    Orientation RESPIRATION BLADDER Height & Weight     Self, Place  Normal Incontinent Weight: 67.2 kg Height:  5\' 9"  (175.3 cm)  BEHAVIORAL SYMPTOMS/MOOD NEUROLOGICAL BOWEL NUTRITION STATUS      Continent Diet  AMBULATORY STATUS COMMUNICATION OF NEEDS Skin   Limited Assist Verbally Normal                        Personal Care Assistance Level of Assistance  Bathing, Feeding, Dressing Bathing Assistance: Limited assistance Feeding assistance: Independent Dressing Assistance: Limited assistance     Functional Limitations Info  Sight, Hearing, Speech Sight Info: Adequate Hearing Info: Adequate Speech Info: Adequate    SPECIAL CARE FACTORS FREQUENCY  PT (By licensed PT), OT (By licensed OT)     PT Frequency: 2 times per week OT Frequency: 2 times per week            Contractures Contractures Info: Not present    Additional Factors Info  Code Status, Allergies, Psychotropic Code Status Info: DNR Allergies Info: NKA Psychotropic Info: Zoloft         Current Medications (07/18/2023):  This is the current hospital active medication list Current Facility-Administered Medications  Medication Dose Route Frequency Provider Last Rate Last Admin   aspirin EC tablet 81 mg  81 mg Oral Daily Charise Killian, MD   81 mg at 07/18/23 1000   carbidopa-levodopa (SINEMET IR) 25-100 MG per tablet immediate release 2 tablet  2 tablet Oral 5 X Daily Effie Shy, RPH   2 tablet at 07/18/23 1000   And   carbidopa-levodopa (SINEMET IR) 25-100 MG per tablet immediate release 3 tablet  3 tablet Oral Q0600 Effie Shy, RPH   3 tablet at 07/18/23 5621   cefTRIAXone (ROCEPHIN) 1 g in sodium chloride 0.9 % 100 mL IVPB  1 g Intravenous Q24H Kadali, Renuka A, MD 200 mL/hr at 07/17/23 1501 1 g at 07/17/23 1501   enoxaparin (LOVENOX) injection  40 mg  40 mg Subcutaneous Q24H Floydene Flock, MD   40 mg at 07/17/23 2135   ezetimibe (ZETIA) tablet 10 mg  10 mg Oral Daily Charise Killian, MD   10 mg at 07/18/23 1008   magnesium hydroxide (MILK OF MAGNESIA) suspension 15 mL  15 mL Oral Daily PRN Charise Killian, MD   15 mL at 07/11/23 1659   melatonin tablet 5 mg  5 mg Oral QHS Lindajo Royal V, MD   5 mg at 07/17/23 2136   midodrine (PROAMATINE) tablet 10 mg  10 mg Oral TID WC  Charise Killian, MD   10 mg at 07/18/23 1216   pantoprazole (PROTONIX) EC tablet 20 mg  20 mg Oral Daily Charise Killian, MD   20 mg at 07/18/23 1000   sertraline (ZOLOFT) tablet 150 mg  150 mg Oral QHS Kadali, Renuka A, MD   150 mg at 07/17/23 2136   sodium chloride flush (NS) 0.9 % injection 3 mL  3 mL Intravenous Q12H Floydene Flock, MD   3 mL at 07/18/23 1215     Discharge Medications: Please see discharge summary for a list of discharge medications.  Relevant Imaging Results:  Relevant Lab Results:   Additional Information SSN [id number]  Marlowe Sax, RN

## 2023-07-18 NOTE — Discharge Summary (Signed)
Physician Discharge Summary   Patient: Corey Morrison MRN: [id number] DOB: 07-26-1956  Admit date:     07/10/2023  Discharge date: 07/18/23  Discharge Physician: Marcelino Duster   PCP: [facility]   Recommendations at discharge:    PCP follow up in 1 week  Discharge Diagnoses: Active Problems:   Ambulatory dysfunction   Orthostatic hypotension   [name]'s disease   Paroxysmal atrial fibrillation (HCC)   Normocytic anemia   Insomnia   Coronary artery disease   GERD (gastroesophageal reflux disease)   Sepsis (HCC) ruled out   Secondary hypercoagulability disorder [facility])  Hospital Course: 67 y.o. male with medical history significant of atrial fibrillation, coronary disease, [name] disease, recurrent falls/orthostasis presenting with syncope. He was orthostatic, started on IV fluids, IV antibiotics with impression of possible UTI. During hospital stay, urine cultures remained negative, his midodrine increased to 10 mg 3 times daily. Patient on and off dizzy while getting out of bed. This is due to his parkinsons disease. Patient is evaluated by PT OT recommended 3 in 1 for ambulation, ALF return. He is hemodynamically stable to be discharged back to ALF. He will need to follow up with PCP upon discharge as instructed. Midodrine dose increased to 10mg  tid. TOC worked on discharge needs.  Assessment and Plan: Orthostatic hypotension Ambulatory dysfunction * Postural dizziness with presyncope- Acute flare. Clinically dry. Got IV fluid hydration. His symptoms better now. Suspect primarily autonomic sequelae of [name]'s disease Blood pressure has ranged from systolic blood pressure 90s to 140s. Patient does report episodes of recurrent dizziness, collateral from nursing staff as he is symptomatically improved Continue midodrine 10 mg 3 times daily.  [name]'s disease [name]'s disease Continue carbidopa levodopa 25 over 100 mg 3 tablets at 6 AM otherwise 5  times daily  Paroxysmal atrial fibrillation (HCC) Currently in sinus we will continue to monitor CAD (coronary artery disease) No active CP  Cont home regimen   Normocytic anemia Anemia, stable Hemoglobin is 9.5, normocytic Outpatient follow-up  Secondary hypercoagulability disorder (HCC) Secondary hypercoagulable state due to the ambulatory dysfunction/immobility as well as paroxysmal atrial fibrillation Has [name]'s induced orthostatic hypotension limiting mobility Continue enoxaparin for DVT prophylaxis, needs outpatient review for why he is not on chronic anticoagulation likely secondary to his falls  Sepsis (HCC) Ruled out. Blood cultures negative. Stopped Rocephin.  Coronary artery disease No chest pain, continue aspirin  Insomnia Continue sertraline 150 mg at night   GERD without esophagitis PPI          Consultants: none Procedures performed: none  Disposition: Group home Diet recommendation:  Discharge Diet Orders (From admission, onward)     Start     Ordered   07/18/23 0000  Diet - low sodium heart healthy        07/18/23 1209           Carb modified diet DISCHARGE MEDICATION: Allergies as of 07/18/2023   No Known Allergies      Medication List     STOP taking these medications    ezetimibe 10 MG tablet Commonly known as: ZETIA   risperiDONE 0.5 MG tablet Commonly known as: RISPERDAL       TAKE these medications    acetaminophen 325 MG tablet Commonly known as: TYLENOL Take 650 mg by mouth every 6 (six) hours. 0800/1400/2000   Antacid Liquid 200-200-20 MG/5ML suspension Generic drug: alum & mag hydroxide-simeth Take 30 mLs by mouth 4 (four) times daily as needed for indigestion or heartburn.   aspirin  EC 81 MG tablet Take 81 mg by mouth daily.   Biofreeze 4 % Gel Generic drug: Menthol (Topical Analgesic) SMARTSIG:sparingly Topical 3 Times Daily   capsicum 0.075 % topical cream Commonly known as: ZOSTRIX Apply 1  Application topically 2 (two) times daily. (Apply to feet and thighs)   carbidopa-levodopa 25-100 MG tablet Commonly known as: SINEMET IR Take 2 tablets by mouth 3 (three) times daily. 0700/1200/1700   carbidopa-levodopa 25-100 MG tablet Commonly known as: SINEMET IR Take 1 tablet by mouth 3 (three) times daily. 1000/1400/2100   cyanocobalamin 1000 MCG tablet Commonly known as: VITAMIN B12 Take 1,000 mcg by mouth daily.   entacapone 200 MG tablet Commonly known as: COMTAN Take 1 tablet (200 mg total) by mouth 6 (six) times daily. What changed: additional instructions   fluticasone 50 MCG/ACT nasal spray Commonly known as: FLONASE Place 1 spray into both nostrils daily.   gabapentin 100 MG capsule Commonly known as: NEURONTIN Take 2 capsules (200 mg total) by mouth 3 (three) times daily. What changed: additional instructions   guaifenesin 100 MG/5ML syrup Commonly known as: ROBITUSSIN Take 15 mLs by mouth every 6 (six) hours as needed for cough.   loperamide 2 MG tablet Commonly known as: IMODIUM A-D Take 4 mg by mouth as needed for diarrhea or loose stools.   magnesium hydroxide 400 MG/5ML suspension Commonly known as: MILK OF MAGNESIA Take 30 mLs by mouth daily as needed for mild constipation.   melatonin 5 MG Tabs Take 1 tablet (5 mg total) by mouth at bedtime.   midodrine 2.5 MG tablet Commonly known as: PROAMATINE Take 4 tablets (10 mg total) by mouth 3 (three) times daily with meals. 0900/1200/1600 What changed: how much to take   omeprazole 20 MG capsule Commonly known as: PRILOSEC Take 20 mg by mouth daily.   polyethylene glycol 17 g packet Commonly known as: MIRALAX / GLYCOLAX Take 17 g by mouth daily as needed for mild constipation.   psyllium 0.52 g capsule Commonly known as: REGULOID Take 0.52 g by mouth at bedtime.   sertraline 100 MG tablet Commonly known as: ZOLOFT Take 1 tablet (100 mg total) by mouth daily. What changed: how much to  take   Vitamin D 50 MCG (2000 UT) Caps Take 2,000 Units by mouth daily.               Durable Medical Equipment  (From admission, onward)           Start     Ordered   07/18/23 1210  DME 3-in-1  Once        07/18/23 1209   07/18/23 1156  For home use only DME Bedside commode  Once       Question:  Patient needs a bedside commode to treat with the following condition  Answer:  Impaired mobility   07/18/23 1156   07/18/23 1108  For home use only DME Bedside commode  Once       Question:  Patient needs a bedside commode to treat with the following condition  Answer:  Impaired mobility   07/18/23 1108            Follow-up Information     [facility]   Why: As needed Contact information: [address] [place] [address] [phone]         Go to  [facility] at [facility].   Specialty: Emergency Medicine Why: If symptoms worsen Contact information: [address]  [place] [address] [phone]               Discharge Exam: Ceasar Mons Weights   07/14/23 0427 07/15/23 0424 07/17/23 0207  Weight: 74.4 kg 74.5 kg 67.2 kg   General - Elderly thin built Caucasian male, no apparent distress HEENT - PERRLA, EOMI, atraumatic head, non tender sinuses. Lung - Clear, rales, rhonchi, wheezes. Heart - S1, S2 heard, no murmurs, rubs, no pedal edema Neuro - Alert, awake, oriented, hand tremors noted. Unable to do coordination test.  Skin - Warm and dry.  mittens noted.  Condition at discharge: stable  The results of significant diagnostics from this hospitalization (including imaging, microbiology, ancillary and laboratory) are listed below for reference.   Imaging Studies: DG Chest Port 1 View  Result Date: 07/14/2023 CLINICAL DATA:  Weakness. EXAM: PORTABLE CHEST 1 VIEW COMPARISON:  X-ray 06/10/2023 FINDINGS: Film is rotated to the right. No consolidation, pneumothorax or effusion. No  edema. Normal cardiopericardial silhouette. Left basilar scar or atelectasis. IMPRESSION: Mild left basilar scar or atelectasis. Electronically Signed   By: Karen Kays M.D.   On: 07/14/2023 15:36   CT Head Wo Contrast  Result Date: 07/10/2023 CLINICAL DATA:  Provided history: Neuro deficit, acute, stroke suspected. Additional history provided: Slurred speech, right arm drift/weakness. History of seizure. EXAM: CT HEAD WITHOUT CONTRAST TECHNIQUE: Contiguous axial images were obtained from the base of the skull through the vertex without intravenous contrast. RADIATION DOSE REDUCTION: This exam was performed according to the departmental dose-optimization program which includes automated exposure control, adjustment of the mA and/or kV according to patient size and/or use of iterative reconstruction technique. COMPARISON:  Head CT 06/10/2023. FINDINGS: Mildly motion degraded exam. Within this limitation, findings are as follows. Brain: No age advanced or lobar predominant parenchymal atrophy. Patchy and ill-defined hypoattenuation within the cerebral white matter, nonspecific but compatible with mild chronic small vessel ischemic disease. There is no acute intracranial hemorrhage. No demarcated cortical infarct. No extra-axial fluid collection. No evidence of an intracranial mass. No midline shift. Vascular: No hyperdense vessel.  Atherosclerotic calcifications. Skull: No calvarial fracture or aggressive osseous lesion. Sinuses/Orbits: No mass or acute finding within the imaged orbits. Small-volume frothy secretions within the right sphenoid sinus. Minimal mucosal thickening within the right maxillary sinus at the imaged levels. IMPRESSION: 1. Mildly motion degraded exam. 2.  No evidence of an acute intracranial abnormality. 3. Mild chronic small vessel ischemic changes within the cerebral white matter. 4. Paranasal sinus disease at the imaged levels, as described. Electronically Signed   By: Jackey Loge D.O.    On: 07/10/2023 10:55    Microbiology: Results for orders placed or performed during the hospital encounter of 07/10/23  SARS Coronavirus 2 by RT PCR (hospital order, performed in [facility] hospital lab) *cepheid single result test* Anterior Nasal Swab     Status: None   Collection Time: 07/10/23 10:44 AM   Specimen: Anterior Nasal Swab  Result Value Ref Range Status   SARS Coronavirus 2 by RT PCR NEGATIVE NEGATIVE Final    Comment: (NOTE) SARS-CoV-2 target nucleic acids are NOT DETECTED.  The SARS-CoV-2 RNA is generally detectable in upper and lower respiratory specimens during the acute phase of infection. The lowest concentration of SARS-CoV-2 viral copies this assay can detect is 250 copies / mL. A negative result does not preclude SARS-CoV-2 infection and should not be used as the sole basis for treatment or other patient management decisions.  A negative result may occur  with improper specimen collection / handling, submission of specimen other than nasopharyngeal swab, presence of viral mutation(s) within the areas targeted by this assay, and inadequate number of viral copies (<250 copies / mL). A negative result must be combined with clinical observations, patient history, and epidemiological information.  Fact Sheet for Patients:   RoadLapTop.co.za  Fact Sheet for Healthcare Providers: http://kim-Fake.com/  This test is not yet approved or  cleared by the [place] FDA and has been authorized for detection and/or diagnosis of SARS-CoV-2 by FDA under an Emergency Use Authorization (EUA).  This EUA will remain in effect (meaning this test can be used) for the duration of the COVID-19 declaration under Section 564(b)(1) of the Act, 21 U.S.C. section 360bbb-3(b)(1), unless the authorization is terminated or revoked sooner.  Performed at [facility], [address] [place], [place] [address]   MRSA Next Gen by  PCR, Nasal     Status: None   Collection Time: 07/10/23  5:49 PM   Specimen: Nasal Mucosa; Nasal Swab  Result Value Ref Range Status   MRSA by PCR Next Gen NOT DETECTED NOT DETECTED Final    Comment: (NOTE) The GeneXpert MRSA Assay (FDA approved for NASAL specimens only), is one component of a comprehensive MRSA colonization surveillance program. It is not intended to diagnose MRSA infection nor to guide or monitor treatment for MRSA infections. Test performance is not FDA approved in patients less than 74 years old. Performed at [facility], [address] [place], [place] [address]   Culture, blood (Routine X 2) w Reflex to ID Panel     Status: None (Preliminary result)   Collection Time: 07/14/23 12:09 PM   Specimen: BLOOD  Result Value Ref Range Status   Specimen Description BLOOD RIGHT ANTECUBITAL  Final   Special Requests   Final    BOTTLES DRAWN AEROBIC AND ANAEROBIC Blood Culture adequate volume   Culture   Final    NO GROWTH 4 DAYS Performed at [facility], [address] [place], [place] [address]    Report Status PENDING  Incomplete  Culture, blood (Routine X 2) w Reflex to ID Panel     Status: None (Preliminary result)   Collection Time: 07/14/23 12:20 PM   Specimen: BLOOD LEFT HAND  Result Value Ref Range Status   Specimen Description BLOOD LEFT HAND  Final   Special Requests   Final    BOTTLES DRAWN AEROBIC AND ANAEROBIC Blood Culture adequate volume   Culture   Final    NO GROWTH 4 DAYS Performed at [facility], [address] [place], [place] [address]    Report Status PENDING  Incomplete    Labs: CBC: Recent Labs  Lab 07/12/23 0527 07/13/23 0322 07/14/23 1209 07/15/23 0522 07/16/23 0525  WBC 8.3 10.7* 9.5 8.8 9.1  HGB 10.2* 10.3* 10.0* 9.5* 9.3*  HCT 30.6* 30.7* 30.4* 28.1* 27.8*  MCV 91.3 89.5 91.6 88.6 89.7  PLT 146* 139* 141* 137* 144*   Basic Metabolic Panel: Recent Labs  Lab 07/12/23 0527  07/13/23 0322 07/14/23 1209 07/15/23 0522  NA 139 139 140 138  K 3.9 3.9 4.1 3.7  CL 109 109 110 110  CO2 23 23 25 22   GLUCOSE 91 96 83 92  BUN 22 18 16 15   CREATININE 0.86 0.83 0.77 0.82  CALCIUM 8.2* 8.4* 8.5* 8.4*   Liver Function Tests: Recent Labs  Lab 07/14/23 1209  AST 11*  ALT <5  ALKPHOS 56  BILITOT 0.4  PROT 6.4*  ALBUMIN 3.1*   CBG: Recent Labs  Lab 07/14/23 0436 07/14/23 1423 07/14/23 2105 07/15/23 1135 07/17/23 0512  GLUCAP 79 122* 105* 131* 75    Discharge time spent: greater than 30 minutes.  Signed: Marcelino Duster, MD [facility] 07/18/2023

## 2023-07-18 NOTE — TOC Progression Note (Addendum)
Transition of Care [facility]) - Progression Note    Patient Details  Name: Corey Morrison MRN: [id number] Date of Birth: [birth year]  Transition of Care [facility] CM/SW Contact  Corey Sax, RN Phone Number: 07/18/2023, 11:21 AM  Clinical Narrative:     [facility] to speak with [facility]y [facility], left a Secure VM asking for a call back to confirm that the patient can return at their level of care  Spoke with Corey Morrison at the [facility], they confirmed that it would be ok for the patient to return to the [facility] today, Their transport person is very Busy and they asked if [facility] can take the patient back today I called and left a message for POA Corey Morrison to call back    Update I spoke with Corey Morrison, he stated that the patient has been to [facility] 3 times and does not fdeel it was a benefit for him he wants him to return to the [facility] where it is a familiar environment, [name] stated that he is out of town and not able to transport, if the [facility] is not able to transport he will need EMS He needs a 3 in1 and it will need to be delivered to the [facility] as EMS will not transport DME Spoke with Corey Morrison the transport person at the [facility], they will pick up the patient at 1 PM, the Nurse and Staff made awarer  Expected Discharge Plan: Skilled Nursing Facility Barriers to Discharge: Continued Medical Work up  Expected Discharge Plan and Services     Post Acute Care Choice: Skilled Nursing Facility Living arrangements for the past 2 months: Assisted Living Facility                                       Social Determinants of Health (SDOH) Interventions SDOH Screenings   Food Insecurity: No Food Insecurity (07/10/2023)  Housing: Patient Declined (07/10/2023)  Transportation Needs: No Transportation Needs (07/10/2023)  Utilities: Not At Risk (07/10/2023)  Social Connections: Unknown (01/24/2023)   Received from [facility]  Tobacco Use: Medium Risk (07/10/2023)   Received from [facility]    Readmission Risk  Interventions    07/23/2022    4:25 PM  Readmission Risk Prevention Plan  Transportation Screening Complete  Medication Review (RN Care Manager) Complete  PCP or Specialist appointment within 3-5 days of discharge Complete  HRI or Home Care Consult Complete  SW Recovery Care/Counseling Consult Complete  Palliative Care Screening Complete  Skilled Nursing Facility Complete

## 2023-07-18 NOTE — Progress Notes (Signed)
[facility] was called, report given to Nurse Ferdie Ping, She acknowledge understanding of patient car. Waiting on transport by EMS to facility.

## 2023-07-18 NOTE — Progress Notes (Cosign Needed)
Patient is not able to walk the distance required to go the bathroom, or he/she is unable to safely negotiate stairs required to access the bathroom.  A 3in1 BSC will alleviate this problem  

## 2023-07-18 NOTE — Plan of Care (Signed)

## 2023-07-18 NOTE — Progress Notes (Signed)
Patient is not able to walk the distance required to go the bathroom, or he/she is unable to safely negotiate stairs required to access the bathroom.  A 3in1 BSC will alleviate this problem  

## 2023-07-19 LAB — CULTURE, BLOOD (ROUTINE X 2)
Culture: NO GROWTH
Culture: NO GROWTH
Special Requests: ADEQUATE
Special Requests: ADEQUATE

## 2023-07-21 ENCOUNTER — Observation Stay: Payer: Medicare Other

## 2023-07-21 ENCOUNTER — Other Ambulatory Visit: Payer: Self-pay

## 2023-07-21 ENCOUNTER — Inpatient Hospital Stay
Admission: EM | Admit: 2023-07-21 | Discharge: 2023-07-23 | DRG: 179 | Disposition: A | Discharge status: Skilled Nursing Facility | Payer: Medicare Other | Source: Skilled Nursing Facility | Attending: Internal Medicine | Admitting: Internal Medicine

## 2023-07-21 ENCOUNTER — Encounter: Payer: Self-pay | Admitting: Internal Medicine

## 2023-07-21 ENCOUNTER — Emergency Department: Payer: Medicare Other

## 2023-07-21 DIAGNOSIS — G9341 Metabolic encephalopathy: Secondary | ICD-10-CM | POA: Diagnosis present

## 2023-07-21 DIAGNOSIS — J690 Pneumonitis due to inhalation of food and vomit: Principal | ICD-10-CM

## 2023-07-21 DIAGNOSIS — Z66 Do not resuscitate: Secondary | ICD-10-CM | POA: Diagnosis present

## 2023-07-21 DIAGNOSIS — R296 Repeated falls: Secondary | ICD-10-CM | POA: Diagnosis present

## 2023-07-21 DIAGNOSIS — Z7982 Long term (current) use of aspirin: Secondary | ICD-10-CM

## 2023-07-21 DIAGNOSIS — R112 Nausea with vomiting, unspecified: Secondary | ICD-10-CM | POA: Diagnosis present

## 2023-07-21 DIAGNOSIS — Z87891 Personal history of nicotine dependence: Secondary | ICD-10-CM

## 2023-07-21 DIAGNOSIS — Z79899 Other long term (current) drug therapy: Secondary | ICD-10-CM

## 2023-07-21 DIAGNOSIS — I2510 Atherosclerotic heart disease of native coronary artery without angina pectoris: Secondary | ICD-10-CM | POA: Diagnosis present

## 2023-07-21 DIAGNOSIS — D649 Anemia, unspecified: Secondary | ICD-10-CM | POA: Diagnosis present

## 2023-07-21 DIAGNOSIS — J449 Chronic obstructive pulmonary disease, unspecified: Secondary | ICD-10-CM | POA: Diagnosis present

## 2023-07-21 DIAGNOSIS — I951 Orthostatic hypotension: Secondary | ICD-10-CM | POA: Diagnosis present

## 2023-07-21 DIAGNOSIS — R001 Bradycardia, unspecified: Secondary | ICD-10-CM | POA: Diagnosis present

## 2023-07-21 DIAGNOSIS — F0280 Dementia in other diseases classified elsewhere without behavioral disturbance: Secondary | ICD-10-CM | POA: Diagnosis present

## 2023-07-21 DIAGNOSIS — Z1152 Encounter for screening for COVID-19: Secondary | ICD-10-CM

## 2023-07-21 DIAGNOSIS — G20A1 Parkinson's disease without dyskinesia, without mention of fluctuations: Secondary | ICD-10-CM | POA: Diagnosis present

## 2023-07-21 DIAGNOSIS — I9589 Other hypotension: Secondary | ICD-10-CM | POA: Diagnosis not present

## 2023-07-21 DIAGNOSIS — K219 Gastro-esophageal reflux disease without esophagitis: Secondary | ICD-10-CM | POA: Diagnosis present

## 2023-07-21 DIAGNOSIS — E785 Hyperlipidemia, unspecified: Secondary | ICD-10-CM | POA: Diagnosis present

## 2023-07-21 DIAGNOSIS — R030 Elevated blood-pressure reading, without diagnosis of hypertension: Secondary | ICD-10-CM | POA: Diagnosis present

## 2023-07-21 DIAGNOSIS — I480 Paroxysmal atrial fibrillation: Secondary | ICD-10-CM | POA: Diagnosis present

## 2023-07-21 LAB — COMPREHENSIVE METABOLIC PANEL
ALT: <5 U/L (ref 0–44)
AST: 13 U/L — ABNORMAL LOW (ref 15–41)
Albumin: 3.7 g/dL (ref 3.5–5.0)
Alkaline Phosphatase: 59 U/L (ref 38–126)
Anion gap: 9 (ref 5–15)
BUN: 26 mg/dL — ABNORMAL HIGH (ref 8–23)
CO2: 26 mmol/L (ref 22–32)
Calcium: 9.1 mg/dL (ref 8.9–10.3)
Chloride: 106 mmol/L (ref 98–111)
Creatinine, Ser: 1.09 mg/dL (ref 0.61–1.24)
GFR, Estimated: >60 mL/min (ref >60)
Glucose, Bld: 115 mg/dL — ABNORMAL HIGH (ref 70–99)
Potassium: 3.6 mmol/L (ref 3.5–5.1)
Sodium: 141 mmol/L (ref 135–145)
Total Bilirubin: 0.5 mg/dL (ref 0.3–1.2)
Total Protein: 7.1 g/dL (ref 6.5–8.1)

## 2023-07-21 LAB — SARS CORONAVIRUS 2 BY RT PCR: SARS Coronavirus 2 by RT PCR: NEGATIVE

## 2023-07-21 LAB — CBC WITH DIFFERENTIAL/PLATELET
Abs Immature Granulocytes: 0.04 10*3/uL (ref 0.00–0.07)
Basophils Absolute: 0 10*3/uL (ref 0.0–0.1)
Basophils Relative: 0 %
Eosinophils Absolute: 0.1 10*3/uL (ref 0.0–0.5)
Eosinophils Relative: 1 %
HCT: 32.9 % — ABNORMAL LOW (ref 39.0–52.0)
Hemoglobin: 10.8 g/dL — ABNORMAL LOW (ref 13.0–17.0)
Immature Granulocytes: 1 %
Lymphocytes Relative: 7 %
Lymphs Abs: 0.5 10*3/uL — ABNORMAL LOW (ref 0.7–4.0)
MCH: 29.8 pg (ref 26.0–34.0)
MCHC: 32.8 g/dL (ref 30.0–36.0)
MCV: 90.6 fL (ref 80.0–100.0)
Monocytes Absolute: 0.3 10*3/uL (ref 0.1–1.0)
Monocytes Relative: 4 %
Neutro Abs: 6.2 10*3/uL (ref 1.7–7.7)
Neutrophils Relative %: 87 %
Platelets: 175 10*3/uL (ref 150–400)
RBC: 3.63 MIL/uL — ABNORMAL LOW (ref 4.22–5.81)
RDW: 13.2 % (ref 11.5–15.5)
WBC: 7.1 10*3/uL (ref 4.0–10.5)
nRBC: 0 % (ref 0.0–0.2)

## 2023-07-21 LAB — MAGNESIUM: Magnesium: 2.6 mg/dL — ABNORMAL HIGH (ref 1.7–2.4)

## 2023-07-21 LAB — LIPASE, BLOOD: Lipase: 41 U/L (ref 11–51)

## 2023-07-21 MED ORDER — ENOXAPARIN SODIUM 40 MG/0.4ML IJ SOSY
40.0000 mg | PREFILLED_SYRINGE | INTRAMUSCULAR | Status: DC
  Administered 2023-07-21 – 2023-07-22 (×2): 40 mg via SUBCUTANEOUS
  Filled 2023-07-21 (×2): qty 0.4

## 2023-07-21 MED ORDER — BISACODYL 10 MG RE SUPP
10.0000 mg | Freq: Once | RECTAL | Status: DC
  Filled 2023-07-21: qty 1

## 2023-07-21 MED ORDER — GABAPENTIN 100 MG PO CAPS
200.0000 mg | ORAL_CAPSULE | Freq: Three times a day (TID) | ORAL | Status: DC
  Administered 2023-07-21 – 2023-07-23 (×5): 200 mg via ORAL
  Filled 2023-07-21 (×6): qty 2

## 2023-07-21 MED ORDER — PANTOPRAZOLE SODIUM 40 MG PO TBEC
40.0000 mg | DELAYED_RELEASE_TABLET | Freq: Every day | ORAL | Status: DC
  Administered 2023-07-22 – 2023-07-23 (×2): 40 mg via ORAL
  Filled 2023-07-21 (×2): qty 1

## 2023-07-21 MED ORDER — FLUTICASONE PROPIONATE 50 MCG/ACT NA SUSP: 1.0000 | Freq: Every day | NASAL | Status: DC | PRN

## 2023-07-21 MED ORDER — POLYETHYLENE GLYCOL 3350 17 G PO PACK: 17.0000 g | PACK | Freq: Every day | ORAL | Status: DC | PRN

## 2023-07-21 MED ORDER — MIDODRINE HCL 5 MG PO TABS
10.0000 mg | ORAL_TABLET | Freq: Three times a day (TID) | ORAL | Status: DC
  Administered 2023-07-22 (×3): 10 mg via ORAL
  Filled 2023-07-21 (×3): qty 2

## 2023-07-21 MED ORDER — HYDRALAZINE HCL 10 MG PO TABS
10.0000 mg | ORAL_TABLET | Freq: Four times a day (QID) | ORAL | Status: DC | PRN
  Administered 2023-07-22: 10 mg via ORAL
  Filled 2023-07-21: qty 1

## 2023-07-21 MED ORDER — ACETAMINOPHEN 325 MG PO TABS
650.0000 mg | ORAL_TABLET | Freq: Three times a day (TID) | ORAL | Status: DC
  Administered 2023-07-21 – 2023-07-23 (×7): 650 mg via ORAL
  Filled 2023-07-21 (×7): qty 2

## 2023-07-21 MED ORDER — MIDODRINE HCL 5 MG PO TABS
10.0000 mg | ORAL_TABLET | Freq: Once | ORAL | Status: AC
  Administered 2023-07-21: 10 mg via ORAL
  Filled 2023-07-21: qty 2

## 2023-07-21 MED ORDER — ASPIRIN 81 MG PO TBEC
81.0000 mg | DELAYED_RELEASE_TABLET | Freq: Every day | ORAL | Status: DC
  Administered 2023-07-22 – 2023-07-23 (×2): 81 mg via ORAL
  Filled 2023-07-21 (×2): qty 1

## 2023-07-21 MED ORDER — ALUM & MAG HYDROXIDE-SIMETH 200-200-20 MG/5ML PO SUSP: 30.0000 mL | Freq: Four times a day (QID) | ORAL | Status: DC | PRN

## 2023-07-21 MED ORDER — MAGNESIUM HYDROXIDE 400 MG/5ML PO SUSP: 30.0000 mL | Freq: Every day | ORAL | Status: DC | PRN

## 2023-07-21 MED ORDER — SERTRALINE HCL 50 MG PO TABS
150.0000 mg | ORAL_TABLET | Freq: Every day | ORAL | Status: DC
  Administered 2023-07-22 – 2023-07-23 (×2): 150 mg via ORAL
  Filled 2023-07-21 (×2): qty 3

## 2023-07-21 MED ORDER — SODIUM CHLORIDE 0.9 % IV SOLN
3.0000 g | Freq: Once | INTRAVENOUS | Status: AC
  Administered 2023-07-21: 3 g via INTRAVENOUS
  Filled 2023-07-21: qty 8

## 2023-07-21 MED ORDER — GUAIFENESIN 100 MG/5ML PO LIQD: 15.0000 mL | Freq: Four times a day (QID) | ORAL | Status: DC | PRN

## 2023-07-21 MED ORDER — CARBIDOPA-LEVODOPA 25-100 MG PO TABS
2.0000 | ORAL_TABLET | Freq: Every day | ORAL | Status: DC
  Administered 2023-07-21 – 2023-07-23 (×9): 2 via ORAL
  Filled 2023-07-21 (×12): qty 2

## 2023-07-21 MED ORDER — PROMETHAZINE HCL 25 MG PO TABS: 12.5000 mg | ORAL_TABLET | Freq: Four times a day (QID) | ORAL | Status: DC | PRN

## 2023-07-21 MED ORDER — ENTACAPONE 200 MG PO TABS
200.0000 mg | ORAL_TABLET | Freq: Every day | ORAL | Status: DC
  Administered 2023-07-21 – 2023-07-23 (×10): 200 mg via ORAL
  Filled 2023-07-21 (×17): qty 1

## 2023-07-21 MED ORDER — IPRATROPIUM-ALBUTEROL 0.5-2.5 (3) MG/3ML IN SOLN: 3.0000 mL | Freq: Four times a day (QID) | RESPIRATORY_TRACT | Status: DC | PRN

## 2023-07-21 MED ORDER — GUAIFENESIN ER 600 MG PO TB12
600.0000 mg | ORAL_TABLET | Freq: Two times a day (BID) | ORAL | Status: DC
  Administered 2023-07-21 – 2023-07-23 (×5): 600 mg via ORAL
  Filled 2023-07-21 (×5): qty 1

## 2023-07-21 MED ORDER — METOCLOPRAMIDE HCL 5 MG/ML IJ SOLN: 5.0000 mg | Freq: Three times a day (TID) | INTRAMUSCULAR | Status: DC | PRN

## 2023-07-21 MED ORDER — SODIUM CHLORIDE 0.9 % IV SOLN
3.0000 g | Freq: Four times a day (QID) | INTRAVENOUS | Status: DC
  Administered 2023-07-21 – 2023-07-23 (×8): 3 g via INTRAVENOUS
  Filled 2023-07-21 (×10): qty 8

## 2023-07-21 MED ORDER — LACTATED RINGERS IV BOLUS
1000.0000 mL | Freq: Once | INTRAVENOUS | Status: AC
  Administered 2023-07-21: 1000 mL via INTRAVENOUS

## 2023-07-21 MED ORDER — LOPERAMIDE HCL 2 MG PO CAPS: 4.0000 mg | ORAL_CAPSULE | Freq: Four times a day (QID) | ORAL | Status: DC | PRN

## 2023-07-21 MED ORDER — MELATONIN 5 MG PO TABS
5.0000 mg | ORAL_TABLET | Freq: Every day | ORAL | Status: DC
  Administered 2023-07-21 – 2023-07-22 (×2): 5 mg via ORAL
  Filled 2023-07-21 (×2): qty 1

## 2023-07-21 NOTE — ED Provider Notes (Signed)
[facility] Provider Note    Event Date/Time   First MD Initiated Contact with Patient 07/21/23 [phone]     (approximate)   History   Chief Complaint Emesis and Weakness   HPI  Corey Morrison is a 67 y.o. male with past medical history of atrial fibrillation, anemia, CAD, [name]'s disease, and orthostatic hypotension who presents to the ED complaining of nausea and vomiting.  Per EMS, patient has had multiple episodes of vomiting this morning and has seemed weaker than usual to staff at his nursing facility.  Patient given IM Zofran with EMS, now reports that his nausea is improved and he denies any abdominal pain.  EMS reports that patient was hypotensive with blood pressures as low as 70s over 40s, although patient reportedly with low blood pressure at baseline per staff at nursing facility.  He takes midodrine chronically and has not yet had his morning dose.  Patient does endorse some cough and mild difficulty breathing, has not had any fevers or pain in his chest.  He also denies any dysuria, hematuria, or flank pain.     Physical Exam   Triage Vital Signs: ED Triage Vitals  Encounter Vitals Group     BP      Systolic BP Percentile      Diastolic BP Percentile      Pulse      Resp      Temp      Temp src      SpO2      Weight      Height      Head Circumference      Peak Flow      Pain Score      Pain Loc      Pain Education      Exclude from Growth Chart     Most recent vital signs: Vitals:   07/21/23 1100 07/21/23 1200  BP: (!) 86/59 116/88  Pulse: (!) 51 (!) 51  Resp: 20 14  Temp:    SpO2: 99% 100%    Constitutional: Alert and oriented. Eyes: Conjunctivae are normal. Head: Atraumatic. Nose: No congestion/rhinnorhea. Mouth/Throat: Mucous membranes are moist.  Cardiovascular: Normal rate, regular rhythm. Grossly normal heart sounds.  2+ radial pulses bilaterally. Respiratory: Normal respiratory effort.  No retractions. Lungs  CTAB. Gastrointestinal: Soft and nontender. No distention. Musculoskeletal: No lower extremity tenderness nor edema.  Neurologic:  Normal speech and language. No gross focal neurologic deficits are appreciated.    ED Results / Procedures / Treatments   Labs (all labs ordered are listed, but only abnormal results are displayed) Labs Reviewed  CBC WITH DIFFERENTIAL/PLATELET - Abnormal; Notable for the following components:      Result Value   RBC 3.63 (*)    Hemoglobin 10.8 (*)    HCT 32.9 (*)    Lymphs Abs 0.5 (*)    All other components within normal limits  COMPREHENSIVE METABOLIC PANEL - Abnormal; Notable for the following components:   Glucose, Bld 115 (*)    BUN 26 (*)    AST 13 (*)    All other components within normal limits  MAGNESIUM - Abnormal; Notable for the following components:   Magnesium 2.6 (*)    All other components within normal limits  SARS CORONAVIRUS 2 BY RT PCR  LIPASE, BLOOD  URINALYSIS, ROUTINE W REFLEX MICROSCOPIC     EKG  ED ECG REPORT I, Chesley Noon, the attending physician, personally viewed and interpreted this ECG.  Date: 07/21/2023  EKG Time: 10:11  Rate: 75  Rhythm: normal sinus rhythm  Axis: LAD  Intervals:left bundle branch block  ST&T Change: None  RADIOLOGY Chest x-ray with bibasilar opacities, no edema or effusion noted.  PROCEDURES:  Critical Care performed: No  Procedures   MEDICATIONS ORDERED IN ED: Medications  Ampicillin-Sulbactam (UNASYN) 3 g in sodium chloride 0.9 % 100 mL IVPB (3 g Intravenous New Bag/Given 07/21/23 1240)  lactated ringers bolus 1,000 mL (0 mLs Intravenous Stopped 07/21/23 1154)  midodrine (PROAMATINE) tablet 10 mg (10 mg Oral Given 07/21/23 1021)     IMPRESSION / MDM / ASSESSMENT AND PLAN / ED COURSE  I reviewed the triage vital signs and the nursing notes.                              67 y.o. male with past medical history of atrial fibrillation, CAD, anemia, [name]'s disease, and  orthostatic hypotension on midodrine who presents to the ED with nausea and vomiting with weakness and low blood pressure starting this morning.  Patient's presentation is most consistent with acute presentation with potential threat to life or bodily function.  Differential diagnosis includes, but is not limited to, sepsis, UTI, gastroenteritis, dehydration, electrolyte abnormality, AKI, anemia, pneumonia, aspiration, viral syndrome, appendicitis, kidney stone.  Patient chronically ill-appearing but in no acute distress, vital signs remarkable for borderline hypotension at 92/56 but are otherwise reassuring.  Patient deals with chronic hypotension and takes midodrine, has not yet had his morning dose and it also appears that facility has only been giving 7.5 mg while patient's dose was increased to 10 mg during recent admission.  We will give his dose of 10 mg of midodrine, patient reports nausea improved following Zofran given by EMS.  His abdomen is soft with no focal tenderness, will further assess with labs but hold off on CT imaging at this time.  We will also check chest x-ray and COVID test given his cough and shortness of breath.  Plan to hydrate with IV fluids and reassess.  BP improved following IV fluids and midodrine.  Labs are reassuring with no significant anemia, leukocytosis, showed abnormality, or AKI.  LFTs and lipase are also unremarkable, patient with no further episodes of vomiting here in the ED.  However, he does endorse some cough and mild difficulty breathing, x-ray concerning for bibasilar opacities likely due to aspiration.  Patient started on IV Unasyn and case discussed with hospitalist for admission.      FINAL CLINICAL IMPRESSION(S) / ED DIAGNOSES   Final diagnoses:  Aspiration pneumonia of both lower lobes due to vomit (HCC)  Nausea and vomiting, unspecified vomiting type  Chronic hypotension     Rx / DC Orders   ED Discharge Orders     None         Note:  This document was prepared using Dragon voice recognition software and may include unintentional dictation errors.   Chesley Noon, MD 07/21/23 1250

## 2023-07-21 NOTE — ED Triage Notes (Signed)
Pt to ED via [facility] from the [facility] for c/o emesis and increased weakness. Staff reports pt fell two days ago. Pt has hx of [name]'s, hypotension. Given 4 mg zofran IM by EMS.  BP 92/42 HR 66 97% RA Cbg 141

## 2023-07-21 NOTE — Consult Note (Signed)
Pharmacy Antibiotic Note  Corey Morrison is a 67 y.o. male admitted on 07/21/2023 with aspiration pneumonia.  Pharmacy has been consulted for Unasyn dosing.  Unasyn 3 grams IV x 1 8/23 @ 1240  Plan: Start Unasyn 3 grams IV every 6 hours Follow renal function for adjustments    Temp (24hrs), Avg:97.6 F (36.4 C), Min:97.6 F (36.4 C), Max:97.6 F (36.4 C)  Recent Labs  Lab 07/15/23 0522 07/16/23 0525 07/21/23 1014  WBC 8.8 9.1 7.1  CREATININE 0.82  --  1.09    Estimated Creatinine Clearance: 62.5 mL/min (by C-G formula based on SCr of 1.09 mg/dL).    No Known Allergies  Antimicrobials this admission: Unasyn 8/23 >>  Dose adjustments this admission: N/A  Microbiology results: N/A  Thank you for allowing pharmacy to be a part of this patient's care.  Barrie Folk, PharmD 07/21/2023 1:38 PM

## 2023-07-21 NOTE — H&P (Signed)
History and Physical    Corey Morrison UYQ:[id number] DOB: 1956/04/15 DOA: 07/21/2023  PCP: [facility] (Confirm with patient/family/NH records and if not entered, this has to be entered at [facility] point of entry) Patient coming from: SNF  I have personally briefly reviewed patient's old medical records in [facility]  Chief Complaint: Feeling better  HPI: Corey Morrison is a 67 y.o. male with medical history significant of [name] disease, orthostatic hypotension, PAF, chronic ambulation dysfunction with recurrent falls, sent from nursing home for evaluation of worsening of generalized weakness, frequent falls and nauseous vomiting.  At baseline patient has very unsteady gait on top of orthostatic hypotension and tendency to fall.  Last episode of fall was 2 days ago at nursing home.  Since then patient developed worsening of generalized weakness and this morning, patient felt nauseous and vomited 2 times stomach content nonbloody none bile.  Patient denied any abdominal pain and reported that feeling of nauseous has subsided and denied any abdominal pain, no diarrhea no fever or chills. ED Course: Borderline bradycardia blood pressure 90/50, O2 saturation 95% on room air.  Workup showed x-ray suspicious for right lower lobe infiltrate suspicious for pneumonia probably related to aspiration.  Blood work showed WBC 7.1, hemoglobin 10.8 platelet 175, creatinine 1.0.  Patient was given IV fluid 1000 x 1 and Unasyn.  Review of Systems: As per HPI otherwise 14 point review of systems negative.    Past Medical History:  Diagnosis Date   A-fib (HCC)    Anxiety disorder    Coronary artery disease    Hyperlipidemia    [name] disease    Vitamin B12 deficiency     Past Surgical History:  Procedure Laterality Date   CARDIAC CATHETERIZATION       reports that he has quit smoking. He has never used smokeless tobacco. He reports that he does not drink alcohol and does not use  drugs.  No Known Allergies  Family History  Problem Relation Age of Onset   Heart Problems Father     Prior to Admission medications   Medication Sig Start Date End Date Taking? Authorizing Provider  acetaminophen (TYLENOL) 325 MG tablet Take 650 mg by mouth every 6 (six) hours. 0800/1400/2000    [provider]  alum & mag hydroxide-simeth (ANTACID LIQUID) 200-200-20 MG/5ML suspension Take 30 mLs by mouth 4 (four) times daily as needed for indigestion or heartburn.    [provider]  aspirin 81 MG EC tablet Take 81 mg by mouth daily.    [provider]  BIOFREEZE 4 % GEL SMARTSIG:sparingly Topical 3 Times Daily 07/14/22   [provider]  capsicum (ZOSTRIX) 0.075 % topical cream Apply 1 Application topically 2 (two) times daily. (Apply to feet and thighs)    [provider]  carbidopa-levodopa (SINEMET IR) 25-100 MG tablet Take 2 tablets by mouth 3 (three) times daily. 0700/1200/1700 Patient not taking: Reported on 07/11/2023 12/13/22   [provider]  carbidopa-levodopa (SINEMET IR) 25-100 MG tablet Take 1 tablet by mouth 3 (three) times daily. 1000/1400/2100 02/07/23 03/09/23  Arnetha Courser, MD  Cholecalciferol (VITAMIN D) 50 MCG (2000 UT) CAPS Take 2,000 Units by mouth daily. Patient not taking: Reported on 07/11/2023    [provider]  entacapone (COMTAN) 200 MG tablet Take 1 tablet (200 mg total) by mouth 6 (six) times daily. Patient taking differently: Take 200 mg by mouth 6 (six) times daily. 0200/0600/1000/1400/1800/2200 07/28/22 02/03/23  Delfino Lovett, MD  fluticasone Aleda Grana)  50 MCG/ACT nasal spray Place 1 spray into both nostrils daily.    [provider]  gabapentin (NEURONTIN) 100 MG capsule Take 2 capsules (200 mg total) by mouth 3 (three) times daily. Patient taking differently: Take 200 mg by mouth 3 (three) times daily. 0800/1400/2000 07/28/22 07/11/23  Delfino Lovett, MD  guaifenesin (ROBITUSSIN) 100 MG/5ML  syrup Take 15 mLs by mouth every 6 (six) hours as needed for cough.    [provider]  loperamide (IMODIUM A-D) 2 MG tablet Take 4 mg by mouth as needed for diarrhea or loose stools.    [provider]  magnesium hydroxide (MILK OF MAGNESIA) 400 MG/5ML suspension Take 30 mLs by mouth daily as needed for mild constipation.    [provider]  melatonin 5 MG TABS Take 1 tablet (5 mg total) by mouth at bedtime. 06/28/22   Pennie Banter, DO  midodrine (PROAMATINE) 2.5 MG tablet Take 4 tablets (10 mg total) by mouth 3 (three) times daily with meals. 0900/1200/1600 07/18/23   Marcelino Duster, MD  omeprazole (PRILOSEC) 20 MG capsule Take 20 mg by mouth daily. 10/10/22   [provider]  polyethylene glycol (MIRALAX / GLYCOLAX) 17 g packet Take 17 g by mouth daily as needed for mild constipation. 06/28/22   Esaw Grandchild A, DO  psyllium (REGULOID) 0.52 g capsule Take 0.52 g by mouth at bedtime. Patient not taking: Reported on 07/11/2023    [provider]  sertraline (ZOLOFT) 100 MG tablet Take 1 tablet (100 mg total) by mouth daily. Patient taking differently: Take 150 mg by mouth daily. 07/28/22 02/03/23  Delfino Lovett, MD  vitamin B-12 (CYANOCOBALAMIN) 1000 MCG tablet Take 1,000 mcg by mouth daily. Patient not taking: Reported on 07/11/2023    [provider]    Physical Exam: Vitals:   07/21/23 1001 07/21/23 1100 07/21/23 1200  BP: (!) 92/56 (!) 86/59 116/88  Pulse: 62 (!) 51 (!) 51  Resp: 16 20 14   Temp: 97.6 F (36.4 C)    TempSrc: Oral    SpO2: 98% 99% 100%    Constitutional: NAD, calm, comfortable Vitals:   07/21/23 1001 07/21/23 1100 07/21/23 1200  BP: (!) 92/56 (!) 86/59 116/88  Pulse: 62 (!) 51 (!) 51  Resp: 16 20 14   Temp: 97.6 F (36.4 C)    TempSrc: Oral    SpO2: 98% 99% 100%   Eyes: PERRL, lids and conjunctivae normal ENMT: Mucous membranes are moist. Posterior pharynx clear of any exudate or lesions.Normal  dentition.  Neck: normal, supple, no masses, no thyromegaly Respiratory: clear to auscultation bilaterally, no wheezing, no crackles. Normal respiratory effort. No accessory muscle use.  Cardiovascular: Regular rate and rhythm, no murmurs / rubs / gallops. No extremity edema. 2+ pedal pulses. No carotid bruits.  Abdomen: no tenderness, no masses palpated. No hepatosplenomegaly. Bowel sounds positive.  Musculoskeletal: no clubbing / cyanosis. No joint deformity upper and lower extremities. Good ROM, no contractures. Normal muscle tone.  Skin: no rashes, lesions, ulcers. No induration Neurologic: CN 2-12 grossly intact. Sensation intact, DTR normal. Strength 5/5 in all 4.  Psychiatric: Normal judgment and insight. Alert and oriented x 3. Normal mood.    Labs on Admission: I have personally reviewed following labs and imaging studies  CBC: Recent Labs  Lab 07/15/23 0522 07/16/23 0525 07/21/23 1014  WBC 8.8 9.1 7.1  NEUTROABS  --   --  6.2  HGB 9.5* 9.3* 10.8*  HCT 28.1* 27.8* 32.9*  MCV 88.6 89.7 90.6  PLT 137* 144* 175   Basic Metabolic Panel: Recent Labs  Lab 07/15/23 0522 07/21/23 1014  NA 138 141  K 3.7 3.6  CL 110 106  CO2 22 26  GLUCOSE 92 115*  BUN 15 26*  CREATININE 0.82 1.09  CALCIUM 8.4* 9.1  MG  --  2.6*   GFR: Estimated Creatinine Clearance: 62.5 mL/min (by C-G formula based on SCr of 1.09 mg/dL). Liver Function Tests: Recent Labs  Lab 07/21/23 1014  AST 13*  ALT <5  ALKPHOS 59  BILITOT 0.5  PROT 7.1  ALBUMIN 3.7   Recent Labs  Lab 07/21/23 1014  LIPASE 41   No results for input(s): "AMMONIA" in the last 168 hours. Coagulation Profile: No results for input(s): "INR", "PROTIME" in the last 168 hours. Cardiac Enzymes: No results for input(s): "CKTOTAL", "CKMB", "CKMBINDEX", "TROPONINI" in the last 168 hours. BNP (last 3 results) No results for input(s): "PROBNP" in the last 8760 hours. HbA1C: No results for input(s): "HGBA1C" in the last 72  hours. CBG: Recent Labs  Lab 07/14/23 1423 07/14/23 2105 07/15/23 1135 07/17/23 0512  GLUCAP 122* 105* 131* 75   Lipid Profile: No results for input(s): "CHOL", "HDL", "LDLCALC", "TRIG", "CHOLHDL", "LDLDIRECT" in the last 72 hours. Thyroid Function Tests: No results for input(s): "TSH", "T4TOTAL", "FREET4", "T3FREE", "THYROIDAB" in the last 72 hours. Anemia Panel: No results for input(s): "VITAMINB12", "FOLATE", "FERRITIN", "TIBC", "IRON", "RETICCTPCT" in the last 72 hours. Urine analysis:    Component Value Date/Time   COLORURINE YELLOW (A) 07/14/2023 1304   APPEARANCEUR HAZY (A) 07/14/2023 1304   LABSPEC 1.016 07/14/2023 1304   PHURINE 5.0 07/14/2023 1304   GLUCOSEU 50 (A) 07/14/2023 1304   HGBUR SMALL (A) 07/14/2023 1304   BILIRUBINUR NEGATIVE 07/14/2023 1304   KETONESUR 5 (A) 07/14/2023 1304   PROTEINUR NEGATIVE 07/14/2023 1304   NITRITE NEGATIVE 07/14/2023 1304   LEUKOCYTESUR NEGATIVE 07/14/2023 1304    Radiological Exams on Admission: DG Chest Portable 1 View  Result Date: 07/21/2023 CLINICAL DATA:  Shortness of breath, emesis, and increased weakness EXAM: PORTABLE CHEST 1 VIEW COMPARISON:  Chest radiograph dated 07/14/2023 FINDINGS: Lung volumes. Patchy bibasilar opacities. No pleural effusion or pneumothorax. Similar mildly enlarged cardiomediastinal silhouette. No acute osseous abnormality. IMPRESSION: Low lung volumes with patchy bibasilar opacities, which may represent atelectasis, aspiration, or pneumonia. Electronically Signed   By: Agustin Cree M.D.   On: 07/21/2023 10:14    EKG: Independently reviewed.  Sinus, chronic LBBB  Assessment/Plan Principal Problem:   Aspiration pneumonia (HCC) Active Problems:   Orthostatic hypotension   Acute metabolic encephalopathy   [name]'s disease  (please populate well all problems here in Problem List. (For example, if patient is on BP meds at home and you resume or decide to hold them, it is a problem that needs to be  her. Same for CAD, COPD, HLD and so on)  Aspiration pneumonia -Evidenced by the new x-ray finding of right lower lobe infiltrates and history of nauseous vomiting this morning. -Plan to continue Unasyn, if no significant hypoxia overnight likely can transfer to p.o. antibiotics and discharged back to nursing home -Other supportive care including guaifenesin, breathing treatment as needed  Acute on chronic orthostatic hypotension -Clinically patient appeared to be euvolemic, doubt sepsis -Will not continue maintenance IV fluid -Will restart midodrine  Nauseous vomiting -Appears to be self-limiting, suspect food toxin -Abdomen examination benign, will order x-ray -Continue to monitor GI symptoms -As needed Reglan for nauseous vomiting  [name]'s disease Chronic ambulation dysfunction -  Continue Sinemet every 6 hours -PT evaluation  PAF -In sinus rhythm, not on anticoagulation probably secondary to frequent falls  Chronic normocytic anemia -Stable, outpatient follow-up with PCP  GERD -Stable, continue PPI  DVT prophylaxis: Lovenox Code Status: DNR Family Communication: None at bedside Disposition Plan: Expect less than 2 midnight hospital stay Consults called: None Admission status: Tele obs   Emeline General MD [facility] Pager [phone]  07/21/2023, 1:14 PM

## 2023-07-22 DIAGNOSIS — J449 Chronic obstructive pulmonary disease, unspecified: Secondary | ICD-10-CM | POA: Diagnosis present

## 2023-07-22 DIAGNOSIS — D649 Anemia, unspecified: Secondary | ICD-10-CM | POA: Diagnosis present

## 2023-07-22 DIAGNOSIS — R001 Bradycardia, unspecified: Secondary | ICD-10-CM | POA: Diagnosis present

## 2023-07-22 DIAGNOSIS — I951 Orthostatic hypotension: Secondary | ICD-10-CM | POA: Diagnosis present

## 2023-07-22 DIAGNOSIS — F0280 Dementia in other diseases classified elsewhere without behavioral disturbance: Secondary | ICD-10-CM | POA: Diagnosis present

## 2023-07-22 DIAGNOSIS — Z79899 Other long term (current) drug therapy: Secondary | ICD-10-CM | POA: Diagnosis not present

## 2023-07-22 DIAGNOSIS — E785 Hyperlipidemia, unspecified: Secondary | ICD-10-CM | POA: Diagnosis present

## 2023-07-22 DIAGNOSIS — I9589 Other hypotension: Secondary | ICD-10-CM | POA: Diagnosis present

## 2023-07-22 DIAGNOSIS — R112 Nausea with vomiting, unspecified: Secondary | ICD-10-CM | POA: Diagnosis present

## 2023-07-22 DIAGNOSIS — Z1152 Encounter for screening for COVID-19: Secondary | ICD-10-CM | POA: Diagnosis not present

## 2023-07-22 DIAGNOSIS — K219 Gastro-esophageal reflux disease without esophagitis: Secondary | ICD-10-CM | POA: Diagnosis present

## 2023-07-22 DIAGNOSIS — I480 Paroxysmal atrial fibrillation: Secondary | ICD-10-CM | POA: Diagnosis present

## 2023-07-22 DIAGNOSIS — G20A1 Parkinson's disease without dyskinesia, without mention of fluctuations: Secondary | ICD-10-CM | POA: Diagnosis present

## 2023-07-22 DIAGNOSIS — J690 Pneumonitis due to inhalation of food and vomit: Secondary | ICD-10-CM | POA: Diagnosis present

## 2023-07-22 DIAGNOSIS — Z7982 Long term (current) use of aspirin: Secondary | ICD-10-CM | POA: Diagnosis not present

## 2023-07-22 DIAGNOSIS — Z87891 Personal history of nicotine dependence: Secondary | ICD-10-CM | POA: Diagnosis not present

## 2023-07-22 DIAGNOSIS — I2510 Atherosclerotic heart disease of native coronary artery without angina pectoris: Secondary | ICD-10-CM | POA: Diagnosis present

## 2023-07-22 DIAGNOSIS — R296 Repeated falls: Secondary | ICD-10-CM | POA: Diagnosis present

## 2023-07-22 DIAGNOSIS — R030 Elevated blood-pressure reading, without diagnosis of hypertension: Secondary | ICD-10-CM | POA: Diagnosis present

## 2023-07-22 DIAGNOSIS — Z66 Do not resuscitate: Secondary | ICD-10-CM | POA: Diagnosis present

## 2023-07-22 LAB — CBC
HCT: 30 % — ABNORMAL LOW (ref 39.0–52.0)
Hemoglobin: 9.6 g/dL — ABNORMAL LOW (ref 13.0–17.0)
MCH: 29.4 pg (ref 26.0–34.0)
MCHC: 32 g/dL (ref 30.0–36.0)
MCV: 92 fL (ref 80.0–100.0)
Platelets: 165 10*3/uL (ref 150–400)
RBC: 3.26 MIL/uL — ABNORMAL LOW (ref 4.22–5.81)
RDW: 13.3 % (ref 11.5–15.5)
WBC: 7.7 10*3/uL (ref 4.0–10.5)
nRBC: 0 % (ref 0.0–0.2)

## 2023-07-22 NOTE — Evaluation (Signed)
Physical Therapy Evaluation Patient Details Name: Corey Morrison MRN: [id number] DOB: 06-20-1956 Today's Date: 07/22/2023  History of Present Illness  Corey Morrison is a 67 y.o. male admitted on 07/21/2023 with aspiration pneumonia. He was discharged from the hospital on 07/18/2023. He has a PMHx significant for [name]'s disease, paroxysmal atrial fibrillation, CAD, and recurrent falls/orthostasis presenting with syncope.  Clinical Impression  The pt is presenting this session with some balance and gait impairments that increase his risk of falling. The pt also demonstrates some impulsive behavior during session that is corrected with education. At this time the pt is demonstrating some improved mobility since his last admission, just days ago. The pt's clarity of speech seems impaired, MD notified that speech consult might be beneficial, especially for aspiration pneumonia dx. Pt will continue to follow in order to address impairments.         If plan is discharge home, recommend the following: A little help with walking and/or transfers;A little help with bathing/dressing/bathroom;Assistance with cooking/housework;Direct supervision/assist for medications management   Can travel by private vehicle   No    Equipment Recommendations None recommended by PT  Recommendations for Other Services  Speech consult    Functional Status Assessment Patient has had a recent decline in their functional status and demonstrates the ability to make significant improvements in function in a reasonable and predictable amount of time.     Precautions / Restrictions Precautions Precautions: Fall Precaution Comments: hx of multiple syncopal episodes      Mobility  Bed Mobility Overal bed mobility: Needs Assistance Bed Mobility: Supine to Sit     Supine to sit: Min assist (Verbal cues for sequencing of task)          Transfers Overall transfer level: Needs assistance   Transfers: Sit to/from  Stand Sit to Stand: Contact guard assist, Min assist           General transfer comment: Pt presenting with some posterior lean initially in standing; requiring Min A in order to maintain upright posture.    Ambulation/Gait Ambulation/Gait assistance: Mod assist, Min assist Gait Distance (Feet): 20 Feet Assistive device: Rolling walker (2 wheels) Gait Pattern/deviations: Step-to pattern, Decreased stride length, Festinating   Gait velocity interpretation: <1.8 ft/sec, indicate of risk for recurrent falls   General Gait Details: Pt given cues for BIG steps and  movements in order to break festinating pattern intermittent when ambulating. He also responded with longer stride length with tactile cues for pushing RW forward at consistent pace without breaking.  Stairs            Wheelchair Mobility     Tilt Bed    Modified Rankin (Stroke Patients Only)       Balance Overall balance assessment: Mild deficits observed, not formally tested, Needs assistance Sitting-balance support: Feet supported, No upper extremity supported Sitting balance-Leahy Scale: Good     Standing balance support: Reliant on assistive device for balance Standing balance-Leahy Scale: Fair                               Pertinent Vitals/Pain Pain Assessment Pain Assessment: No/denies pain    Home Living Family/patient expects to be discharged to:: Assisted living                 Home Equipment: Rollator (4 wheels);Cane - single point;Shower seat;Grab bars - toilet;Grab bars - tub/shower;Hand held shower head;Wheelchair - manual Additional Comments: Pt reports  that he has had fall with and without the use of RW (rollator)    Prior Function Prior Level of Function : History of Falls (last six months);Needs assist  Cognitive Assist : Mobility (cognitive) Mobility (Cognitive): Intermittent cues   Physical Assist : Mobility (physical) Mobility (physical): Gait;Transfers            Extremity/Trunk Assessment   Upper Extremity Assessment Upper Extremity Assessment: Overall WFL for tasks assessed    Lower Extremity Assessment Lower Extremity Assessment: Overall WFL for tasks assessed       Communication   Communication Communication: Difficulty communicating thoughts/reduced clarity of speech (Reduced clarity of speech)  Cognition Arousal: Alert Behavior During Therapy: WFL for tasks assessed/performed Overall Cognitive Status: Within Functional Limits for tasks assessed                                          General Comments      Exercises General Exercises - Lower Extremity Hip Flexion/Marching: Standing, AROM, Other reps (comment) (2 sets of standing marching for as long as the patient could tolerate as a warm up.)   Assessment/Plan    PT Assessment Patient needs continued PT services  PT Problem List Decreased strength;Decreased balance;Decreased mobility;Decreased coordination;Decreased cognition;Decreased safety awareness       PT Treatment Interventions Gait training;Functional mobility training;Therapeutic exercise;Therapeutic activities;Neuromuscular re-education;Balance training;Patient/family education    PT Goals (Current goals can be found in the Care Plan section)  Acute Rehab PT Goals Patient Stated Goal: sit outside PT Goal Formulation: With patient Time For Goal Achievement: 08/05/23 Potential to Achieve Goals: Good    Frequency Min 3X/week     Co-evaluation               AM-PAC PT "6 Clicks" Mobility  Outcome Measure Help needed turning from your back to your side while in a flat bed without using bedrails?: A Little Help needed moving from lying on your back to sitting on the side of a flat bed without using bedrails?: A Lot Help needed moving to and from a bed to a chair (including a wheelchair)?: A Little Help needed standing up from a chair using your arms (e.g., wheelchair or bedside  chair)?: A Little Help needed to walk in hospital room?: A Little Help needed climbing 3-5 steps with a railing? : A Lot 6 Click Score: 16    End of Session Equipment Utilized During Treatment: Gait belt Activity Tolerance: Patient tolerated treatment well Patient left: in bed;with call bell/phone within reach;with bed alarm set Nurse Communication: Mobility status PT Visit Diagnosis: Unsteadiness on feet (R26.81);Repeated falls (R29.6)    Time: 6962-9528 PT Time Calculation (min) (ACUTE ONLY): 37 min   Charges:   PT Evaluation $PT Eval Low Complexity: 1 Low PT Treatments $Gait Training: 23-37 mins PT General Charges $$ ACUTE PT VISIT: 1 Visit         12:34 PM, 07/22/23 Brantley Wiley A. Mordecai Maes PT, DPT Physical Therapist - [facility] Tirzah Fross 07/22/2023, 12:31 PM

## 2023-07-22 NOTE — Care Management Obs Status (Signed)
MEDICARE OBSERVATION STATUS NOTIFICATION   Patient Details  Name: Corey Morrison MRN: [id number] Date of Birth: [birth year]   Medicare Observation Status Notification Given:  Yes    Dieter Hane, LCSW 07/22/2023, 3:01 PM

## 2023-07-22 NOTE — Plan of Care (Signed)
  Problem: Clinical Measurements: Goal: Ability to maintain clinical measurements within normal limits will improve Outcome: Progressing Goal: Diagnostic test results will improve Outcome: Progressing Goal: Respiratory complications will improve Outcome: Progressing Goal: Cardiovascular complication will be avoided Outcome: Progressing   Problem: Nutrition: Goal: Adequate nutrition will be maintained Outcome: Progressing   Problem: Coping: Goal: Level of anxiety will decrease Outcome: Progressing   Problem: Pain Managment: Goal: General experience of comfort will improve Outcome: Progressing

## 2023-07-22 NOTE — TOC Transition Note (Addendum)
Transition of Care [facility]) - CM/SW Discharge Note   Patient Details  Name: Corey Morrison MRN: [id number] Date of Birth: May 14, 1956  [facility] CM/SW Contact:  Allena Katz, LCSW Phone Number: 07/22/2023, 3:01 PM   Clinical Narrative:  CSW spoke with POA who reports that he would like for patient to go to the [facility] where he came from rather than to go to rehab. CSW will follow up with Amalia Hailey at the [facility] to see if he is able to accept patient back.    3:14pm CSW spoke with Amalia Hailey who reports he is able to take pt back when ready. MD notified.         Patient Goals and CMS Choice      Discharge Placement                         Discharge Plan and Services Additional resources added to the After Visit Summary for                                       Social Determinants of Health (SDOH) Interventions SDOH Screenings   Food Insecurity: Patient Unable To Answer (07/21/2023)  Housing: Patient Unable To Answer (07/21/2023)  Transportation Needs: Patient Unable To Answer (07/21/2023)  Utilities: Patient Unable To Answer (07/21/2023)  Social Connections: Unknown (01/24/2023)   Received from [facility]  Tobacco Use: Medium Risk (07/21/2023)     Readmission Risk Interventions    07/23/2022    4:25 PM  Readmission Risk Prevention Plan  Transportation Screening Complete  Medication Review (RN Care Manager) Complete  PCP or Specialist appointment within 3-5 days of discharge Complete  HRI or Home Care Consult Complete  SW Recovery Care/Counseling Consult Complete  Palliative Care Screening Complete  Skilled Nursing Facility Complete

## 2023-07-22 NOTE — Progress Notes (Addendum)
Progress Note    Corey Morrison  ZOX:[id number] DOB: [birth year]  DOA: 07/21/2023 PCP: [facility]      Brief Narrative:    Medical records reviewed and are as summarized below:  Corey Morrison is a 67 y.o. male with medical history significant of [name] disease, orthostatic hypotension, PAF, chronic ambulation dysfunction with recurrent falls, sent from nursing home for evaluation of worsening of generalized weakness, frequent falls, nausea and vomiting.  Reportedly, he had fallen at the nursing home 2 days prior to admission.  Chest x-ray on admission was concerning for pneumonia.     Assessment/Plan:   Principal Problem:   Aspiration pneumonia (HCC) Active Problems:   Orthostatic hypotension   Acute metabolic encephalopathy   [name]'s disease    Body mass index is 21.88 kg/m.   Aspiration pneumonia: Continue IV Unasyn.  Antitussives as needed.  Consult speech therapist for swallow evaluation.   Confusion: It is not clear what his baseline is.  Probable acute metabolic encephalopathy versus underlying cognitive impairment/dementia.  Continue to monitor.   Acute on chronic orthostatic hypotension: Continue midodrine   Nausea and vomiting: Improved.  Antiemetics as needed.   [name]'s disease, ambulatory dysfunction: Continue Sinemet.  PT and OT evaluation.   Paroxysmal atrial fibrillation: He is not on long-term anticoagulation because of frequent falls.   Other comorbidities include CAD, chronic anemia      Diet Order             Diet Heart Room service appropriate? Yes; Fluid consistency: Thin  Diet effective now                            Consultants: None  Procedures: None    Medications:    acetaminophen  650 mg Oral Q8H   aspirin EC  81 mg Oral Daily   bisacodyl  10 mg Rectal Once   carbidopa-levodopa  2 tablet Oral 5 X Daily   enoxaparin (LOVENOX) injection  40 mg Subcutaneous Q24H   entacapone   200 mg Oral 6 X Daily   gabapentin  200 mg Oral TID   guaiFENesin  600 mg Oral BID   melatonin  5 mg Oral QHS   midodrine  10 mg Oral TID WC   pantoprazole  40 mg Oral QAC breakfast   sertraline  150 mg Oral Daily   Continuous Infusions:  ampicillin-sulbactam (UNASYN) IV Stopped (07/22/23 1020)     Anti-infectives (From admission, onward)    Start     Dose/Rate Route Frequency Ordered Stop   07/21/23 1900  Ampicillin-Sulbactam (UNASYN) 3 g in sodium chloride 0.9 % 100 mL IVPB        3 g 200 mL/hr over 30 Minutes Intravenous Every 6 hours 07/21/23 1337     07/21/23 1245  Ampicillin-Sulbactam (UNASYN) 3 g in sodium chloride 0.9 % 100 mL IVPB        3 g 200 mL/hr over 30 Minutes Intravenous  Once 07/21/23 1232 07/21/23 1330              Family Communication/Anticipated D/C date and plan/Code Status   DVT prophylaxis: enoxaparin (LOVENOX) injection 40 mg Start: 07/21/23 2200     Code Status: DNR  Family Communication: None Disposition Plan: Plan to discharge to SNF   Status is: Observation The patient will require care spanning > 2 midnights and should be moved to inpatient because: On IV antibiotics for pneumonia  Subjective:   Interval events noted.  He says he is "confused".  No other complaints.  No shortness of breath, cough or chest pain.  Occupational therapist was at the bedside.  Objective:    Vitals:   07/21/23 2000 07/21/23 2336 07/22/23 0422 07/22/23 0744  BP: 129/70 (!) 141/73 114/66 (!) 145/73  Pulse: 80 70 69 69  Resp: 20 20 12 16   Temp: 98.1 F (36.7 C) 99.1 F (37.3 C) 98.6 F (37 C) 98.1 F (36.7 C)  TempSrc: Oral Oral Oral   SpO2: 99% 99% 96% 99%  Height:       No data found.   Intake/Output Summary (Last 24 hours) at 07/22/2023 1126 Last data filed at 07/22/2023 1020 Gross per 24 hour  Intake 123.77 ml  Output 125 ml  Net -1.23 ml   There were no vitals filed for this visit.  Exam:  GEN: NAD SKIN: Warm and  dry EYES: No pallor or icterus ENT: MMM CV: RRR PULM: CTA B ABD: soft, ND, NT, +BS CNS: AAO x 1 (person), non focal EXT: No edema or tenderness        Data Reviewed:   I have personally reviewed following labs and imaging studies:  Labs: Labs show the following:   Basic Metabolic Panel: Recent Labs  Lab 07/21/23 1014  NA 141  K 3.6  CL 106  CO2 26  GLUCOSE 115*  BUN 26*  CREATININE 1.09  CALCIUM 9.1  MG 2.6*   GFR Estimated Creatinine Clearance: 62.5 mL/min (by C-G formula based on SCr of 1.09 mg/dL). Liver Function Tests: Recent Labs  Lab 07/21/23 1014  AST 13*  ALT <5  ALKPHOS 59  BILITOT 0.5  PROT 7.1  ALBUMIN 3.7   Recent Labs  Lab 07/21/23 1014  LIPASE 41   No results for input(s): "AMMONIA" in the last 168 hours. Coagulation profile No results for input(s): "INR", "PROTIME" in the last 168 hours.  CBC: Recent Labs  Lab 07/16/23 0525 07/21/23 1014 07/22/23 0504  WBC 9.1 7.1 7.7  NEUTROABS  --  6.2  --   HGB 9.3* 10.8* 9.6*  HCT 27.8* 32.9* 30.0*  MCV 89.7 90.6 92.0  PLT 144* 175 165   Cardiac Enzymes: No results for input(s): "CKTOTAL", "CKMB", "CKMBINDEX", "TROPONINI" in the last 168 hours. BNP (last 3 results) No results for input(s): "PROBNP" in the last 8760 hours. CBG: Recent Labs  Lab 07/15/23 1135 07/17/23 0512  GLUCAP 131* 75   D-Dimer: No results for input(s): "DDIMER" in the last 72 hours. Hgb A1c: No results for input(s): "HGBA1C" in the last 72 hours. Lipid Profile: No results for input(s): "CHOL", "HDL", "LDLCALC", "TRIG", "CHOLHDL", "LDLDIRECT" in the last 72 hours. Thyroid function studies: No results for input(s): "TSH", "T4TOTAL", "T3FREE", "THYROIDAB" in the last 72 hours.  Invalid input(s): "FREET3" Anemia work up: No results for input(s): "VITAMINB12", "FOLATE", "FERRITIN", "TIBC", "IRON", "RETICCTPCT" in the last 72 hours. Sepsis Labs: Recent Labs  Lab 07/16/23 0525 07/21/23 1014 07/22/23 0504   WBC 9.1 7.1 7.7    Microbiology Recent Results (from the past 240 hour(s))  Culture, blood (Routine X 2) w Reflex to ID Panel     Status: None   Collection Time: 07/14/23 12:09 PM   Specimen: BLOOD  Result Value Ref Range Status   Specimen Description BLOOD RIGHT ANTECUBITAL  Final   Special Requests   Final    BOTTLES DRAWN AEROBIC AND ANAEROBIC Blood Culture adequate volume   Culture  Final    NO GROWTH 5 DAYS Performed at [facility], [address] [place], [place] [address]    Report Status 07/19/2023 FINAL  Final  Culture, blood (Routine X 2) w Reflex to ID Panel     Status: None   Collection Time: 07/14/23 12:20 PM   Specimen: BLOOD LEFT HAND  Result Value Ref Range Status   Specimen Description BLOOD LEFT HAND  Final   Special Requests   Final    BOTTLES DRAWN AEROBIC AND ANAEROBIC Blood Culture adequate volume   Culture   Final    NO GROWTH 5 DAYS Performed at [facility], [address] [place], [place] [address]    Report Status 07/19/2023 FINAL  Final  SARS Coronavirus 2 by RT PCR (hospital order, performed in [facility] hospital lab) *cepheid single result test* Anterior Nasal Swab     Status: None   Collection Time: 07/21/23 10:14 AM   Specimen: Anterior Nasal Swab  Result Value Ref Range Status   SARS Coronavirus 2 by RT PCR NEGATIVE NEGATIVE Final    Comment: (NOTE) SARS-CoV-2 target nucleic acids are NOT DETECTED.  The SARS-CoV-2 RNA is generally detectable in upper and lower respiratory specimens during the acute phase of infection. The lowest concentration of SARS-CoV-2 viral copies this assay can detect is 250 copies / mL. A negative result does not preclude SARS-CoV-2 infection and should not be used as the sole basis for treatment or other patient management decisions.  A negative result may occur with improper specimen collection / handling, submission of specimen other than nasopharyngeal swab, presence of viral  mutation(s) within the areas targeted by this assay, and inadequate number of viral copies (<250 copies / mL). A negative result must be combined with clinical observations, patient history, and epidemiological information.  Fact Sheet for Patients:   RoadLapTop.co.za  Fact Sheet for Healthcare Providers: http://kim-Denne.com/  This test is not yet approved or  cleared by the [place] FDA and has been authorized for detection and/or diagnosis of SARS-CoV-2 by FDA under an Emergency Use Authorization (EUA).  This EUA will remain in effect (meaning this test can be used) for the duration of the COVID-19 declaration under Section 564(b)(1) of the Act, 21 U.S.C. section 360bbb-3(b)(1), unless the authorization is terminated or revoked sooner.  Performed at [facility], [address] [place], [place] [address]     Procedures and diagnostic studies:  DG Abd 1 View  Result Date: 07/21/2023 CLINICAL DATA:  Nausea and vomiting EXAM: ABDOMEN - 1 VIEW COMPARISON:  None Available. FINDINGS: Moderate left-sided colonic stool. Gas seen in nondilated loops of small and large bowel. No obstruction. Diaphragm is clipped off the edge of the film. No definite free air on these supine radiographs. Curvature and degenerative changes of the spine. Presumed vascular calcifications in the pelvis. Overlapping cardiac leads. IMPRESSION: Limited x-rays. Nonspecific bowel gas pattern with moderate left-sided colonic stool. Electronically Signed   By: Karen Kays M.D.   On: 07/21/2023 14:23   DG Chest Portable 1 View  Result Date: 07/21/2023 CLINICAL DATA:  Shortness of breath, emesis, and increased weakness EXAM: PORTABLE CHEST 1 VIEW COMPARISON:  Chest radiograph dated 07/14/2023 FINDINGS: Lung volumes. Patchy bibasilar opacities. No pleural effusion or pneumothorax. Similar mildly enlarged cardiomediastinal silhouette. No acute osseous  abnormality. IMPRESSION: Low lung volumes with patchy bibasilar opacities, which may represent atelectasis, aspiration, or pneumonia. Electronically Signed   By: Agustin Cree M.D.   On: 07/21/2023 10:14  LOS: 0 days   Corey Morrison  [facility]   Pager on [email or web address]. If 7PM-7AM, please contact night-coverage at [email or web address]     07/22/2023, 11:26 AM

## 2023-07-23 DIAGNOSIS — J690 Pneumonitis due to inhalation of food and vomit: Secondary | ICD-10-CM | POA: Diagnosis not present

## 2023-07-23 MED ORDER — ENTACAPONE 200 MG PO TABS: 200.0000 mg | ORAL_TABLET | Freq: Every day | ORAL | Status: AC

## 2023-07-23 MED ORDER — SERTRALINE HCL 100 MG PO TABS: 150.0000 mg | ORAL_TABLET | Freq: Every day | ORAL | Status: AC

## 2023-07-23 MED ORDER — GABAPENTIN 100 MG PO CAPS: 200.0000 mg | ORAL_CAPSULE | Freq: Three times a day (TID) | ORAL | Status: AC

## 2023-07-23 MED ORDER — AMOXICILLIN-POT CLAVULANATE 875-125 MG PO TABS: 1.0000 | ORAL_TABLET | Freq: Two times a day (BID) | ORAL | 0 refills | Status: AC

## 2023-07-23 NOTE — Progress Notes (Signed)
MD order received in [facility] to discharge pt home with home health today; TOC previously established pt from [facility], will be returning to facility where they will set up Home Health services themselves; attempted to call report to [phone]; receptionist, Wandra Mannan took this writer's name and number in order to give report and initiate discharge proceedings

## 2023-07-23 NOTE — Plan of Care (Signed)

## 2023-07-23 NOTE — Evaluation (Signed)
Clinical/Bedside Swallow Evaluation Patient Details  Name: Corey Morrison MRN: [id number] Date of Birth: 1956/06/12  Today's Date: 07/23/2023 Time: SLP Start Time (ACUTE ONLY): 0850 SLP Stop Time (ACUTE ONLY): 0905 SLP Time Calculation (min) (ACUTE ONLY): 15 min  Past Medical History:  Past Medical History:  Diagnosis Date   A-fib (HCC)    Anxiety disorder    Coronary artery disease    Hyperlipidemia    [name] disease    Vitamin B12 deficiency    Past Surgical History:  Past Surgical History:  Procedure Laterality Date   CARDIAC CATHETERIZATION     HPI:  Corey Morrison is a 67 y.o. male admitted on 07/21/2023 with aspiration pneumonia. He was discharged from the hospital on 07/18/2023. He has a PMHx significant for [name]'s disease, paroxysmal atrial fibrillation, CAD, and recurrent falls/orthostasis presenting with syncope. CXR on admit, "Low lung volumes with patchy bibasilar opacities, which may  represent atelectasis, aspiration, or pneumonia."    Assessment / Plan / Recommendation  Clinical Impression  Pt seen for clinical swallowing evaluation. Pt alert and cooperative. Slow to respond at times. Mildly hoarse/hypophonic vocal quality and short rushes of speech with reduced intelligibility. 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 Restlessness and occasional jerking movements observed. RN present for end of assessment. Based on today's assessment, pt demonstrated an intact oral swallow. Pharyngeal swallow appeared [facility] per clinical assessment. Pt with an inconsistent ability to feed self during evaluation due to UE weakness/incoordination. Suspect AMS playing a role in pt's ability to feed self. Recommend continuation of a regular diet with thin liquids with safe swallowing strategies/aspiration precautions as outlined below. SLP to sign off as pt has no acute SLP needs at this time. Suspect pt is at or near swallowing baseline.      Aspiration  Risk  Mild aspiration risk    Diet Recommendation Regular;Thin liquid    Medication Administration: Whole meds with puree Supervision: Patient able to self feed;Staff to assist with self feeding;Full supervision/cueing for compensatory strategies Compensations: Minimize environmental distractions;Slow rate;Small sips/bites    Other  Recommendations Oral Care Recommendations: Oral care BID;Staff/trained caregiver to provide oral care    Recommendations for follow up therapy are one component of a multi-disciplinary discharge planning process, led by the attending physician.  Recommendations may be updated based on patient status, additional functional criteria and insurance authorization.  Follow up Recommendations No SLP follow up         Functional Status Assessment Patient has not had a recent decline in their functional status         Prognosis Prognosis for improved oropharyngeal function: Fair Barriers to Reach Goals: Cognitive deficits      Swallow Study   General Date of Onset: 07/20/23 HPI: Corey Morrison is a 67 y.o. male admitted on 07/21/2023 with aspiration pneumonia. He was discharged from the hospital on 07/18/2023. He has a PMHx significant for [name]'s disease, paroxysmal atrial fibrillation, CAD, and recurrent falls/orthostasis presenting with syncope. CXR on admit, "Low lung volumes with patchy bibasilar opacities, which may  represent atelectasis, aspiration, or pneumonia." Type of Study: Bedside Swallow Evaluation Previous Swallow Assessment: none Diet Prior to this Study: Regular;Thin liquids (Level 0) Temperature Spikes Noted: No Respiratory Status: Room air History of Recent Intubation: No Behavior/Cognition: Alert;Cooperative;Confused;Distractible;Requires cueing;Doesn't follow directions Oral Cavity Assessment: Dry Oral Care Completed by SLP: Yes Oral Cavity - Dentition: Adequate natural dentition Vision: Functional for self-feeding Self-Feeding  Abilities: Able to feed self;Needs assist;Needs set up Patient Positioning:  Upright in bed Baseline Vocal Quality: Low vocal intensity Volitional Cough: Strong Volitional Swallow: Unable to elicit    Oral/Motor/Sensory Function Overall Oral Motor/Sensory Function: Generalized oral weakness Lingual ROM:  (in all 4 quadrants; vs ability to follow commands) Lingual Strength: Reduced   Ice Chips Ice chips: Not tested   Thin Liquid Thin Liquid: Within functional limits Presentation: Cup;Straw    Nectar Thick Nectar Thick Liquid: Not tested   Honey Thick Honey Thick Liquid: Not tested   Puree Puree: Within functional limits Presentation: Self Fed Other Comments: steadying assistance for feeding   Solid     Solid: Within functional limits Presentation: Self Fed Other Comments: with fingers     Clyde Canterbury, M.S., CCC-SLP Speech-Language Pathologist [facility] [phone] (ASCOM)  Woodroe Chen 07/23/2023,12:32 PM

## 2023-07-23 NOTE — TOC Transition Note (Addendum)
Transition of Care [facility]) - CM/SW Discharge Note   Patient Details  Name: Corey Morrison MRN: [id number] Date of Birth: 21-Oct-1956  Transition of Care [facility]) CM/SW Contact:  Kemper Durie, RN Phone Number: 07/23/2023, 2:27 PM   Clinical Narrative:     Patient has discharge orders.  Call placed to [facility], confirmed patient is able to come back today.  FL2 and discharge summary faxed, advised bedside nurse to call report to med techs at the [facility].  Spoke with [place], [place], aware of discharge, [facility] does not have transportation on weekends, [name] will see if patient is strong enough to get into care for transport.  If patient is not able to stand and pivot safely when preparing for discharge, will proceed with EMS transport.    Update @ 1640:  Rick uncomfortable with transporting patient back to [facility], non-emergent [facility] called, patient is 2nd on the list, bedside nurse aware.  Medical necessity and facesheet completed and printed.   Final next level of care: Assisted Living Barriers to Discharge: Barriers Resolved   Patient Goals and CMS Choice      Discharge Placement                         Discharge Plan and Services Additional resources added to the After Visit Summary for                                       Social Determinants of Health (SDOH) Interventions SDOH Screenings   Food Insecurity: Patient Unable To Answer (07/21/2023)  Housing: Patient Unable To Answer (07/21/2023)  Transportation Needs: Patient Unable To Answer (07/21/2023)  Utilities: Patient Unable To Answer (07/21/2023)  Social Connections: Unknown (01/24/2023)   Received from [facility]  Tobacco Use: Medium Risk (07/21/2023)     Readmission Risk Interventions    07/23/2022    4:25 PM  Readmission Risk Prevention Plan  Transportation Screening Complete  Medication Review (RN Care Manager) Complete  PCP or Specialist appointment within 3-5 days of discharge Complete  HRI or Home Care  Consult Complete  SW Recovery Care/Counseling Consult Complete  Palliative Care Screening Complete  Skilled Nursing Facility Complete

## 2023-07-23 NOTE — Progress Notes (Signed)
Telephone call from [facility] [phone] Anish[place]; discharge report given; no questions voiced at this time; pt's discharge pending arrival of EMS for nonemergency transport; Monica with TOC to contact EMS

## 2023-07-23 NOTE — Discharge Summary (Addendum)
Physician Discharge Summary   Patient: Corey Morrison MRN: [id number] DOB: 09/13/1956  Admit date:     07/21/2023  Discharge date: 07/23/23  Discharge Physician: Lurene Shadow   PCP: [facility]   Recommendations at discharge:   Follow-up with PCP in 1 to 2 weeks  Discharge Diagnoses: Principal Problem:   Aspiration pneumonia (HCC) Active Problems:   Orthostatic hypotension   Acute metabolic encephalopathy   [name]'s disease  Resolved Problems:   * No resolved hospital problems. *  Hospital Course:  Corey Morrison is a 67 y.o. male with medical history significant of [name] disease, dementia, orthostatic hypotension, PAF, chronic ambulation dysfunction with recurrent falls, sent from nursing home for evaluation of worsening of generalized weakness, frequent falls, nausea and vomiting.  Reportedly, he had fallen at the nursing home 2 days prior to admission.  Chest x-ray on admission was concerning for pneumonia.    Assessment and Plan:   Aspiration pneumonia: Improved.  Speech therapist recommended regular diet.  He will be discharged on 2 days of Augmentin to complete 5 days of treatment.     Confusion, underlying dementia: He appears to be at his baseline.  Mr. Corey Morrison, [place], send the patient has dementia at baseline.       Acute on chronic orthostatic hypotension: Midodrine has been discontinued because of significantly elevated blood pressure.  Monitor orthostatic vital signs in the outpatient setting and use midodrine as needed.     Nausea and vomiting: Improved     [name]'s disease, ambulatory dysfunction: Continue Sinemet.  PT had recommended discharge to SNF but Mr. Corey Morrison, [place], wants patient to go back to [facility]. He understands that Mr. Shinabarger is at risk for falls and injury.    Paroxysmal atrial fibrillation: He is not on long-term anticoagulation because of frequent falls.    Other comorbidities include CAD, chronic anemia    His  condition has improved and is deemed stable for discharge to ALF today.  Discharge plan was discussed with Mr. Corey Morrison, [place], over the phone.        Consultants: None Procedures performed: None  Disposition: Assisted living Diet recommendation:  Discharge Diet Orders (From admission, onward)     Start     Ordered   07/23/23 0000  Diet - low sodium heart healthy        07/23/23 1343           Cardiac diet DISCHARGE MEDICATION: Allergies as of 07/23/2023   No Known Allergies      Medication List     STOP taking these medications    midodrine 2.5 MG tablet Commonly known as: PROAMATINE   psyllium 0.52 g capsule Commonly known as: REGULOID       TAKE these medications    acetaminophen 325 MG tablet Commonly known as: TYLENOL Take 650 mg by mouth every 6 (six) hours. 0800/1400/2000   amoxicillin-clavulanate 875-125 MG tablet Commonly known as: AUGMENTIN Take 1 tablet by mouth 2 (two) times daily for 2 days.   Antacid Liquid 200-200-20 MG/5ML suspension Generic drug: alum & mag hydroxide-simeth Take 30 mLs by mouth 4 (four) times daily as needed for indigestion or heartburn.   aspirin EC 81 MG tablet Take 81 mg by mouth daily.   Biofreeze 4 % Gel Generic drug: Menthol (Topical Analgesic) SMARTSIG:sparingly Topical 3 Times Daily   capsicum 0.075 % topical cream Commonly known as: ZOSTRIX Apply 1 Application topically 2 (two) times daily. (Apply to feet and thighs)   carbidopa-levodopa 25-100  MG tablet Commonly known as: SINEMET IR Take 2 tablets by mouth 5 (five) times daily. 0200/1000/1400/1800/2200   cyanocobalamin 1000 MCG tablet Commonly known as: VITAMIN B12 Take 1,000 mcg by mouth daily.   entacapone 200 MG tablet Commonly known as: COMTAN Take 1 tablet (200 mg total) by mouth 6 (six) times daily. 0200/0600/1000/1400/1800/2200   fluticasone 50 MCG/ACT nasal spray Commonly known as: FLONASE Place 1 spray into both nostrils daily.    gabapentin 100 MG capsule Commonly known as: NEURONTIN Take 2 capsules (200 mg total) by mouth 3 (three) times daily. 0800/1400/2000   guaifenesin 100 MG/5ML syrup Commonly known as: ROBITUSSIN Take 15 mLs by mouth every 6 (six) hours as needed for cough.   loperamide 2 MG tablet Commonly known as: IMODIUM A-D Take 4 mg by mouth as needed for diarrhea or loose stools.   magnesium hydroxide 400 MG/5ML suspension Commonly known as: MILK OF MAGNESIA Take 30 mLs by mouth daily as needed for mild constipation.   melatonin 5 MG Tabs Take 1 tablet (5 mg total) by mouth at bedtime.   omeprazole 20 MG capsule Commonly known as: PRILOSEC Take 20 mg by mouth daily.   polyethylene glycol 17 g packet Commonly known as: MIRALAX / GLYCOLAX Take 17 g by mouth daily as needed for mild constipation.   sertraline 100 MG tablet Commonly known as: ZOLOFT Take 1.5 tablets (150 mg total) by mouth daily.   Vitamin D 50 MCG (2000 UT) Caps Take 2,000 Units by mouth daily.        Discharge Exam:  GEN: NAD SKIN: Warm and dry EYES: No pallor or icterus ENT: MMM CV: RRR PULM: CTA B ABD: soft, ND, NT, +BS CNS: AAO x 2 ( person and place), non focal EXT: No edema or tenderness   Condition at discharge: stable  The results of significant diagnostics from this hospitalization (including imaging, microbiology, ancillary and laboratory) are listed below for reference.   Imaging Studies: DG Abd 1 View  Result Date: 07/21/2023 CLINICAL DATA:  Nausea and vomiting EXAM: ABDOMEN - 1 VIEW COMPARISON:  None Available. FINDINGS: Moderate left-sided colonic stool. Gas seen in nondilated loops of small and large bowel. No obstruction. Diaphragm is clipped off the edge of the film. No definite free air on these supine radiographs. Curvature and degenerative changes of the spine. Presumed vascular calcifications in the pelvis. Overlapping cardiac leads. IMPRESSION: Limited x-rays. Nonspecific bowel gas  pattern with moderate left-sided colonic stool. Electronically Signed   By: Karen Kays M.D.   On: 07/21/2023 14:23   DG Chest Portable 1 View  Result Date: 07/21/2023 CLINICAL DATA:  Shortness of breath, emesis, and increased weakness EXAM: PORTABLE CHEST 1 VIEW COMPARISON:  Chest radiograph dated 07/14/2023 FINDINGS: Lung volumes. Patchy bibasilar opacities. No pleural effusion or pneumothorax. Similar mildly enlarged cardiomediastinal silhouette. No acute osseous abnormality. IMPRESSION: Low lung volumes with patchy bibasilar opacities, which may represent atelectasis, aspiration, or pneumonia. Electronically Signed   By: Agustin Cree M.D.   On: 07/21/2023 10:14   DG Chest Port 1 View  Result Date: 07/14/2023 CLINICAL DATA:  Weakness. EXAM: PORTABLE CHEST 1 VIEW COMPARISON:  X-ray 06/10/2023 FINDINGS: Film is rotated to the right. No consolidation, pneumothorax or effusion. No edema. Normal cardiopericardial silhouette. Left basilar scar or atelectasis. IMPRESSION: Mild left basilar scar or atelectasis. Electronically Signed   By: Karen Kays M.D.   On: 07/14/2023 15:36   CT Head Wo Contrast  Result Date: 07/10/2023 CLINICAL DATA:  Provided history:  Neuro deficit, acute, stroke suspected. Additional history provided: Slurred speech, right arm drift/weakness. History of seizure. EXAM: CT HEAD WITHOUT CONTRAST TECHNIQUE: Contiguous axial images were obtained from the base of the skull through the vertex without intravenous contrast. RADIATION DOSE REDUCTION: This exam was performed according to the departmental dose-optimization program which includes automated exposure control, adjustment of the mA and/or kV according to patient size and/or use of iterative reconstruction technique. COMPARISON:  Head CT 06/10/2023. FINDINGS: Mildly motion degraded exam. Within this limitation, findings are as follows. Brain: No age advanced or lobar predominant parenchymal atrophy. Patchy and ill-defined hypoattenuation  within the cerebral white matter, nonspecific but compatible with mild chronic small vessel ischemic disease. There is no acute intracranial hemorrhage. No demarcated cortical infarct. No extra-axial fluid collection. No evidence of an intracranial mass. No midline shift. Vascular: No hyperdense vessel.  Atherosclerotic calcifications. Skull: No calvarial fracture or aggressive osseous lesion. Sinuses/Orbits: No mass or acute finding within the imaged orbits. Small-volume frothy secretions within the right sphenoid sinus. Minimal mucosal thickening within the right maxillary sinus at the imaged levels. IMPRESSION: 1. Mildly motion degraded exam. 2.  No evidence of an acute intracranial abnormality. 3. Mild chronic small vessel ischemic changes within the cerebral white matter. 4. Paranasal sinus disease at the imaged levels, as described. Electronically Signed   By: Jackey Loge D.O.   On: 07/10/2023 10:55    Microbiology: Results for orders placed or performed during the hospital encounter of 07/21/23  SARS Coronavirus 2 by RT PCR (hospital order, performed in [facility] hospital lab) *cepheid single result test* Anterior Nasal Swab     Status: None   Collection Time: 07/21/23 10:14 AM   Specimen: Anterior Nasal Swab  Result Value Ref Range Status   SARS Coronavirus 2 by RT PCR NEGATIVE NEGATIVE Final    Comment: (NOTE) SARS-CoV-2 target nucleic acids are NOT DETECTED.  The SARS-CoV-2 RNA is generally detectable in upper and lower respiratory specimens during the acute phase of infection. The lowest concentration of SARS-CoV-2 viral copies this assay can detect is 250 copies / mL. A negative result does not preclude SARS-CoV-2 infection and should not be used as the sole basis for treatment or other patient management decisions.  A negative result may occur with improper specimen collection / handling, submission of specimen other than nasopharyngeal swab, presence of viral mutation(s) within  the areas targeted by this assay, and inadequate number of viral copies (<250 copies / mL). A negative result must be combined with clinical observations, patient history, and epidemiological information.  Fact Sheet for Patients:   RoadLapTop.co.za  Fact Sheet for Healthcare Providers: http://kim-[place].com/  This test is not yet approved or  cleared by the [place] FDA and has been authorized for detection and/or diagnosis of SARS-CoV-2 by FDA under an Emergency Use Authorization (EUA).  This EUA will remain in effect (meaning this test can be used) for the duration of the COVID-19 declaration under Section 564(b)(1) of the Act, 21 U.S.C. section 360bbb-3(b)(1), unless the authorization is terminated or revoked sooner.  Performed at [facility], [address] [place], [place] [address]     Labs: CBC: Recent Labs  Lab 07/21/23 1014 07/22/23 0504  WBC 7.1 7.7  NEUTROABS 6.2  --   HGB 10.8* 9.6*  HCT 32.9* 30.0*  MCV 90.6 92.0  PLT 175 165   Basic Metabolic Panel: Recent Labs  Lab 07/21/23 1014  NA 141  K 3.6  CL 106  CO2 26  GLUCOSE 115*  BUN 26*  CREATININE 1.09  CALCIUM 9.1  MG 2.6*   Liver Function Tests: Recent Labs  Lab 07/21/23 1014  AST 13*  ALT <5  ALKPHOS 59  BILITOT 0.5  PROT 7.1  ALBUMIN 3.7   CBG: Recent Labs  Lab 07/17/23 0512  GLUCAP 75    Discharge time spent: greater than 30 minutes.  Signed: Lurene Shadow, MD [facility] 07/23/2023

## 2023-07-23 NOTE — NC FL2 (Addendum)
[place] MEDICAID FL2 LEVEL OF CARE FORM     IDENTIFICATION  Patient Name: Corey Morrison Birthdate: [birth year] Sex: male Admission Date (Current Location): 07/21/2023  [place] and [place] Number:  Chiropodist and Address:  [facility], [address] [place], [place] [address]      Provider Number: [id number]  Attending Physician Name and Address:  Lurene Shadow, MD  Relative Name and Phone Number:  Johnson,Rick [facility]) (Friend)  [phone]    Current Level of Care: Hospital Recommended Level of Care: Assisted Living Facility Prior Approval Number:    Date Approved/Denied:   PASRR Number:    Discharge Plan: Other (Comment) ([facility], [facility])    Current Diagnoses: Patient Active Problem List   Diagnosis Date Noted   Aspiration pneumonia (HCC) 07/21/2023   Coronary artery disease 07/15/2023   GERD (gastroesophageal reflux disease) 07/15/2023   Sepsis (HCC) 07/15/2023   Secondary hypercoagulability disorder (HCC) 07/15/2023   Altered mental status 02/04/2023   Fall 02/03/2023   Depression with anxiety 02/03/2023   Hypotension 02/03/2023   Leukocytosis 02/03/2023   Acute metabolic encephalopathy 02/03/2023   Prolonged QT interval 02/03/2023   Hallucination 07/27/2022   Peripheral neuropathy 07/27/2022   [name] disease, symptomatic 07/23/2022   Dyslipidemia 07/21/2022   Vitamin B12 deficiency 07/21/2022   Depression 07/21/2022   Nocturnal cough 06/25/2022   Insomnia 06/25/2022   Orthostatic hypotension 06/23/2022   Paroxysmal atrial fibrillation (HCC) 06/23/2022   Normocytic anemia 06/23/2022   Ambulatory dysfunction 06/22/2022   [name]'s disease 06/11/2022    Orientation RESPIRATION BLADDER Height & Weight     Self  Normal Incontinent Weight:   Height:  5\' 9"  (175.3 cm)  BEHAVIORAL SYMPTOMS/MOOD NEUROLOGICAL BOWEL NUTRITION STATUS      Continent Diet  AMBULATORY STATUS COMMUNICATION OF NEEDS Skin   Limited Assist  Verbally Normal                       Personal Care Assistance Level of Assistance  Bathing, Feeding, Dressing Bathing Assistance: Limited assistance Feeding assistance: Limited assistance Dressing Assistance: Limited assistance     Functional Limitations Info             SPECIAL CARE FACTORS FREQUENCY                       Contractures Contractures Info: Not present    Additional Factors Info  Code Status, Allergies, Psychotropic Code Status Info: DNR Allergies Info: NKA Psychotropic Info: Zoloft        Discharge Medications: DISCHARGE MEDICATION: Allergies as of 07/23/2023   No Known Allergies         Medication List       STOP taking these medications     midodrine 2.5 MG tablet Commonly known as: PROAMATINE    psyllium 0.52 g capsule Commonly known as: REGULOID           TAKE these medications     acetaminophen 325 MG tablet Commonly known as: TYLENOL Take 650 mg by mouth every 6 (six) hours. 0800/1400/2000    amoxicillin-clavulanate 875-125 MG tablet Commonly known as: AUGMENTIN Take 1 tablet by mouth 2 (two) times daily for 2 days.    Antacid Liquid 200-200-20 MG/5ML suspension Generic drug: alum & mag hydroxide-simeth Take 30 mLs by mouth 4 (four) times daily as needed for indigestion or heartburn.    aspirin EC 81 MG tablet Take 81 mg by mouth daily.  Biofreeze 4 % Gel Generic drug: Menthol (Topical Analgesic) SMARTSIG:sparingly Topical 3 Times Daily    capsicum 0.075 % topical cream Commonly known as: ZOSTRIX Apply 1 Application topically 2 (two) times daily. (Apply to feet and thighs)    carbidopa-levodopa 25-100 MG tablet Commonly known as: SINEMET IR Take 2 tablets by mouth 5 (five) times daily. 0200/1000/1400/1800/2200    cyanocobalamin 1000 MCG tablet Commonly known as: VITAMIN B12 Take 1,000 mcg by mouth daily.    entacapone 200 MG tablet Commonly known as: COMTAN Take 1 tablet (200 mg total) by mouth 6  (six) times daily. 0200/0600/1000/1400/1800/2200    fluticasone 50 MCG/ACT nasal spray Commonly known as: FLONASE Place 1 spray into both nostrils daily.    gabapentin 100 MG capsule Commonly known as: NEURONTIN Take 2 capsules (200 mg total) by mouth 3 (three) times daily. 0800/1400/2000    guaifenesin 100 MG/5ML syrup Commonly known as: ROBITUSSIN Take 15 mLs by mouth every 6 (six) hours as needed for cough.    loperamide 2 MG tablet Commonly known as: IMODIUM A-D Take 4 mg by mouth as needed for diarrhea or loose stools.    magnesium hydroxide 400 MG/5ML suspension Commonly known as: MILK OF MAGNESIA Take 30 mLs by mouth daily as needed for mild constipation.    melatonin 5 MG Tabs Take 1 tablet (5 mg total) by mouth at bedtime.    omeprazole 20 MG capsule Commonly known as: PRILOSEC Take 20 mg by mouth daily.    polyethylene glycol 17 g packet Commonly known as: MIRALAX / GLYCOLAX Take 17 g by mouth daily as needed for mild constipation.    sertraline 100 MG tablet Commonly known as: ZOLOFT Take 1.5 tablets (150 mg total) by mouth daily.    Vitamin D 50 MCG (2000 UT) Caps Take 2,000 Units by mouth daily.    Relevant Imaging Results:  Relevant Lab Results:   Additional Information SSN [id number]  Kemper Durie, RN

## 2023-07-23 NOTE — NC FL2 (Signed)
[place] MEDICAID FL2 LEVEL OF CARE FORM     IDENTIFICATION  Patient Name: Corey Morrison Birthdate: [birth year] Sex: male Admission Date (Current Location): 07/21/2023  [place] and [place] Number:  Chiropodist and Address:  [facility], [address] [place], [place] [address]      Provider Number: [id number]  Attending Physician Name and Address:  Lurene Shadow, MD  Relative Name and Phone Number:  Johnson,Rick [facility]) (Friend)  [phone]    Current Level of Care: Hospital Recommended Level of Care: Assisted Living Facility Prior Approval Number:    Date Approved/Denied:   PASRR Number:    Discharge Plan: Other (Comment) ([facility], [facility])    Current Diagnoses: Patient Active Problem List   Diagnosis Date Noted   Aspiration pneumonia (HCC) 07/21/2023   Coronary artery disease 07/15/2023   GERD (gastroesophageal reflux disease) 07/15/2023   Sepsis (HCC) 07/15/2023   Secondary hypercoagulability disorder (HCC) 07/15/2023   Altered mental status 02/04/2023   Fall 02/03/2023   Depression with anxiety 02/03/2023   Hypotension 02/03/2023   Leukocytosis 02/03/2023   Acute metabolic encephalopathy 02/03/2023   Prolonged QT interval 02/03/2023   Hallucination 07/27/2022   Peripheral neuropathy 07/27/2022   [name] disease, symptomatic 07/23/2022   Dyslipidemia 07/21/2022   Vitamin B12 deficiency 07/21/2022   Depression 07/21/2022   Nocturnal cough 06/25/2022   Insomnia 06/25/2022   Orthostatic hypotension 06/23/2022   Paroxysmal atrial fibrillation (HCC) 06/23/2022   Normocytic anemia 06/23/2022   Ambulatory dysfunction 06/22/2022   [name]'s disease 06/11/2022    Orientation RESPIRATION BLADDER Height & Weight     Self  Normal Incontinent Weight:   Height:  5\' 9"  (175.3 cm)  BEHAVIORAL SYMPTOMS/MOOD NEUROLOGICAL BOWEL NUTRITION STATUS      Continent Diet  AMBULATORY STATUS COMMUNICATION OF NEEDS Skin   Limited Assist  Verbally Normal                       Personal Care Assistance Level of Assistance  Bathing, Feeding, Dressing Bathing Assistance: Limited assistance Feeding assistance: Limited assistance Dressing Assistance: Limited assistance     Functional Limitations Info             SPECIAL CARE FACTORS FREQUENCY                       Contractures Contractures Info: Not present    Additional Factors Info  Code Status, Allergies, Psychotropic Code Status Info: DNR Allergies Info: NKA Psychotropic Info: Zoloft        Discharge Medications: DISCHARGE MEDICATION: Allergies as of 07/23/2023   No Known Allergies         Medication List       STOP taking these medications     midodrine 2.5 MG tablet Commonly known as: PROAMATINE    psyllium 0.52 g capsule Commonly known as: REGULOID           TAKE these medications     acetaminophen 325 MG tablet Commonly known as: TYLENOL Take 650 mg by mouth every 6 (six) hours. 0800/1400/2000    amoxicillin-clavulanate 875-125 MG tablet Commonly known as: AUGMENTIN Take 1 tablet by mouth 2 (two) times daily for 2 days.    Antacid Liquid 200-200-20 MG/5ML suspension Generic drug: alum & mag hydroxide-simeth Take 30 mLs by mouth 4 (four) times daily as needed for indigestion or heartburn.    aspirin EC 81 MG tablet Take 81 mg by mouth daily.  Biofreeze 4 % Gel Generic drug: Menthol (Topical Analgesic) SMARTSIG:sparingly Topical 3 Times Daily    capsicum 0.075 % topical cream Commonly known as: ZOSTRIX Apply 1 Application topically 2 (two) times daily. (Apply to feet and thighs)    carbidopa-levodopa 25-100 MG tablet Commonly known as: SINEMET IR Take 2 tablets by mouth 5 (five) times daily. 0200/1000/1400/1800/2200    cyanocobalamin 1000 MCG tablet Commonly known as: VITAMIN B12 Take 1,000 mcg by mouth daily.    entacapone 200 MG tablet Commonly known as: COMTAN Take 1 tablet (200 mg total) by mouth 6  (six) times daily. 0200/0600/1000/1400/1800/2200    fluticasone 50 MCG/ACT nasal spray Commonly known as: FLONASE Place 1 spray into both nostrils daily.    gabapentin 100 MG capsule Commonly known as: NEURONTIN Take 2 capsules (200 mg total) by mouth 3 (three) times daily. 0800/1400/2000    guaifenesin 100 MG/5ML syrup Commonly known as: ROBITUSSIN Take 15 mLs by mouth every 6 (six) hours as needed for cough.    loperamide 2 MG tablet Commonly known as: IMODIUM A-D Take 4 mg by mouth as needed for diarrhea or loose stools.    magnesium hydroxide 400 MG/5ML suspension Commonly known as: MILK OF MAGNESIA Take 30 mLs by mouth daily as needed for mild constipation.    melatonin 5 MG Tabs Take 1 tablet (5 mg total) by mouth at bedtime.    omeprazole 20 MG capsule Commonly known as: PRILOSEC Take 20 mg by mouth daily.    polyethylene glycol 17 g packet Commonly known as: MIRALAX / GLYCOLAX Take 17 g by mouth daily as needed for mild constipation.    sertraline 100 MG tablet Commonly known as: ZOLOFT Take 1.5 tablets (150 mg total) by mouth daily.    Vitamin D 50 MCG (2000 UT) Caps Take 2,000 Units by mouth daily.    Relevant Imaging Results:  Relevant Lab Results:   Additional Information SSN [id number]  Kemper Durie, RN

## 2023-07-23 NOTE — Progress Notes (Signed)
EMS present for pt discharge; discharge packet given to EMS personnel to take to [facility]; pt discharged via stretcher by EMS to [facility]

## 2023-08-10 ENCOUNTER — Emergency Department
Admission: EM | Admit: 2023-08-10 | Discharge: 2023-08-10 | Disposition: A | Discharge status: Skilled Nursing Facility | Attending: Emergency Medicine | Admitting: Emergency Medicine

## 2023-08-10 ENCOUNTER — Emergency Department

## 2023-08-10 ENCOUNTER — Other Ambulatory Visit: Payer: Self-pay

## 2023-08-10 DIAGNOSIS — S0081XA Abrasion of other part of head, initial encounter: Secondary | ICD-10-CM | POA: Insufficient documentation

## 2023-08-10 DIAGNOSIS — W19XXXA Unspecified fall, initial encounter: Secondary | ICD-10-CM | POA: Diagnosis not present

## 2023-08-10 DIAGNOSIS — S0990XA Unspecified injury of head, initial encounter: Secondary | ICD-10-CM

## 2023-08-10 DIAGNOSIS — F0280 Dementia in other diseases classified elsewhere without behavioral disturbance: Secondary | ICD-10-CM | POA: Diagnosis not present

## 2023-08-10 DIAGNOSIS — G20C Parkinsonism, unspecified: Secondary | ICD-10-CM | POA: Diagnosis not present

## 2023-08-10 NOTE — ED Triage Notes (Signed)
Pt from [facility] via [facility] for mechanical, witnessed fall. No LOC, no blood thinners. Pt hit head and has abrasion above left eye. Denies pain. Hx of dementia, pt reported to be at baseline. EMS vitals: BP 108/58 HR 63 97% RA CBG 158

## 2023-08-10 NOTE — ED Provider Notes (Signed)
[facility] Provider Note    Event Date/Time   First MD Initiated Contact with Patient 08/10/23 [phone]     (approximate)  History   Chief Complaint: Fall  HPI  Corey Morrison is a 67 y.o. male with a past medical history of atrial fibrillation, anxiety, [name]'s, presents to the emergency department after a fall at his nursing facility.  According to report patient is coming from the [facility] [place] nursing facility.  Patient had a witnessed fall in which he hit his head.  No LOC no reported vomiting.  Patient has a history of dementia but is in no distress and has no complaints currently.  Physical Exam   Triage Vital Signs: ED Triage Vitals  Encounter Vitals Group     BP 08/10/23 0750 (!) 134/112     Systolic BP Percentile --      Diastolic BP Percentile --      Pulse Rate 08/10/23 0750 69     Resp 08/10/23 0750 20     Temp 08/10/23 0752 (!) 97.5 F (36.4 C)     Temp Source 08/10/23 0752 Oral     SpO2 08/10/23 0750 100 %     Weight --      Height --      Head Circumference --      Peak Flow --      Pain Score 08/10/23 0752 2     Pain Loc --      Pain Education --      Exclude from Growth Chart --     Most recent vital signs: Vitals:   08/10/23 0750 08/10/23 0752  BP: (!) 134/112   Pulse: 69   Resp: 20   Temp:  (!) 97.5 F (36.4 C)  SpO2: 100%     General: Awake, no distress.  Patient does have an abrasion above the left eye/eyebrow, now hemostatic CV:  Good peripheral perfusion.  Regular rate and rhythm  Resp:  Normal effort.  Equal breath sounds bilaterally.  Abd:  No distention.  Soft, nontender.   Other:  Good range of motion in extremities without pain elicited.   ED Results / Procedures / Treatments   RADIOLOGY  I have seen and evaluated the CT head images.  No bleed seen on my evaluation. Radiology is read the CT scan of the head and C-spine is negative for acute abnormality.   MEDICATIONS ORDERED IN ED: Medications  - No data to display   IMPRESSION / MDM / ASSESSMENT AND PLAN / ED COURSE  I reviewed the triage vital signs and the nursing notes.  Patient's presentation is most consistent with acute presentation with potential threat to life or bodily function.  Patient presents to the emergency department after mechanical fall, no LOC or vomiting.  Currently the patient appears well he is awake he is alert he is in no distress and does not appear to be in any pain.  He does have an abrasion to his left eyebrow and above his left eyebrow but no laceration.  There is dried blood on the face but the wounds are now hemostatic.  We will obtain CT imaging of the head and C-spine as a precaution.  Good range of motion in both hips bilateral upper and lower extremities.  No other obvious injuries on examination.  Patient CT scan of the head and C-spine are negative.  Given the patient's reassuring workup reassuring physical exam we will discharge home.  Recommend Neosporin to  the abrasion twice daily for the next 5 days.  FINAL CLINICAL IMPRESSION(S) / ED DIAGNOSES   Fall Head injury Abrasion   Note:  This document was prepared using Dragon voice recognition software and may include unintentional dictation errors.   Minna Antis, MD 08/10/23 [phone]

## 2023-08-10 NOTE — ED Notes (Signed)
Called c com for transport to [facility] [address]

## 2023-08-10 NOTE — Discharge Instructions (Addendum)
Please apply Neosporin to the abrasion twice daily for the next 5 days.  Return to the emergency department for any symptom personally concerning to yourself or staff members.

## 2023-08-18 ENCOUNTER — Encounter: Payer: Self-pay | Admitting: Emergency Medicine

## 2023-08-18 ENCOUNTER — Emergency Department
Admission: EM | Admit: 2023-08-18 | Discharge: 2023-08-18 | Disposition: A | Discharge status: Home / Self Care | Attending: Emergency Medicine | Admitting: Emergency Medicine

## 2023-08-18 ENCOUNTER — Other Ambulatory Visit: Payer: Self-pay

## 2023-08-18 ENCOUNTER — Emergency Department

## 2023-08-18 DIAGNOSIS — S59902A Unspecified injury of left elbow, initial encounter: Secondary | ICD-10-CM | POA: Diagnosis present

## 2023-08-18 DIAGNOSIS — F0390 Unspecified dementia without behavioral disturbance: Secondary | ICD-10-CM | POA: Diagnosis not present

## 2023-08-18 DIAGNOSIS — S0083XA Contusion of other part of head, initial encounter: Secondary | ICD-10-CM | POA: Insufficient documentation

## 2023-08-18 DIAGNOSIS — G20A1 Parkinson's disease without dyskinesia, without mention of fluctuations: Secondary | ICD-10-CM | POA: Diagnosis not present

## 2023-08-18 DIAGNOSIS — S51012A Laceration without foreign body of left elbow, initial encounter: Secondary | ICD-10-CM | POA: Insufficient documentation

## 2023-08-18 DIAGNOSIS — S0990XA Unspecified injury of head, initial encounter: Secondary | ICD-10-CM

## 2023-08-18 DIAGNOSIS — W19XXXA Unspecified fall, initial encounter: Secondary | ICD-10-CM | POA: Diagnosis not present

## 2023-08-18 NOTE — ED Notes (Signed)
Patient was incontinent to urine. Linens and clothes were changed. Patient was changed into brief, paper scrub pants and ED gown. Wound on right elbow was redressed with 4x4 gauze, Cobain and mesh stocking. Patient tolerated activity well.

## 2023-08-18 NOTE — Discharge Instructions (Signed)
Use Tylenol for pain and fevers.  Up to 1000 mg per dose, up to 4 times per day.  Do not take more than 4000 mg of Tylenol/acetaminophen within 24 hours.Marland Kitchen

## 2023-08-18 NOTE — ED Provider Notes (Signed)
[facility] Provider Note    Event Date/Time   First MD Initiated Contact with Patient 08/18/23 [phone]     (approximate)   History   Fall   HPI  Corey Morrison is a 67 y.o. male who presents to the ED for evaluation of Fall   Review of medical DC summary from 8/25.  History of [name]'s disease, dementia, orthostasis, paroxysmal A-fib and recurrent falls.  He was admitted for aspiration pneumonia.  He is no longer on anticoagulation due to frequent falls.  Patient presents to the ED after another fall.  He is unable to provide any relevant history due to his dementia, apparently at baseline.  No other reported concerns   Physical Exam   Triage Vital Signs: ED Triage Vitals  Encounter Vitals Group     BP 08/18/23 0438 115/65     Systolic BP Percentile --      Diastolic BP Percentile --      Pulse Rate 08/18/23 0438 70     Resp 08/18/23 0438 16     Temp 08/18/23 0438 97.9 F (36.6 C)     Temp Source 08/18/23 0438 Axillary     SpO2 08/18/23 0432 99 %     Weight --      Height --      Head Circumference --      Peak Flow --      Pain Score 08/18/23 0439 0     Pain Loc --      Pain Education --      Exclude from Growth Chart --     Most recent vital signs: Vitals:   08/18/23 0432 08/18/23 0438  BP:  115/65  Pulse:  70  Resp:  16  Temp:  97.9 F (36.6 C)  SpO2: 99% 97%    General: Awake, no distress.  Small hematoma to left-sided forehead.  Small skin tear to the left elbow with intact range of motion without apparent pain. CV:  Good peripheral perfusion.  Resp:  Normal effort.  Abd:  No distention.  Soft MSK:  No deformity noted.  Palpation of all 4 extremities without evidence deformity, tenderness, impaired range of motion.  On the seat skin tear in the left elbow acutely Neuro:  No focal deficits appreciated. Other:     ED Results / Procedures / Treatments   Labs (all labs ordered are listed, but only abnormal results are  displayed) Labs Reviewed - No data to display  EKG   RADIOLOGY CT head interpreted by me without evidence of acute intracranial pathology CT cervical spine interpreted by me without evidence of fracture or dislocation  Official radiology report(s): CT Head Wo Contrast  Result Date: 08/18/2023 CLINICAL DATA:  67 year old male with history of blunt facial trauma from an unwitnessed fall. Head and neck pain. EXAM: CT HEAD WITHOUT CONTRAST CT CERVICAL SPINE WITHOUT CONTRAST TECHNIQUE: Multidetector CT imaging of the head and cervical spine was performed following the standard protocol without intravenous contrast. Multiplanar CT image reconstructions of the cervical spine were also generated. RADIATION DOSE REDUCTION: This exam was performed according to the departmental dose-optimization program which includes automated exposure control, adjustment of the mA and/or kV according to patient size and/or use of iterative reconstruction technique. COMPARISON:  CT of the head and cervical spine 08/10/2023. FINDINGS: CT HEAD FINDINGS Brain: No evidence of acute infarction, hemorrhage, hydrocephalus, extra-axial collection or mass lesion/mass effect. Vascular: No hyperdense vessel or unexpected calcification. Skull: Normal. Negative for  fracture or focal lesion. Sinuses/Orbits: No acute finding. Other: Small amount of high attenuation soft tissue swelling in the left frontal scalp, compatible with a small scalp hematoma. CT CERVICAL SPINE FINDINGS Alignment: Reversal of normal cervical lordosis centered at the level of C5-C6 where there are advanced degenerative changes in appearance chronic fusion of the vertebral bodies. Alignment is otherwise anatomic. Skull base and vertebrae: No acute fracture. Probable chronic compression of anterior C5 vertebral body with proximally 25% loss of anterior vertebral body height. No primary bone lesion or focal pathologic process. Soft tissues and spinal canal: No prevertebral  fluid or swelling. No visible canal hematoma. Disc levels: Multilevel degenerative disc disease, most severe at C4-C5, C5-C6 and C6-C7. Complete bony fusion of C5-C6 is noted. Upper chest: Unremarkable. Other: None. IMPRESSION: 1. Small left frontal scalp hematoma. No evidence of significant acute traumatic injury to the skull, brain or cervical spine. 2. The appearance of the brain is normal for age. 3. Severe multilevel degenerative disc disease and cervical spondylosis, similar to the prior study, as above. Electronically Signed   By: Trudie Reed M.D.   On: 08/18/2023 05:34   CT Cervical Spine Wo Contrast  Result Date: 08/18/2023 CLINICAL DATA:  67 year old male with history of blunt facial trauma from an unwitnessed fall. Head and neck pain. EXAM: CT HEAD WITHOUT CONTRAST CT CERVICAL SPINE WITHOUT CONTRAST TECHNIQUE: Multidetector CT imaging of the head and cervical spine was performed following the standard protocol without intravenous contrast. Multiplanar CT image reconstructions of the cervical spine were also generated. RADIATION DOSE REDUCTION: This exam was performed according to the departmental dose-optimization program which includes automated exposure control, adjustment of the mA and/or kV according to patient size and/or use of iterative reconstruction technique. COMPARISON:  CT of the head and cervical spine 08/10/2023. FINDINGS: CT HEAD FINDINGS Brain: No evidence of acute infarction, hemorrhage, hydrocephalus, extra-axial collection or mass lesion/mass effect. Vascular: No hyperdense vessel or unexpected calcification. Skull: Normal. Negative for fracture or focal lesion. Sinuses/Orbits: No acute finding. Other: Small amount of high attenuation soft tissue swelling in the left frontal scalp, compatible with a small scalp hematoma. CT CERVICAL SPINE FINDINGS Alignment: Reversal of normal cervical lordosis centered at the level of C5-C6 where there are advanced degenerative changes in  appearance chronic fusion of the vertebral bodies. Alignment is otherwise anatomic. Skull base and vertebrae: No acute fracture. Probable chronic compression of anterior C5 vertebral body with proximally 25% loss of anterior vertebral body height. No primary bone lesion or focal pathologic process. Soft tissues and spinal canal: No prevertebral fluid or swelling. No visible canal hematoma. Disc levels: Multilevel degenerative disc disease, most severe at C4-C5, C5-C6 and C6-C7. Complete bony fusion of C5-C6 is noted. Upper chest: Unremarkable. Other: None. IMPRESSION: 1. Small left frontal scalp hematoma. No evidence of significant acute traumatic injury to the skull, brain or cervical spine. 2. The appearance of the brain is normal for age. 3. Severe multilevel degenerative disc disease and cervical spondylosis, similar to the prior study, as above. Electronically Signed   By: Trudie Reed M.D.   On: 08/18/2023 05:34    PROCEDURES and INTERVENTIONS:  Procedures  Medications - No data to display   IMPRESSION / MDM / ASSESSMENT AND PLAN / ED COURSE  I reviewed the triage vital signs and the nursing notes.  Differential diagnosis includes, but is not limited to, skull fracture, ICH, stroke, elbow dislocation  {Patient presents with symptoms of an acute illness or injury that is potentially  life-threatening.  Pleasantly disoriented patient presents after another fall without evidence of acute traumatic pathology and suitable for outpatient management.  Reassuring imaging, vital signs and clinical picture.  Suitable for return to facility      FINAL CLINICAL IMPRESSION(S) / ED DIAGNOSES   Final diagnoses:  Fall, initial encounter  Injury of head, initial encounter     Rx / DC Orders   ED Discharge Orders     None        Note:  This document was prepared using Dragon voice recognition software and may include unintentional dictation errors.   Delton Prairie, MD 08/18/23  [phone]

## 2023-08-18 NOTE — ED Triage Notes (Addendum)
EMS brings pt in from [facility] for unwitnessed fall; pt with hx dementia and parkinsons; ambulatory with assistance; skin tear to left elbow; scab and redness noted to left side forehead (appears to be from prev visit/fall per chart); pt denies any c/o at present

## 2023-08-20 ENCOUNTER — Emergency Department

## 2023-08-20 ENCOUNTER — Emergency Department
Admission: EM | Admit: 2023-08-20 | Discharge: 2023-08-20 | Disposition: A | Discharge status: Home / Self Care | Attending: Emergency Medicine | Admitting: Emergency Medicine

## 2023-08-20 ENCOUNTER — Other Ambulatory Visit: Payer: Self-pay

## 2023-08-20 DIAGNOSIS — S0990XA Unspecified injury of head, initial encounter: Secondary | ICD-10-CM | POA: Diagnosis present

## 2023-08-20 DIAGNOSIS — F0390 Unspecified dementia without behavioral disturbance: Secondary | ICD-10-CM | POA: Insufficient documentation

## 2023-08-20 DIAGNOSIS — W19XXXA Unspecified fall, initial encounter: Secondary | ICD-10-CM | POA: Diagnosis not present

## 2023-08-20 DIAGNOSIS — S0001XA Abrasion of scalp, initial encounter: Secondary | ICD-10-CM | POA: Diagnosis not present

## 2023-08-20 DIAGNOSIS — G20C Parkinsonism, unspecified: Secondary | ICD-10-CM | POA: Insufficient documentation

## 2023-08-20 LAB — COMPREHENSIVE METABOLIC PANEL
ALT: 7 U/L (ref 0–44)
AST: 18 U/L (ref 15–41)
Albumin: 3.9 g/dL (ref 3.5–5.0)
Alkaline Phosphatase: 66 U/L (ref 38–126)
Anion gap: 10 (ref 5–15)
BUN: 30 mg/dL — ABNORMAL HIGH (ref 8–23)
CO2: 23 mmol/L (ref 22–32)
Calcium: 8.7 mg/dL — ABNORMAL LOW (ref 8.9–10.3)
Chloride: 105 mmol/L (ref 98–111)
Creatinine, Ser: 0.9 mg/dL (ref 0.61–1.24)
GFR, Estimated: >60 mL/min (ref >60)
Glucose, Bld: 119 mg/dL — ABNORMAL HIGH (ref 70–99)
Potassium: 3.8 mmol/L (ref 3.5–5.1)
Sodium: 138 mmol/L (ref 135–145)
Total Bilirubin: 1.1 mg/dL (ref 0.3–1.2)
Total Protein: 7.3 g/dL (ref 6.5–8.1)

## 2023-08-20 LAB — CBC WITH DIFFERENTIAL/PLATELET
Abs Immature Granulocytes: 0.05 10*3/uL (ref 0.00–0.07)
Basophils Absolute: 0 10*3/uL (ref 0.0–0.1)
Basophils Relative: 0 %
Eosinophils Absolute: 0.2 10*3/uL (ref 0.0–0.5)
Eosinophils Relative: 2 %
HCT: 32.2 % — ABNORMAL LOW (ref 39.0–52.0)
Hemoglobin: 10.5 g/dL — ABNORMAL LOW (ref 13.0–17.0)
Immature Granulocytes: 1 %
Lymphocytes Relative: 7 %
Lymphs Abs: 0.6 10*3/uL — ABNORMAL LOW (ref 0.7–4.0)
MCH: 30.3 pg (ref 26.0–34.0)
MCHC: 32.6 g/dL (ref 30.0–36.0)
MCV: 92.8 fL (ref 80.0–100.0)
Monocytes Absolute: 0.5 10*3/uL (ref 0.1–1.0)
Monocytes Relative: 5 %
Neutro Abs: 7.6 10*3/uL (ref 1.7–7.7)
Neutrophils Relative %: 85 %
Platelets: 147 10*3/uL — ABNORMAL LOW (ref 150–400)
RBC: 3.47 MIL/uL — ABNORMAL LOW (ref 4.22–5.81)
RDW: 13.3 % (ref 11.5–15.5)
WBC: 9 10*3/uL (ref 4.0–10.5)
nRBC: 0 % (ref 0.0–0.2)

## 2023-08-20 LAB — URINALYSIS, ROUTINE W REFLEX MICROSCOPIC
Bilirubin Urine: NEGATIVE
Glucose, UA: 50 mg/dL — AB
Hgb urine dipstick: NEGATIVE
Ketones, ur: 80 mg/dL — AB
Leukocytes,Ua: NEGATIVE
Nitrite: NEGATIVE
Protein, ur: NEGATIVE mg/dL
Specific Gravity, Urine: 1.024 (ref 1.005–1.030)
pH: 5 (ref 5.0–8.0)

## 2023-08-20 MED ORDER — LIDOCAINE-EPINEPHRINE 2 %-1:100000 IJ SOLN
20.0000 mL | Freq: Once | INTRAMUSCULAR | Status: DC
  Filled 2023-08-20: qty 1

## 2023-08-20 NOTE — ED Notes (Signed)
Attempt to call report to the [facility] [place]. No answer.

## 2023-08-20 NOTE — ED Notes (Signed)
Pt given gingerale to help facilitate spontaneous urination. No difficulties swallowing.

## 2023-08-20 NOTE — ED Triage Notes (Signed)
Pt arrives via [facility] from the [facility] [place] for fall x2. Per staff report to EMS, pt is not responding normally and normally knows his name. EMS reports he would not answer any of their questions en route. Pt alert with multiple bruises on torso and laceration to forehead. Pt moving all extremities.  Pt able to tell us his name.

## 2023-08-20 NOTE — ED Notes (Signed)
Dr Larinda Buttery at bedside for laceration repair

## 2023-08-20 NOTE — ED Provider Notes (Signed)
[facility] Provider Note    Event Date/Time   First MD Initiated Contact with Patient 08/20/23 [phone]     (approximate)   History   Chief Complaint Fall   HPI  Corey Morrison is a 67 y.o. male with past medical history of [name]'s disease, atrial fibrillation, dementia, and frequent falls who presents to the ED following fall.  Per EMS, patient had multiple falls this morning at his nursing facility, struck his head but it is unclear whether he lost consciousness.  Patient reportedly at his baseline mental status per staff at the facility and he currently denies any complaints, although he is unable to describe the falls and what led to them.  He does have a laceration to his frontal scalp, denies headache or neck pain.  He does not take any blood thinners.     Physical Exam   Triage Vital Signs: ED Triage Vitals [08/20/23 0712]  Encounter Vitals Group     BP 111/68     Systolic BP Percentile      Diastolic BP Percentile      Pulse Rate 67     Resp 16     Temp (!) 97.4 F (36.3 C)     Temp Source Oral     SpO2 99 %     Weight      Height      Head Circumference      Peak Flow      Pain Score      Pain Loc      Pain Education      Exclude from Growth Chart     Most recent vital signs: Vitals:   08/20/23 0730 08/20/23 0930  BP: 100/66 128/70  Pulse: 66 81  Resp: 18 (!) 23  Temp:    SpO2: 97% 99%    Constitutional: Alert and oriented to person and place, but not time or situation. Eyes: Conjunctivae are normal. Head: Frontal scalp laceration. Nose: No congestion/rhinnorhea. Mouth/Throat: Mucous membranes are moist.  Neck: No midline cervical spine tenderness to palpation. Cardiovascular: Normal rate, regular rhythm. Grossly normal heart sounds.  2+ radial pulses bilaterally. Respiratory: Normal respiratory effort.  No retractions. Lungs CTAB.  No chest wall tenderness to palpation. Gastrointestinal: Soft and nontender. No  distention. Musculoskeletal: No lower extremity tenderness nor edema.  No upper extremity bony tenderness to palpation. Neurologic:  Normal speech and language. No gross focal neurologic deficits are appreciated.    ED Results / Procedures / Treatments   Labs (all labs ordered are listed, but only abnormal results are displayed) Labs Reviewed  CBC WITH DIFFERENTIAL/PLATELET - Abnormal; Notable for the following components:      Result Value   RBC 3.47 (*)    Hemoglobin 10.5 (*)    HCT 32.2 (*)    Platelets 147 (*)    Lymphs Abs 0.6 (*)    All other components within normal limits  COMPREHENSIVE METABOLIC PANEL - Abnormal; Notable for the following components:   Glucose, Bld 119 (*)    BUN 30 (*)    Calcium 8.7 (*)    All other components within normal limits  URINALYSIS, ROUTINE W REFLEX MICROSCOPIC     EKG  ED ECG REPORT I, Chesley Noon, the attending physician, personally viewed and interpreted this ECG.   Date: 08/20/2023  EKG Time: 7:21  Rate: 66  Rhythm: normal sinus rhythm  Axis: Normal  Intervals:left bundle branch block  ST&T Change: None  RADIOLOGY CT  head reviewed and interpreted by me with no hemorrhage or midline shift.  PROCEDURES:  Critical Care performed: No  Procedures   MEDICATIONS ORDERED IN ED: Medications  lidocaine-EPINEPHrine (XYLOCAINE W/EPI) 2 %-1:100000 (with pres) injection 20 mL (20 mLs Infiltration Not Given 08/20/23 0858)     IMPRESSION / MDM / ASSESSMENT AND PLAN / ED COURSE  I reviewed the triage vital signs and the nursing notes.                              67 y.o. male with past medical history of [name]'s disease, atrial fibrillation, dementia, and frequent falls who presents to the ED following multiple falls at his nursing facility this morning.  Patient's presentation is most consistent with acute presentation with potential threat to life or bodily function.  Differential diagnosis includes, but is not  limited to, head injury, cervical spine injury, arrhythmia, anemia, electrolyte abnormality, AKI, UTI.  Patient chronically ill but nontoxic-appearing and in no acute distress, vital signs are unremarkable.  He is alert and oriented to person and place, which seems to be his baseline, no focal neurologic deficits noted.  He does have a laceration to his frontal scalp, will check CT head and cervical spine.  No evidence of traumatic injury to his trunk or extremities, will screen labs and urinalysis.  CT head and cervical spine are negative for acute process, labs are reassuring with no significant anemia, leukocytosis, tract abnormality, or AKI.  LFTs are also unremarkable, patient unable to provide urine sample but no findings concerning for UTI at this time.  He is appropriate for discharge back to his nursing facility, with cleansing of his scalp wound this is a superficial abrasion not in need of repair.  He is at his baseline mental status with no complaints at this time, was counseled to return to the ED for new or worsening symptoms.  Patient agrees with plan.      FINAL CLINICAL IMPRESSION(S) / ED DIAGNOSES   Final diagnoses:  Fall, initial encounter  Abrasion of scalp, initial encounter     Rx / DC Orders   ED Discharge Orders     None        Note:  This document was prepared using Dragon voice recognition software and may include unintentional dictation errors.   Chesley Noon, MD 08/20/23 [phone]

## 2023-09-01 ENCOUNTER — Emergency Department

## 2023-09-01 ENCOUNTER — Other Ambulatory Visit: Payer: Self-pay

## 2023-09-01 ENCOUNTER — Emergency Department
Admission: EM | Admit: 2023-09-01 | Discharge: 2023-09-01 | Disposition: A | Discharge status: Home / Self Care | Attending: Emergency Medicine | Admitting: Emergency Medicine

## 2023-09-01 DIAGNOSIS — G20C Parkinsonism, unspecified: Secondary | ICD-10-CM | POA: Insufficient documentation

## 2023-09-01 DIAGNOSIS — I2510 Atherosclerotic heart disease of native coronary artery without angina pectoris: Secondary | ICD-10-CM | POA: Diagnosis not present

## 2023-09-01 DIAGNOSIS — R569 Unspecified convulsions: Secondary | ICD-10-CM | POA: Insufficient documentation

## 2023-09-01 DIAGNOSIS — F0390 Unspecified dementia without behavioral disturbance: Secondary | ICD-10-CM | POA: Diagnosis not present

## 2023-09-01 DIAGNOSIS — N390 Urinary tract infection, site not specified: Secondary | ICD-10-CM | POA: Insufficient documentation

## 2023-09-01 LAB — TSH: TSH: 3.911 u[IU]/mL (ref 0.350–4.500)

## 2023-09-01 LAB — MAGNESIUM: Magnesium: 2.1 mg/dL (ref 1.7–2.4)

## 2023-09-01 LAB — URINALYSIS, W/ REFLEX TO CULTURE (INFECTION SUSPECTED)
Bilirubin Urine: NEGATIVE
Glucose, UA: NEGATIVE mg/dL
Ketones, ur: 5 mg/dL — AB
Nitrite: NEGATIVE
Protein, ur: >=300 mg/dL — AB
RBC / HPF: >50 RBC/hpf (ref 0–5)
Specific Gravity, Urine: 1.019 (ref 1.005–1.030)
Squamous Epithelial / HPF: 0 /[HPF] (ref 0–5)
WBC, UA: >50 WBC/hpf (ref 0–5)
pH: 5 (ref 5.0–8.0)

## 2023-09-01 LAB — CBC
HCT: 32 % — ABNORMAL LOW (ref 39.0–52.0)
Hemoglobin: 10.5 g/dL — ABNORMAL LOW (ref 13.0–17.0)
MCH: 30 pg (ref 26.0–34.0)
MCHC: 32.8 g/dL (ref 30.0–36.0)
MCV: 91.4 fL (ref 80.0–100.0)
Platelets: 199 10*3/uL (ref 150–400)
RBC: 3.5 MIL/uL — ABNORMAL LOW (ref 4.22–5.81)
RDW: 13.2 % (ref 11.5–15.5)
WBC: 12.2 10*3/uL — ABNORMAL HIGH (ref 4.0–10.5)
nRBC: 0 % (ref 0.0–0.2)

## 2023-09-01 LAB — CBG MONITORING, ED
Glucose-Capillary: 68 mg/dL — ABNORMAL LOW (ref 70–99)
Glucose-Capillary: 112 mg/dL — ABNORMAL HIGH (ref 70–99)

## 2023-09-01 LAB — PHOSPHORUS: Phosphorus: 3.2 mg/dL (ref 2.5–4.6)

## 2023-09-01 LAB — T4, FREE: Free T4: 0.76 ng/dL (ref 0.61–1.12)

## 2023-09-01 LAB — BASIC METABOLIC PANEL
Anion gap: 10 (ref 5–15)
BUN: 26 mg/dL — ABNORMAL HIGH (ref 8–23)
CO2: 26 mmol/L (ref 22–32)
Calcium: 8.9 mg/dL (ref 8.9–10.3)
Chloride: 103 mmol/L (ref 98–111)
Creatinine, Ser: 0.87 mg/dL (ref 0.61–1.24)
GFR, Estimated: >60 mL/min (ref >60)
Glucose, Bld: 108 mg/dL — ABNORMAL HIGH (ref 70–99)
Potassium: 4 mmol/L (ref 3.5–5.1)
Sodium: 139 mmol/L (ref 135–145)

## 2023-09-01 LAB — TROPONIN I (HIGH SENSITIVITY): Troponin I (High Sensitivity): 3 ng/L (ref <18)

## 2023-09-01 LAB — CK: Total CK: 29 U/L — ABNORMAL LOW (ref 49–397)

## 2023-09-01 LAB — LACTIC ACID, PLASMA: Lactic Acid, Venous: 0.9 mmol/L (ref 0.5–1.9)

## 2023-09-01 MED ORDER — CEPHALEXIN 500 MG PO CAPS
500.0000 mg | ORAL_CAPSULE | Freq: Once | ORAL | Status: AC
  Administered 2023-09-01: 500 mg via ORAL
  Filled 2023-09-01: qty 1

## 2023-09-01 MED ORDER — CEPHALEXIN 500 MG PO CAPS: 500.0000 mg | ORAL_CAPSULE | Freq: Four times a day (QID) | ORAL | 0 refills | Status: AC

## 2023-09-01 NOTE — ED Notes (Signed)
Report called to [facility] at [facility].

## 2023-09-01 NOTE — ED Notes (Signed)
Seizures pads placed on rails x2

## 2023-09-01 NOTE — ED Triage Notes (Signed)
Pt BIB EMS from a SNF. Staff reported to EMS pt had 5 seizures, each lasting in duration.  No seizures with EMS. On EMS arrival to facility, pt was back to baseline.   Hx of parkinsons

## 2023-09-01 NOTE — ED Notes (Signed)
Report provided to [facility]. Pt stable at time of transport. Pt safely transferred to EMS stretcher. Pt belongings in possession of pt.  Discharge documents in possession of pt care giver with RX.

## 2023-09-01 NOTE — Discharge Instructions (Addendum)
Take antibiotics for urinary tract infection for the full 5-day course as prescribed.  Make sure you eat regular meals to keep your blood sugar from dropping low.  Thank you for choosing [place] for your health care today!  Please see your primary doctor this week for a follow up appointment.   If you have any new, worsening, or unexpected symptoms call your doctor right away or come back to the emergency department for reevaluation.  It was my pleasure to care for you today.   Daneil Dan Mod[place] Charon, MD

## 2023-09-01 NOTE — ED Notes (Signed)
Pt Blood sugar is 68, provider Mod[place] Charon made aware. No IV access at this time. Brought pt 2 apple juice packs. Pt able to drink both completely without difficulty.

## 2023-09-01 NOTE — ED Notes (Signed)
Provided urine incontinence care. Replaced bed pads and applied new depends brief. Pt repositioned, side rails up x2,  and call light placed back within reach. No further needs identified at this time.

## 2023-09-01 NOTE — ED Notes (Addendum)
ED Provider at bedside. Caregiver at bedside. Provided pt with 2 warm blankets

## 2023-09-01 NOTE — ED Provider Notes (Addendum)
[facility] Provider Note    Event Date/Time   First MD Initiated Contact with Patient 09/01/23 0143     (approximate)   History   Seizures   HPI  Corey Morrison is a 67 y.o. male   Past medical history of [name]'s, atrial fibrillation not on anticoagulation, CAD, hyperlipidemia, coming here from living facility due to seizure-like activity over the last 2 days.  Poor p.o. intake.  Reportedly transferring from bed to recliner so that they could change his pads, he stiffened up and had jerking motions.  Reportedly had a fall approximately 2 weeks ago with unknown head strike or loss of consciousness.  Mentation has been at baseline.  No other reported recent illnesses, fevers, and history is limited, from patient due to his baseline dementia.  Independent Historian contributed to assessment above: His health aide is at bedside who knows him, corroborates information past medical history as above       Physical Exam   Triage Vital Signs: ED Triage Vitals  Encounter Vitals Group     BP 09/01/23 0141 (!) 148/91     Systolic BP Percentile --      Diastolic BP Percentile --      Pulse Rate 09/01/23 0141 79     Resp 09/01/23 0141 12     Temp 09/01/23 0139 97.6 F (36.4 C)     Temp Source 09/01/23 0139 Oral     SpO2 09/01/23 0139 99 %     Weight 09/01/23 0142 148 lb 9.4 oz (67.4 kg)     Height 09/01/23 0142 5\' 9"  (1.753 m)     Head Circumference --      Peak Flow --      Pain Score 09/01/23 0142 0     Pain Loc --      Pain Education --      Exclude from Growth Chart --     Most recent vital signs: Vitals:   09/01/23 0230 09/01/23 0323  BP: 117/79 125/77  Pulse: (!) 28 77  Resp: 15 14  Temp:    SpO2: (!) 85% 100%    General: Awake, no distress.  CV:  Good peripheral perfusion.  Resp:  Normal effort.  Abd:  No distention. Other:  Moving all extremities, sensation intact, no facial asymmetry, disoriented reportedly at baseline  per aide at bedside.  Old bruise to the left side of the neck that the aide states is chronic older than 2 weeks, neck supple with full range of motion, no tenderness to deformity to the chest wall, back, abdomen.  Cogwheel rigidity to the arms bilaterally.   ED Results / Procedures / Treatments   Labs (all labs ordered are listed, but only abnormal results are displayed) Labs Reviewed  BASIC METABOLIC PANEL - Abnormal; Notable for the following components:      Result Value   Glucose, Bld 108 (*)    BUN 26 (*)    All other components within normal limits  CBC - Abnormal; Notable for the following components:   WBC 12.2 (*)    RBC 3.50 (*)    Hemoglobin 10.5 (*)    HCT 32.0 (*)    All other components within normal limits  URINALYSIS, W/ REFLEX TO CULTURE (INFECTION SUSPECTED) - Abnormal; Notable for the following components:   Color, Urine AMBER (*)    APPearance CLOUDY (*)    Hgb urine dipstick MODERATE (*)    Ketones, ur 5 (*)  Protein, ur >=300 (*)    Leukocytes,Ua LARGE (*)    Bacteria, UA MANY (*)    All other components within normal limits  CK - Abnormal; Notable for the following components:   Total CK 29 (*)    All other components within normal limits  CBG MONITORING, ED - Abnormal; Notable for the following components:   Glucose-Capillary 68 (*)    All other components within normal limits  CBG MONITORING, ED - Abnormal; Notable for the following components:   Glucose-Capillary 112 (*)    All other components within normal limits  URINE CULTURE  LACTIC ACID, PLASMA  TSH  T4, FREE  PHOSPHORUS  MAGNESIUM  TROPONIN I (HIGH SENSITIVITY)     I ordered and reviewed the above labs they are notable for initial blood glucose in the 60s, repeat 100s, inflammatory changes and bacteria in the urine.  EKG  ED ECG REPORT I, Pilar Jarvis, the attending physician, personally viewed and interpreted this ECG.   Date: 09/01/2023  EKG Time: 0142  Rate: 80  Rhythm:  sinus  Axis: nl  Intervals:lbbb   ST&T Change: no stemi    RADIOLOGY I independently reviewed and interpreted CT of the head see no obvious bleeding or midline shift I also reviewed radiologist's formal read.   PROCEDURES:  Critical Care performed: No  Procedures   MEDICATIONS ORDERED IN ED: Medications  cephALEXin (KEFLEX) capsule 500 mg (500 mg Oral Given 09/01/23 0326)     IMPRESSION / MDM / ASSESSMENT AND PLAN / ED COURSE  I reviewed the triage vital signs and the nursing notes.                                Patient's presentation is most consistent with acute presentation with potential threat to life or bodily function.  Differential diagnosis includes, but is not limited to, seizures, seizure-like activity, electrolyte derangement, infection, ICH, skull fracture   The patient is on the cardiac monitor to evaluate for evidence of arrhythmia and/or significant heart rate changes.  MDM:    Difficult to assess due to baseline dementia, reported seizure-like activity over the last several days and poor p.o. intake no history of seizures, obtain infectious and metabolic workup as well as a CT of the head.  Patient looks at his baseline now with no acute complaints no further seizure-like activity while here.  Found to be mildly hypoglycemic given oral intake and repeat blood sugars have been normal.  Ultimately workup showed urine infection for which I started him on Keflex and he will return to his facility and follow-up with PMD.       FINAL CLINICAL IMPRESSION(S) / ED DIAGNOSES   Final diagnoses:  Seizure-like activity (HCC)  Lower urinary tract infectious disease     Rx / DC Orders   ED Discharge Orders          Ordered    cephALEXin (KEFLEX) 500 MG capsule  4 times daily        09/01/23 9147             Note:  This document was prepared using Dragon voice recognition software and may include unintentional dictation errors.    Pilar Jarvis,  MD 09/01/23 8295    Pilar Jarvis, MD 09/01/23 [phone]

## 2023-09-03 LAB — URINE CULTURE: Culture: >=100000 — AB

## 2023-09-04 NOTE — Progress Notes (Signed)
ED Antimicrobial Stewardship Positive Culture Follow Up   Corey Morrison is an 67 y.o. male who presented to [facility] on 09/01/2023 with a chief complaint of possible seizure activity/alter mental status over the last 2 days. Also had poor PO intake and with mild hypoglycemia. UA concerning for infection. Patient discharged with Keflex 500 mg four times daily for 5 days and returned to the [facility] [place].   Chief Complaint  Patient presents with   Seizures    Recent Results (from the past 720 hour(s))  Urine Culture     Status: Abnormal   Collection Time: 09/01/23  2:03 AM   Specimen: Urine, Random  Result Value Ref Range Status   Specimen Description   Final    URINE, RANDOM Performed at [facility], [address], [place], [place] [address]    Special Requests   Final    NONE Reflexed from [id number] Performed at [facility], [address] [place], [place] [address]    Culture (A)  Final    >=100,000 COLONIES/mL ESCHERICHIA COLI Confirmed Extended Spectrum Beta-Lactamase Producer (ESBL).  In bloodstream infections from ESBL organisms, carbapenems are preferred over piperacillin/tazobactam. They are shown to have a lower risk of mortality.    Report Status 09/03/2023 FINAL  Final   Organism ID, Bacteria ESCHERICHIA COLI (A)  Final      Susceptibility   Escherichia coli - MIC*    AMPICILLIN >=32 RESISTANT Resistant     CEFAZOLIN >=64 RESISTANT Resistant     CEFEPIME 16 RESISTANT Resistant     CEFTRIAXONE >=64 RESISTANT Resistant     CIPROFLOXACIN 1 RESISTANT Resistant     GENTAMICIN >=16 RESISTANT Resistant     IMIPENEM <=0.25 SENSITIVE Sensitive     NITROFURANTOIN <=16 SENSITIVE Sensitive     TRIMETH/SULFA >=320 RESISTANT Resistant     AMPICILLIN/SULBACTAM 16 INTERMEDIATE Intermediate     PIP/TAZO <=4 SENSITIVE Sensitive     * >=100,000 COLONIES/mL ESCHERICHIA COLI    [x]  Treated with Keflex, organism resistant to prescribed antimicrobial []   Patient discharged originally without antimicrobial agent and treatment is now indicated  Called to the SNF Called the facility and spoke to [place]. She reported that the patient was on hospice care and will pass on the information to their hospice nurse for further action if needed. Faxed the ED culture report to the number provided: [phone]  New antibiotic prescription Recommended if the patients hasn't improved and requires further treatment to return to the hospital for IV antibiotics   ED Provider: Dr. Merleen Nicely ,PharmD PGY1 Pharmacy Resident 09/04/2023, 9:08 AM

## 2023-09-29 DEATH — deceased
